# Patient Record
Sex: Female | Born: 1955 | Race: White | Hispanic: No | Marital: Married | Smoking: Former smoker
Health system: Southern US, Community
[De-identification: ages and names within clinical notes are randomized; demographics above are authoritative.]

## PROBLEM LIST (undated history)

## (undated) DIAGNOSIS — C801 Malignant (primary) neoplasm, unspecified: Secondary | ICD-10-CM

## (undated) DIAGNOSIS — K219 Gastro-esophageal reflux disease without esophagitis: Secondary | ICD-10-CM

## (undated) DIAGNOSIS — Z8489 Family history of other specified conditions: Secondary | ICD-10-CM

## (undated) HISTORY — PX: BREAST LUMPECTOMY: SHX2

## (undated) HISTORY — PX: OTHER SURGICAL HISTORY: SHX169

## (undated) HISTORY — DX: Malignant (primary) neoplasm, unspecified: C80.1

## (undated) HISTORY — PX: GANGLION CYST EXCISION: SHX1691

## (undated) HISTORY — PX: CARPAL TUNNEL RELEASE: SHX101

## (undated) HISTORY — PX: CHOLECYSTECTOMY: SHX55

---

## 2015-02-19 DIAGNOSIS — R718 Other abnormality of red blood cells: Secondary | ICD-10-CM | POA: Insufficient documentation

## 2016-01-20 DIAGNOSIS — E66811 Obesity, class 1: Secondary | ICD-10-CM | POA: Insufficient documentation

## 2016-08-16 DIAGNOSIS — Z8719 Personal history of other diseases of the digestive system: Secondary | ICD-10-CM | POA: Insufficient documentation

## 2016-08-17 DIAGNOSIS — D849 Immunodeficiency, unspecified: Secondary | ICD-10-CM | POA: Insufficient documentation

## 2020-05-06 DIAGNOSIS — L409 Psoriasis, unspecified: Secondary | ICD-10-CM | POA: Insufficient documentation

## 2020-05-06 DIAGNOSIS — K219 Gastro-esophageal reflux disease without esophagitis: Secondary | ICD-10-CM | POA: Insufficient documentation

## 2020-05-07 ENCOUNTER — Other Ambulatory Visit: Payer: Self-pay | Admitting: Internal Medicine

## 2020-05-07 DIAGNOSIS — R7989 Other specified abnormal findings of blood chemistry: Secondary | ICD-10-CM

## 2020-05-07 DIAGNOSIS — R945 Abnormal results of liver function studies: Secondary | ICD-10-CM

## 2020-05-12 ENCOUNTER — Ambulatory Visit: Payer: BC Managed Care – PPO

## 2020-05-27 ENCOUNTER — Ambulatory Visit
Admission: RE | Admit: 2020-05-27 | Discharge: 2020-05-27 | Disposition: A | Discharge status: Home / Self Care | Payer: BC Managed Care – PPO | Source: Ambulatory Visit | Attending: Internal Medicine | Admitting: Internal Medicine

## 2020-05-27 ENCOUNTER — Other Ambulatory Visit: Payer: Self-pay

## 2020-05-27 DIAGNOSIS — R7989 Other specified abnormal findings of blood chemistry: Secondary | ICD-10-CM

## 2020-05-27 DIAGNOSIS — R945 Abnormal results of liver function studies: Secondary | ICD-10-CM | POA: Insufficient documentation

## 2020-06-22 ENCOUNTER — Other Ambulatory Visit: Payer: Self-pay | Admitting: Internal Medicine

## 2020-06-22 DIAGNOSIS — N6342 Unspecified lump in left breast, subareolar: Secondary | ICD-10-CM | POA: Diagnosis not present

## 2020-06-23 ENCOUNTER — Inpatient Hospital Stay
Admission: RE | Admit: 2020-06-23 | Discharge: 2020-06-23 | Disposition: A | Discharge status: Home / Self Care | Payer: Self-pay | Source: Ambulatory Visit | Attending: *Deleted | Admitting: *Deleted

## 2020-06-23 ENCOUNTER — Other Ambulatory Visit: Payer: Self-pay | Admitting: Internal Medicine

## 2020-06-23 ENCOUNTER — Other Ambulatory Visit: Payer: Self-pay | Admitting: *Deleted

## 2020-06-23 DIAGNOSIS — Z1231 Encounter for screening mammogram for malignant neoplasm of breast: Secondary | ICD-10-CM

## 2020-06-23 DIAGNOSIS — N6342 Unspecified lump in left breast, subareolar: Secondary | ICD-10-CM

## 2020-06-25 ENCOUNTER — Other Ambulatory Visit: Payer: Self-pay

## 2020-06-25 ENCOUNTER — Ambulatory Visit
Admission: RE | Admit: 2020-06-25 | Discharge: 2020-06-25 | Disposition: A | Discharge status: Home / Self Care | Payer: BC Managed Care – PPO | Source: Ambulatory Visit | Attending: Internal Medicine | Admitting: Internal Medicine

## 2020-06-25 DIAGNOSIS — N6342 Unspecified lump in left breast, subareolar: Secondary | ICD-10-CM

## 2020-06-25 DIAGNOSIS — N6321 Unspecified lump in the left breast, upper outer quadrant: Secondary | ICD-10-CM | POA: Diagnosis not present

## 2020-06-25 DIAGNOSIS — R922 Inconclusive mammogram: Secondary | ICD-10-CM | POA: Diagnosis not present

## 2020-06-25 DIAGNOSIS — N6322 Unspecified lump in the left breast, upper inner quadrant: Secondary | ICD-10-CM | POA: Diagnosis not present

## 2020-06-30 ENCOUNTER — Other Ambulatory Visit: Payer: Self-pay | Admitting: Internal Medicine

## 2020-06-30 DIAGNOSIS — Z86010 Personal history of colonic polyps: Secondary | ICD-10-CM | POA: Diagnosis not present

## 2020-06-30 DIAGNOSIS — R7989 Other specified abnormal findings of blood chemistry: Secondary | ICD-10-CM | POA: Diagnosis not present

## 2020-06-30 DIAGNOSIS — R928 Other abnormal and inconclusive findings on diagnostic imaging of breast: Secondary | ICD-10-CM

## 2020-06-30 DIAGNOSIS — R112 Nausea with vomiting, unspecified: Secondary | ICD-10-CM | POA: Diagnosis not present

## 2020-06-30 DIAGNOSIS — N6320 Unspecified lump in the left breast, unspecified quadrant: Secondary | ICD-10-CM

## 2020-06-30 DIAGNOSIS — R1013 Epigastric pain: Secondary | ICD-10-CM | POA: Diagnosis not present

## 2020-07-05 ENCOUNTER — Ambulatory Visit
Admission: RE | Admit: 2020-07-05 | Discharge: 2020-07-05 | Disposition: A | Discharge status: Home / Self Care | Payer: BC Managed Care – PPO | Source: Ambulatory Visit | Attending: Internal Medicine | Admitting: Internal Medicine

## 2020-07-05 ENCOUNTER — Other Ambulatory Visit: Payer: Self-pay | Admitting: Internal Medicine

## 2020-07-05 ENCOUNTER — Other Ambulatory Visit: Payer: Self-pay

## 2020-07-05 DIAGNOSIS — R599 Enlarged lymph nodes, unspecified: Secondary | ICD-10-CM | POA: Insufficient documentation

## 2020-07-05 DIAGNOSIS — N6321 Unspecified lump in the left breast, upper outer quadrant: Secondary | ICD-10-CM | POA: Diagnosis not present

## 2020-07-05 DIAGNOSIS — N6342 Unspecified lump in left breast, subareolar: Secondary | ICD-10-CM | POA: Diagnosis not present

## 2020-07-05 DIAGNOSIS — N6320 Unspecified lump in the left breast, unspecified quadrant: Secondary | ICD-10-CM | POA: Diagnosis not present

## 2020-07-05 DIAGNOSIS — R928 Other abnormal and inconclusive findings on diagnostic imaging of breast: Secondary | ICD-10-CM | POA: Insufficient documentation

## 2020-07-05 DIAGNOSIS — R590 Localized enlarged lymph nodes: Secondary | ICD-10-CM | POA: Diagnosis not present

## 2020-07-05 DIAGNOSIS — C50812 Malignant neoplasm of overlapping sites of left female breast: Secondary | ICD-10-CM | POA: Diagnosis not present

## 2020-07-05 DIAGNOSIS — I898 Other specified noninfective disorders of lymphatic vessels and lymph nodes: Secondary | ICD-10-CM | POA: Diagnosis not present

## 2020-07-05 HISTORY — PX: BREAST BIOPSY: SHX20

## 2020-07-06 ENCOUNTER — Encounter: Payer: Self-pay | Admitting: *Deleted

## 2020-07-06 ENCOUNTER — Other Ambulatory Visit: Payer: Self-pay | Admitting: *Deleted

## 2020-07-06 DIAGNOSIS — C50912 Malignant neoplasm of unspecified site of left female breast: Secondary | ICD-10-CM

## 2020-07-06 NOTE — Progress Notes (Signed)
Received message from Electa Sniff, RN at [facility] that patient had been informed of her new diagnosis of invasive left breast cancer. Called patient to establish navigation services.  Patient had already been referred to Dr. Peyton Najjar for 07/27/20.  I have moved her appointment to 07/08/20 @ 1:30 and scheduled a medical oncology consult for 07/12/20 @ 1:30 with Dr. Janese Banks.  Will educational material at her consult.  She was encouraged to call with any questions or needs.

## 2020-07-08 DIAGNOSIS — C50412 Malignant neoplasm of upper-outer quadrant of left female breast: Secondary | ICD-10-CM | POA: Diagnosis not present

## 2020-07-12 ENCOUNTER — Inpatient Hospital Stay: Payer: BC Managed Care – PPO | Attending: Oncology | Admitting: Oncology

## 2020-07-12 ENCOUNTER — Inpatient Hospital Stay: Payer: BC Managed Care – PPO

## 2020-07-12 ENCOUNTER — Encounter: Payer: Self-pay | Admitting: *Deleted

## 2020-07-12 ENCOUNTER — Encounter: Payer: Self-pay | Admitting: Oncology

## 2020-07-12 ENCOUNTER — Other Ambulatory Visit: Payer: Self-pay | Admitting: *Deleted

## 2020-07-12 VITALS — Temp 97.4°F | Resp 20 | Ht 62.0 in | Wt 187.7 lb

## 2020-07-12 DIAGNOSIS — Z9225 Personal history of immunosupression therapy: Secondary | ICD-10-CM | POA: Diagnosis not present

## 2020-07-12 DIAGNOSIS — Z298 Encounter for other specified prophylactic measures: Secondary | ICD-10-CM | POA: Diagnosis not present

## 2020-07-12 DIAGNOSIS — Z170 Estrogen receptor positive status [ER+]: Secondary | ICD-10-CM

## 2020-07-12 DIAGNOSIS — C50912 Malignant neoplasm of unspecified site of left female breast: Secondary | ICD-10-CM

## 2020-07-12 DIAGNOSIS — M654 Radial styloid tenosynovitis [de Quervain]: Secondary | ICD-10-CM | POA: Insufficient documentation

## 2020-07-12 DIAGNOSIS — Z5112 Encounter for antineoplastic immunotherapy: Secondary | ICD-10-CM | POA: Diagnosis not present

## 2020-07-12 DIAGNOSIS — Z79899 Other long term (current) drug therapy: Secondary | ICD-10-CM | POA: Diagnosis not present

## 2020-07-12 DIAGNOSIS — Z5111 Encounter for antineoplastic chemotherapy: Secondary | ICD-10-CM | POA: Diagnosis not present

## 2020-07-12 DIAGNOSIS — C50412 Malignant neoplasm of upper-outer quadrant of left female breast: Secondary | ICD-10-CM

## 2020-07-12 DIAGNOSIS — Z87891 Personal history of nicotine dependence: Secondary | ICD-10-CM

## 2020-07-12 DIAGNOSIS — Z7189 Other specified counseling: Secondary | ICD-10-CM

## 2020-07-12 DIAGNOSIS — Z5189 Encounter for other specified aftercare: Secondary | ICD-10-CM | POA: Diagnosis not present

## 2020-07-12 DIAGNOSIS — M25539 Pain in unspecified wrist: Secondary | ICD-10-CM | POA: Insufficient documentation

## 2020-07-12 LAB — SURGICAL PATHOLOGY

## 2020-07-12 MED ORDER — LORAZEPAM 0.5 MG PO TABS: 0.5000 mg | ORAL_TABLET | Freq: Four times a day (QID) | ORAL | 0 refills | Status: DC | PRN

## 2020-07-12 MED ORDER — ONDANSETRON HCL 8 MG PO TABS: 8.0000 mg | ORAL_TABLET | Freq: Two times a day (BID) | ORAL | 1 refills | Status: DC | PRN

## 2020-07-12 MED ORDER — DEXAMETHASONE 4 MG PO TABS: 8.0000 mg | ORAL_TABLET | Freq: Two times a day (BID) | ORAL | 1 refills | Status: DC

## 2020-07-12 MED ORDER — PROCHLORPERAZINE MALEATE 10 MG PO TABS: 10.0000 mg | ORAL_TABLET | Freq: Four times a day (QID) | ORAL | 1 refills | Status: DC | PRN

## 2020-07-12 MED ORDER — LIDOCAINE-PRILOCAINE 2.5-2.5 % EX CREA: TOPICAL_CREAM | CUTANEOUS | 3 refills | Status: DC

## 2020-07-12 NOTE — Progress Notes (Signed)
Patient here today for initial evaluation regarding breast cancer.  

## 2020-07-12 NOTE — Progress Notes (Signed)
START ON PATHWAY REGIMEN - Breast     A cycle is every 21 days:     Pertuzumab      Pertuzumab      Trastuzumab-xxxx      Trastuzumab-xxxx      Carboplatin      Docetaxel   **Always confirm dose/schedule in your pharmacy ordering system**  Patient Characteristics: Preoperative or Nonsurgical Candidate (Clinical Staging), Neoadjuvant Therapy followed by Surgery, Invasive Disease, Chemotherapy, HER2 Positive, ER Positive Therapeutic Status: Preoperative or Nonsurgical Candidate (Clinical Staging) AJCC M Category: cM0 AJCC Grade: G3 Breast Surgical Plan: Neoadjuvant Therapy followed by Surgery ER Status: Positive (+) AJCC 8 Stage Grouping: IIA HER2 Status: Positive (+) AJCC T Category: cT2 AJCC N Category: cN0 PR Status: Negative (-) Intent of Therapy: Curative Intent, Discussed with Patient 

## 2020-07-12 NOTE — Progress Notes (Signed)
Met patient and her husband today during her initial medical oncology consult with Dr. Janese Banks.  Dr. Janese Banks discussed neoadjuvant chemotherapy with Taxotere/Carbo/Herceptin/Perjeta.  Patient given breast cancer educational literature, "My Breast Cancer Treatment Handbook" by Josephine Igo, RN at her appointment with Dr. Peyton Najjar.  Encouraged patient to call with any questions or needs.  She is going to [place] this weekend and to start treatment on 07/23/20.

## 2020-07-12 NOTE — Progress Notes (Unsigned)
Mri breas

## 2020-07-12 NOTE — Progress Notes (Addendum)
Hematology/Oncology Consult note [facility] Telephone:([phone] [phone]  Patient Care Team: Kirk Ruths, MD as PCP - General (Internal Medicine) Rico Junker, RN as Oncology Nurse Navigator   Name of the patient: Wendy Caldwell  [id number]  05/26/1955    Reason for referral-new diagnosis of left breast cancer clinical prognostic stage II acT2 N0 M0 ER weakly positive, PR negative HER2 positive   Referring physician-Dr. Ouida Sills  Date of visit: 07/12/20   History of presenting illness- Patient is a 65 year old female with a past medical history significant for psoriasis for which she is on Humira.  She self palpated a left breast mass which prompted a bilateral diagnostic mammogram on 06/25/2020.  That showed an irregular hypoechoic mass 2.5 x 1.7 x 1.8 cm at 1 o'clock position 1 cm from the nipple.  3 cm from the nipple were benign cysts.  1 mildly enlarged left axillary lymph node with cortical thickening 4 mm.  Both the breast mass and the lymph node were biopsied.  Left breast biopsy was consistent with invasive mammary carcinoma grade 3 ER weakly +1 to 10%, PR negative and HER2 positive by IHC.  Patient is here to discuss further management.  She has already seen Dr. Peyton Najjar and options of both lumpectomy and mastectomy have been discussed with her.  Patient does not know her family history as she was adopted.  She married 2 months ago.  Menarche at the age of 34.  She is to adult daughters.  First child in her 39s.  She was previously using birth control.  Last menstrual period in her mid 64s.  No use of hormonal birth placement therapy.  She has had a breast biopsy years ago for right breast cysts.  ECOG PS- 0  Pain scale- 0   Review of systems- Review of Systems  Constitutional: Negative for chills, fever, malaise/fatigue and weight loss.  HENT: Negative for congestion, ear discharge and nosebleeds.   Eyes: Negative for  blurred vision.  Respiratory: Negative for cough, hemoptysis, sputum production, shortness of breath and wheezing.   Cardiovascular: Negative for chest pain, palpitations, orthopnea and claudication.  Gastrointestinal: Negative for abdominal pain, blood in stool, constipation, diarrhea, heartburn, melena, nausea and vomiting.  Genitourinary: Negative for dysuria, flank pain, frequency, hematuria and urgency.  Musculoskeletal: Negative for back pain, joint pain and myalgias.  Skin: Negative for rash.  Neurological: Negative for dizziness, tingling, focal weakness, seizures, weakness and headaches.  Endo/Heme/Allergies: Does not bruise/bleed easily.  Psychiatric/Behavioral: Negative for depression and suicidal ideas. The patient does not have insomnia.     No Known Allergies  Patient Active Problem List   Diagnosis Date Noted  . Wrist joint pain 07/12/2020  . Radial styloid tenosynovitis 07/12/2020  . Goals of care, counseling/discussion 07/12/2020  . Malignant neoplasm of upper-outer quadrant of left breast in female, estrogen receptor positive ([place]) 07/12/2020  . GERD (gastroesophageal reflux disease) 05/06/2020  . Psoriasis 05/06/2020  . Immunosuppression ([place]) 08/17/2016  . History of esophageal stricture 08/16/2016  . Severe obesity (BMI 35.0-39.9) with comorbidity ([place]) 01/20/2016     History reviewed. No pertinent past medical history.   Past Surgical History:  Procedure Laterality Date  . BREAST BIOPSY Left 07/05/2020   [place] Bx, Q clip, path pending  . BREAST BIOPSY Left 07/05/2020   [place] Bx, Vision, path pending   . CHOLECYSTECTOMY    . right ovary removal      Social History   Socioeconomic History  . Marital  status: Married    Spouse name: Not on file  . Number of children: Not on file  . Years of education: Not on file  . Highest education level: Not on file  Occupational History  . Not on file  Tobacco Use  . Smoking status: Former Smoker    Start date:  2012  . Smokeless tobacco: Not on file  Substance and Sexual Activity  . Alcohol use: Yes    Comment: occassional   . Drug use: Not on file  . Sexual activity: Not on file  Other Topics Concern  . Not on file  Social History Narrative  . Not on file   Social Determinants of Health   Financial Resource Strain: Not on file  Food Insecurity: Not on file  Transportation Needs: Not on file  Physical Activity: Not on file  Stress: Not on file  Social Connections: Not on file  Intimate Partner Violence: Not on file     Family History  Problem Relation Age of Onset  . Breast cancer Neg Hx      Current Outpatient Medications:  .  Adalimumab (HUMIRA) 40 MG/0.8ML PSKT, Humira 40 mg/0.8 mL subcutaneous syringe kit   40 mg every 2 weeks by sub-q route., Disp: , Rfl:  .  Ascorbic Acid (VITAMIN C) 1000 MG tablet, Take 1,000 mg by mouth daily., Disp: , Rfl:  .  loratadine (CLARITIN) 10 MG tablet, Take 10 mg by mouth daily., Disp: , Rfl:  .  Multiple Vitamins-Minerals (MULTIVITAMIN WITH MINERALS) tablet, Take 1 tablet by mouth daily., Disp: , Rfl:  .  omeprazole (PRILOSEC) 40 MG capsule, Take 40 mg by mouth daily., Disp: , Rfl:    Physical exam:  Vitals:   07/12/20 1353  Resp: 20  Temp: (!) 97.4 F (36.3 C)  TempSrc: Tympanic  Weight: 187 lb 11.2 oz (85.1 kg)   Physical Exam Constitutional:      General: She is not in acute distress. Cardiovascular:     Rate and Rhythm: Normal rate.     Heart sounds: Normal heart sounds.  Pulmonary:     Effort: Pulmonary effort is normal.  Abdominal:     General: Bowel sounds are normal.     Palpations: Abdomen is soft.  Skin:    General: Skin is warm and dry.  Neurological:     Mental Status: She is alert and oriented to person, place, and time.   Breast exam: Performed in sitting and laying down position.  Palpable left breast mass 1 cm from the nipple about 2 cm in size.  No palpable left axillary adenopathy.  Palpable left breast cyst  3 cm from the nipple.Left nipple retraction noted    No flowsheet data found. No flowsheet data found.  No images are attached to the encounter.  US BREAST LTD UNI LEFT INC AXILLA  Result Date: 06/25/2020 CLINICAL DATA:  Here for evaluation of a palpable area felt by the patient in the left breast. EXAM: DIGITAL DIAGNOSTIC BILATERAL MAMMOGRAM WITH TOMOSYNTHESIS AND CAD; ULTRASOUND LEFT BREAST LIMITED TECHNIQUE: Bilateral digital diagnostic mammography and breast tomosynthesis was performed. The images were evaluated with computer-aided detection.; Targeted ultrasound examination of the left breast was performed COMPARISON:  Previous exam(s). ACR Breast Density Category c: The breast tissue is heterogeneously dense, which may obscure small masses. FINDINGS: Additional views including spot compression demonstrate an irregular 2.3 cm mass with associated microcalcifications in the upper outer retroareolar left breast at anterior depth. This underlies a palpable skin marker.  The mass extends to the base of the nipple in there is associated nipple retraction. Three adjacent 4 mm masses are seen in the upper inner left breast at anterior depth. No suspicious mass, microcalcification, or other finding is identified in the right breast. Targeted left breast ultrasound was performed. At 1 o'clock 1 cm from the nipple an irregular hypoechoic mass measures 2.5 x 1.7 x 1.8 cm. This corresponds to the palpable area felt by the patient and the mass seen on the mammogram. This mass extends to the base of the nipple and there is nipple retraction on physical exam. At 10:30 o'clock 3 cm from the nipple three nearby benign cysts measure 3-4 mm in diameter and correspond to the 3 masses seen on the mammogram. One mildly enlarged left axillary lymph node demonstrates cortical thickening measuring 4 mm. IMPRESSION: 1. Irregular mass in the left breast and a mildly enlarged left axillary lymph node are suspicious for  malignancy and metastatic disease. 2.  No evidence of malignancy in the right breast. RECOMMENDATION: Recommend ultrasound-guided biopsy of the mass in the left breast and ultrasound-guided biopsy of the mildly enlarged lymph node in the left axilla. I have discussed the findings and recommendations with the patient. If applicable, a reminder letter will be sent to the patient regarding the next appointment. BI-RADS CATEGORY  4: Suspicious. Electronically Signed   By: Zerita Boers M.D.   On: 06/25/2020 11:54   MM DIAG BREAST TOMO BILATERAL  Result Date: 06/25/2020 CLINICAL DATA:  Here for evaluation of a palpable area felt by the patient in the left breast. EXAM: DIGITAL DIAGNOSTIC BILATERAL MAMMOGRAM WITH TOMOSYNTHESIS AND CAD; ULTRASOUND LEFT BREAST LIMITED TECHNIQUE: Bilateral digital diagnostic mammography and breast tomosynthesis was performed. The images were evaluated with computer-aided detection.; Targeted ultrasound examination of the left breast was performed COMPARISON:  Previous exam(s). ACR Breast Density Category c: The breast tissue is heterogeneously dense, which may obscure small masses. FINDINGS: Additional views including spot compression demonstrate an irregular 2.3 cm mass with associated microcalcifications in the upper outer retroareolar left breast at anterior depth. This underlies a palpable skin marker. The mass extends to the base of the nipple in there is associated nipple retraction. Three adjacent 4 mm masses are seen in the upper inner left breast at anterior depth. No suspicious mass, microcalcification, or other finding is identified in the right breast. Targeted left breast ultrasound was performed. At 1 o'clock 1 cm from the nipple an irregular hypoechoic mass measures 2.5 x 1.7 x 1.8 cm. This corresponds to the palpable area felt by the patient and the mass seen on the mammogram. This mass extends to the base of the nipple and there is nipple retraction on physical exam. At  10:30 o'clock 3 cm from the nipple three nearby benign cysts measure 3-4 mm in diameter and correspond to the 3 masses seen on the mammogram. One mildly enlarged left axillary lymph node demonstrates cortical thickening measuring 4 mm. IMPRESSION: 1. Irregular mass in the left breast and a mildly enlarged left axillary lymph node are suspicious for malignancy and metastatic disease. 2.  No evidence of malignancy in the right breast. RECOMMENDATION: Recommend ultrasound-guided biopsy of the mass in the left breast and ultrasound-guided biopsy of the mildly enlarged lymph node in the left axilla. I have discussed the findings and recommendations with the patient. If applicable, a reminder letter will be sent to the patient regarding the next appointment. BI-RADS CATEGORY  4: Suspicious. Electronically Signed   By:  Zerita Boers M.D.   On: 06/25/2020 11:54   MM CLIP PLACEMENT LEFT  Result Date: 07/05/2020 CLINICAL DATA:  Evaluate post biopsy marker clip placement following ultrasound-guided core needle biopsy of a retroareolar left breast mass and a left axillary lymph node. EXAM: DIAGNOSTIC LEFT MAMMOGRAM POST ULTRASOUND BIOPSY COMPARISON:  Previous exam(s). FINDINGS: Mammographic images were obtained following ultrasound guided biopsy of a retroareolar left breast mass and a left axillary lymph node. The Q shaped biopsy clip lies within the retroareolar mass. The vision clip, placed within the left axillary lymph node, cannot be visualized mammographically. IMPRESSION: Appropriate positioning of the type shaped biopsy marking clip at the site of biopsy in the Q retroareolar left breast. Final Assessment: Post Procedure Mammograms for Marker Placement Electronically Signed   By: Lajean Manes M.D.   On: 07/05/2020 09:06   [place] LT BREAST BX W LOC DEV 1ST LESION IMG BX SPEC US GUIDE  Addendum Date: 07/09/2020   ADDENDUM REPORT: 07/09/2020 12:48 ADDENDUM: PATHOLOGY revealed: Site A. LEFT BREAST; ULTRASOUND-GUIDED  BIOPSY: - INVASIVE MAMMARY CARCINOMA, NO SPECIAL TYPE. Size of invasive carcinoma: 13 mm in this sample. Grade 3. Ductal carcinoma in situ: Not identified. Lymphovascular invasion: Not identified. Comment: The definitive grade will be assigned on the excisional specimen. Pathology results are CONCORDANT with imaging findings, per Dr. Lajean Manes. PATHOLOGY revealed: Site B. LYMPH NODE, LEFT AXILLARY; ULTRASOUND-GUIDED BIOPSY: - CORES OF BENIGN LYMPH NODE, NEGATIVE FOR MALIGNANCY IN THIS SAMPLE. Pathology results are CONCORDANT with imaging findings, per Dr. Lajean Manes. Pathology results and recommendations below were discussed with patient by telephone on 07/06/2020. Patient reported biopsy site within normal limits with slight tenderness at the site. Post biopsy care instructions were reviewed, questions were answered and my direct phone number was provided to patient. Patient was instructed to call [facility] if any concerns or questions arise related to the biopsy. Recommend surgical consultation for Site A only (LEFT breast mass). Notified Al Pimple RN and Tanya Nones RN at [facility] on 07/06/2020, that patient requests her provider (Dr. Frazier Richards) make her surgical consultation. Pathology results reported by Electa Sniff RN on 07/09/2020. Electronically Signed   By: Lajean Manes M.D.   On: 07/09/2020 12:48   Result Date: 07/09/2020 CLINICAL DATA:  Patient presents for ultrasound-guided core needle biopsy of a retroareolar left breast mass and a single borderline abnormal left axillary lymph node. EXAM: ULTRASOUND GUIDED LEFT BREAST CORE NEEDLE BIOPSY ULTRASOUND GUIDED LEFT AXILLARY LYMPH NODE CORE NEEDLE BIOPSY COMPARISON:  Previous exam(s). PROCEDURE: I met with the patient and we discussed the procedure of ultrasound-guided biopsy, including benefits and alternatives. We discussed the high likelihood of a successful procedure. We discussed the risks of the  procedure, including infection, bleeding, tissue injury, clip migration, and inadequate sampling. Informed written consent was given. The usual time-out protocol was performed immediately prior to the procedure. Biopsy #1: 2.5 cm mass at 1 o'clock, 1 cm from the nipple. Lesion quadrant: Upper outer quadrant Using sterile technique and 1% Lidocaine as local anesthetic, under direct ultrasound visualization, a 12 gauge spring-loaded device was used to perform biopsy of retroareolar left breast mass using a medial approach. At the conclusion of the procedure Q shaped tissue marker clip was deployed into the biopsy cavity. Biopsy #2: Left axillary lymph node. Lesion location: Left axilla Using sterile technique and 1% Lidocaine as local anesthetic, under direct ultrasound visualization, a 14 gauge spring-loaded device was used to perform biopsy of the lymph  node with the 4 mm mildly thickened cortex using an inferior approach. At the conclusion of the procedure vision tissue marker clip was deployed into the biopsy cavity. Follow up 2 view mammogram was performed and dictated separately. IMPRESSION: Ultrasound guided biopsy of a retroareolar left breast mass and a single left axillary lymph node. No apparent complications. Electronically Signed: By: Lajean Manes M.D. On: 07/05/2020 09:11   [place] LT BREAST BX W LOC DEV EA ADD LESION IMG BX SPEC US GUIDE  Addendum Date: 07/09/2020   ADDENDUM REPORT: 07/09/2020 12:48 ADDENDUM: PATHOLOGY revealed: Site A. LEFT BREAST; ULTRASOUND-GUIDED BIOPSY: - INVASIVE MAMMARY CARCINOMA, NO SPECIAL TYPE. Size of invasive carcinoma: 13 mm in this sample. Grade 3. Ductal carcinoma in situ: Not identified. Lymphovascular invasion: Not identified. Comment: The definitive grade will be assigned on the excisional specimen. Pathology results are CONCORDANT with imaging findings, per Dr. Lajean Manes. PATHOLOGY revealed: Site B. LYMPH NODE, LEFT AXILLARY; ULTRASOUND-GUIDED BIOPSY: - CORES OF  BENIGN LYMPH NODE, NEGATIVE FOR MALIGNANCY IN THIS SAMPLE. Pathology results are CONCORDANT with imaging findings, per Dr. Lajean Manes. Pathology results and recommendations below were discussed with patient by telephone on 07/06/2020. Patient reported biopsy site within normal limits with slight tenderness at the site. Post biopsy care instructions were reviewed, questions were answered and my direct phone number was provided to patient. Patient was instructed to call [facility] if any concerns or questions arise related to the biopsy. Recommend surgical consultation for Site A only (LEFT breast mass). Notified Al Pimple RN and Tanya Nones RN at [facility] on 07/06/2020, that patient requests her provider (Dr. Frazier Richards) make her surgical consultation. Pathology results reported by Electa Sniff RN on 07/09/2020. Electronically Signed   By: Lajean Manes M.D.   On: 07/09/2020 12:48   Result Date: 07/09/2020 CLINICAL DATA:  Patient presents for ultrasound-guided core needle biopsy of a retroareolar left breast mass and a single borderline abnormal left axillary lymph node. EXAM: ULTRASOUND GUIDED LEFT BREAST CORE NEEDLE BIOPSY ULTRASOUND GUIDED LEFT AXILLARY LYMPH NODE CORE NEEDLE BIOPSY COMPARISON:  Previous exam(s). PROCEDURE: I met with the patient and we discussed the procedure of ultrasound-guided biopsy, including benefits and alternatives. We discussed the high likelihood of a successful procedure. We discussed the risks of the procedure, including infection, bleeding, tissue injury, clip migration, and inadequate sampling. Informed written consent was given. The usual time-out protocol was performed immediately prior to the procedure. Biopsy #1: 2.5 cm mass at 1 o'clock, 1 cm from the nipple. Lesion quadrant: Upper outer quadrant Using sterile technique and 1% Lidocaine as local anesthetic, under direct ultrasound visualization, a 12 gauge spring-loaded device was  used to perform biopsy of retroareolar left breast mass using a medial approach. At the conclusion of the procedure Q shaped tissue marker clip was deployed into the biopsy cavity. Biopsy #2: Left axillary lymph node. Lesion location: Left axilla Using sterile technique and 1% Lidocaine as local anesthetic, under direct ultrasound visualization, a 14 gauge spring-loaded device was used to perform biopsy of the lymph node with the 4 mm mildly thickened cortex using an inferior approach. At the conclusion of the procedure vision tissue marker clip was deployed into the biopsy cavity. Follow up 2 view mammogram was performed and dictated separately. IMPRESSION: Ultrasound guided biopsy of a retroareolar left breast mass and a single left axillary lymph node. No apparent complications. Electronically Signed: By: Lajean Manes M.D. On: 07/05/2020 09:11   MM Outside Films Mammo  Result Date: 06/29/2020 This examination belongs to an outside facility and is stored here for comparison purposes only.  Contact the originating outside institution for any associated report or interpretation.  MM Outside Films Mammo  Result Date: 06/29/2020 This examination belongs to an outside facility and is stored here for comparison purposes only.  Contact the originating outside institution for any associated report or interpretation.  MM Outside Films Mammo  Result Date: 06/29/2020 This examination belongs to an outside facility and is stored here for comparison purposes only.  Contact the originating outside institution for any associated report or interpretation.   Assessment and plan- Patient is a 65 y.o. female with newly diagnosed clinical prognostic stage IIa invasive mammary carcinoma of the left breast cT2 N0 M0 ER weakly +1 to 10%, PR negative and HER2 positive here to discuss further management  Discussed the results of the biopsy as well as mammogram and ultrasound with the patient in detail which showed a 2.5  cm left breast mass that was ER weakly positive, PR negative and HER2 positive.  There was 1 concerning left axillary lymph node which was biopsied proven negative for malignancy.  At this time I would like to get an MRI bilateral breasts with and without contrast to a certain the extent of the disease.  Given that her tumor is more than 2 cm and behaving more like ER/PR negative given the low ER positivity and HER2 positive-I would recommend neoadjuvant chemotherapy for her.  I recommend 6 cycles of neoadjuvant Taxotere along with carboplatin Herceptin and Perjeta given IV every 3 weeks for 6 cycles.  Discussed risks and benefits of chemotherapy including all but not limited to nausea, vomiting, low blood counts, risk of infections and hospitalizations.  Risk of hair loss, peripheral neuropathy and infusion reaction associated with carboplatin and docetaxel.  Discussed risks and benefits of Herceptin and Perjeta including all but not limited to diarrhea, skin rash and possible risk of cardiotoxicity.  Discussed the need to get a baseline echocardiogram prior to starting treatment.  Treatment will be given with a curative intent.  Patient understands and agrees to proceed as planned.  We will plan to get port placed by Dr. Peyton Najjar, chemo teach as well as echocardiogram and tentatively plan to start chemotherapy in 2 weeks.  Discussed that the goal of neoadjuvant chemotherapy would be to achieve a pathological complete response and if we do that she will continue Herceptin and Perjeta every 3 weeks after surgery for 1 year.  If we do not achieve a pathological complete response consideration will be given to switch her to Kadcyla every 3 weeks for a year.  Patient would also benefit from adjuvant radiation therapy upon completion of neoadjuvant chemotherapy and definitive surgery.  Given that her tumor is weakly ER positive there would be a role for hormone therapy down the line which I will discuss with  her in greater detail upon completion of radiation treatment.  I also asked the patient to speak to her rheumatologist/dermatologist about stopping Humira during chemotherapy for her psoriasis  Patient and her husband had multiple insightful questions and they were all answered to their satisfaction  Cancer Staging Malignant neoplasm of upper-outer quadrant of left breast in female, estrogen receptor positive ([place]) Staging form: Breast, AJCC 8th Edition - Clinical stage from 07/12/2020: Stage IIA (cT2, cN0, cM0, G3, ER+, PR-, HER2+) - Signed by Sindy Guadeloupe, MD on 07/12/2020 Stage prefix: Initial diagnosis Histologic grading system: 3 grade system  Thank you for this kind referral and the opportunity to participate in the care of this patient   Visit Diagnosis 1. Malignant neoplasm of upper-outer quadrant of left breast in female, estrogen receptor positive ([place])   2. High risk medication use   3. Goals of care, counseling/discussion     Dr. Randa Evens, MD, MPH [facility] [phone] 07/12/2020 4:30 PM

## 2020-07-13 ENCOUNTER — Encounter: Payer: Self-pay | Admitting: *Deleted

## 2020-07-13 ENCOUNTER — Encounter: Payer: Self-pay | Admitting: Licensed Clinical Social Worker

## 2020-07-13 NOTE — Progress Notes (Signed)
Patient wanted clarification of order for Decadron pre-chemo.  Reviewed ordered.  She questioned no p.o. Decadron the day of chemo.  Informed patient it usually given IV the day of chemo.  Encouraged her to confirm with Rosa at chemo class, who will have access to her treatment plan.  She is agreeable.

## 2020-07-14 NOTE — Patient Instructions (Signed)
Trastuzumab injection for infusion What is this medicine? TRASTUZUMAB (tras TOO zoo mab) is a monoclonal antibody. It is used to treat breast cancer and stomach cancer. This medicine may be used for other purposes; ask your health care provider or pharmacist if you have questions. COMMON BRAND NAME(S): Herceptin, Herzuma, KANJINTI, Ogivri, Ontruzant, Trazimera What should I tell my health care provider before I take this medicine? They need to know if you have any of these conditions:  heart disease  heart failure  lung or breathing disease, like asthma  an unusual or allergic reaction to trastuzumab, benzyl alcohol, or other medications, foods, dyes, or preservatives  pregnant or trying to get pregnant  breast-feeding How should I use this medicine? This drug is given as an infusion into a vein. It is administered in a hospital or clinic by a specially trained health care professional. Talk to your pediatrician regarding the use of this medicine in children. This medicine is not approved for use in children. Overdosage: If you think you have taken too much of this medicine contact a poison control center or emergency room at once. NOTE: This medicine is only for you. Do not share this medicine with others. What if I miss a dose? It is important not to miss a dose. Call your doctor or health care professional if you are unable to keep an appointment. What may interact with this medicine? This medicine may interact with the following medications:  certain types of chemotherapy, such as daunorubicin, doxorubicin, epirubicin, and idarubicin This list may not describe all possible interactions. Give your health care provider a list of all the medicines, herbs, non-prescription drugs, or dietary supplements you use. Also tell them if you smoke, drink alcohol, or use illegal drugs. Some items may interact with your medicine. What should I watch for while using this medicine? Visit your  doctor for checks on your progress. Report any side effects. Continue your course of treatment even though you feel ill unless your doctor tells you to stop. Call your doctor or health care professional for advice if you get a fever, chills or sore throat, or other symptoms of a cold or flu. Do not treat yourself. Try to avoid being around people who are sick. You may experience fever, chills and shaking during your first infusion. These effects are usually mild and can be treated with other medicines. Report any side effects during the infusion to your health care professional. Fever and chills usually do not happen with later infusions. Do not become pregnant while taking this medicine or for 7 months after stopping it. Women should inform their doctor if they wish to become pregnant or think they might be pregnant. Women of child-bearing potential will need to have a negative pregnancy test before starting this medicine. There is a potential for serious side effects to an unborn child. Talk to your health care professional or pharmacist for more information. Do not breast-feed an infant while taking this medicine or for 7 months after stopping it. Women must use effective birth control with this medicine. What side effects may I notice from receiving this medicine? Side effects that you should report to your doctor or health care professional as soon as possible:  allergic reactions like skin rash, itching or hives, swelling of the face, lips, or tongue  chest pain or palpitations  cough  dizziness  feeling faint or lightheaded, falls  fever  general ill feeling or flu-like symptoms  signs of worsening heart failure like   breathing problems; swelling in your legs and feet  unusually weak or tired Side effects that usually do not require medical attention (report to your doctor or health care professional if they continue or are bothersome):  bone pain  changes in  taste  diarrhea  joint pain  nausea/vomiting  weight loss This list may not describe all possible side effects. Call your doctor for medical advice about side effects. You may report side effects to FDA at 1-800-FDA-1088. Where should I keep my medicine? This drug is given in a hospital or clinic and will not be stored at home. NOTE: This sheet is a summary. It may not cover all possible information. If you have questions about this medicine, talk to your doctor, pharmacist, or health care provider.  2021 Elsevier/Gold Standard (2016-02-22 14:37:52) Pertuzumab injection What is this medicine? PERTUZUMAB (per TOOZ ue mab) is a monoclonal antibody. It is used to treat breast cancer. This medicine may be used for other purposes; ask your health care provider or pharmacist if you have questions. COMMON BRAND NAME(S): PERJETA What should I tell my health care provider before I take this medicine? They need to know if you have any of these conditions:  heart disease  heart failure  high blood pressure  history of irregular heart beat  recent or ongoing radiation therapy  an unusual or allergic reaction to pertuzumab, other medicines, foods, dyes, or preservatives  pregnant or trying to get pregnant  breast-feeding How should I use this medicine? This medicine is for infusion into a vein. It is given by a health care professional in a hospital or clinic setting. Talk to your pediatrician regarding the use of this medicine in children. Special care may be needed. Overdosage: If you think you have taken too much of this medicine contact a poison control center or emergency room at once. NOTE: This medicine is only for you. Do not share this medicine with others. What if I miss a dose? It is important not to miss your dose. Call your doctor or health care professional if you are unable to keep an appointment. What may interact with this medicine? Interactions are not  expected. Give your health care provider a list of all the medicines, herbs, non-prescription drugs, or dietary supplements you use. Also tell them if you smoke, drink alcohol, or use illegal drugs. Some items may interact with your medicine. This list may not describe all possible interactions. Give your health care provider a list of all the medicines, herbs, non-prescription drugs, or dietary supplements you use. Also tell them if you smoke, drink alcohol, or use illegal drugs. Some items may interact with your medicine. What should I watch for while using this medicine? Your condition will be monitored carefully while you are receiving this medicine. Report any side effects. Continue your course of treatment even though you feel ill unless your doctor tells you to stop. Do not become pregnant while taking this medicine or for 7 months after stopping it. Women should inform their doctor if they wish to become pregnant or think they might be pregnant. Women of child-bearing potential will need to have a negative pregnancy test before starting this medicine. There is a potential for serious side effects to an unborn child. Talk to your health care professional or pharmacist for more information. Do not breast-feed an infant while taking this medicine or for 7 months after stopping it. Women must use effective birth control with this medicine. Call your doctor or health  health care professional for advice if you get a fever, chills or sore throat, or other symptoms of a cold or flu. Do not treat yourself. Try to avoid being around people who are sick. You may experience fever, chills, and headache during the infusion. Report any side effects during the infusion to your health care professional. What side effects may I notice from receiving this medicine? Side effects that you should report to your doctor or health care professional as soon as possible:  breathing problems  chest pain or  palpitations  dizziness  feeling faint or lightheaded  fever or chills  skin rash, itching or hives  sore throat  swelling of the face, lips, or tongue  swelling of the legs or ankles  unusually weak or tired Side effects that usually do not require medical attention (report to your doctor or health care professional if they continue or are bothersome):  diarrhea  hair loss  nausea, vomiting  tiredness This list may not describe all possible side effects. Call your doctor for medical advice about side effects. You may report side effects to FDA at 1-800-FDA-1088. Where should I keep my medicine? This drug is given in a hospital or clinic and will not be stored at home. NOTE: This sheet is a summary. It may not cover all possible information. If you have questions about this medicine, talk to your doctor, pharmacist, or health care provider.  2021 Elsevier/Gold Standard (2015-04-01 12:08:50)  Docetaxel injection What is this medicine? DOCETAXEL (doe se TAX el) is a chemotherapy drug. It targets fast dividing cells, like cancer cells, and causes these cells to die. This medicine is used to treat many types of cancers like breast cancer, certain stomach cancers, head and neck cancer, lung cancer, and prostate cancer. This medicine may be used for other purposes; ask your health care provider or pharmacist if you have questions. COMMON BRAND NAME(S): Docefrez, Taxotere What should I tell my health care provider before I take this medicine? They need to know if you have any of these conditions:  infection (especially a virus infection such as chickenpox, cold sores, or herpes)  liver disease  low blood counts, like low white cell, platelet, or red cell counts  an unusual or allergic reaction to docetaxel, polysorbate 80, other chemotherapy agents, other medicines, foods, dyes, or preservatives  pregnant or trying to get pregnant  breast-feeding How should I use this  medicine? This drug is given as an infusion into a vein. It is administered in a hospital or clinic by a specially trained health care professional. Talk to your pediatrician regarding the use of this medicine in children. Special care may be needed. Overdosage: If you think you have taken too much of this medicine contact a poison control center or emergency room at once. NOTE: This medicine is only for you. Do not share this medicine with others. What if I miss a dose? It is important not to miss your dose. Call your doctor or health care professional if you are unable to keep an appointment. What may interact with this medicine? Do not take this medicine with any of the following medications:  live virus vaccines This medicine may also interact with the following medications:  aprepitant  certain antibiotics like erythromycin or clarithromycin  certain antivirals for HIV or hepatitis  certain medicines for fungal infections like fluconazole, itraconazole, ketoconazole, posaconazole, or voriconazole  cimetidine  ciprofloxacin  conivaptan  cyclosporine  dronedarone  fluvoxamine  grapefruit juice  imatinib    verapamil This list may not describe all possible interactions. Give your health care provider a list of all the medicines, herbs, non-prescription drugs, or dietary supplements you use. Also tell them if you smoke, drink alcohol, or use illegal drugs. Some items may interact with your medicine. What should I watch for while using this medicine? Your condition will be monitored carefully while you are receiving this medicine. You will need important blood work done while you are taking this medicine. Call your doctor or health care professional for advice if you get a fever, chills or sore throat, or other symptoms of a cold or flu. Do not treat yourself. This drug decreases your body's ability to fight infections. Try to avoid being around people who are sick. Some  products may contain alcohol. Ask your health care professional if this medicine contains alcohol. Be sure to tell all health care professionals you are taking this medicine. Certain medicines, like metronidazole and disulfiram, can cause an unpleasant reaction when taken with alcohol. The reaction includes flushing, headache, nausea, vomiting, sweating, and increased thirst. The reaction can last from 30 minutes to several hours. You may get drowsy or dizzy. Do not drive, use machinery, or do anything that needs mental alertness until you know how this medicine affects you. Do not stand or sit up quickly, especially if you are an older patient. This reduces the risk of dizzy or fainting spells. Alcohol may interfere with the effect of this medicine. Talk to your health care professional about your risk of cancer. You may be more at risk for certain types of cancer if you take this medicine. Do not become pregnant while taking this medicine or for 6 months after stopping it. Women should inform their doctor if they wish to become pregnant or think they might be pregnant. There is a potential for serious side effects to an unborn child. Talk to your health care professional or pharmacist for more information. Do not breast-feed an infant while taking this medicine or for 1 week after stopping it. Males who get this medicine must use a condom during sex with females who can get pregnant. If you get a woman pregnant, the baby could have birth defects. The baby could die before they are born. You will need to continue wearing a condom for 3 months after stopping the medicine. Tell your health care provider right away if your partner becomes pregnant while you are taking this medicine. This may interfere with the ability to father a child. You should talk to your doctor or health care professional if you are concerned about your fertility. What side effects may I notice from receiving this medicine? Side effects  that you should report to your doctor or health care professional as soon as possible:  allergic reactions like skin rash, itching or hives, swelling of the face, lips, or tongue  blurred vision  breathing problems  changes in vision  low blood counts - This drug may decrease the number of white blood cells, red blood cells and platelets. You may be at increased risk for infections and bleeding.  nausea and vomiting  pain, redness or irritation at site where injected  pain, tingling, numbness in the hands or feet  redness, blistering, peeling, or loosening of the skin, including inside the mouth  signs of decreased platelets or bleeding - bruising, pinpoint red spots on the skin, black, tarry stools, nosebleeds  signs of decreased red blood cells - unusually weak or tired, fainting spells, lightheadedness    signs of infection - fever or chills, cough, sore throat, pain or difficulty passing urine  swelling of the ankle, feet, hands Side effects that usually do not require medical attention (report to your doctor or health care professional if they continue or are bothersome):  constipation  diarrhea  fingernail or toenail changes  hair loss  loss of appetite  mouth sores  muscle pain This list may not describe all possible side effects. Call your doctor for medical advice about side effects. You may report side effects to FDA at 1-800-FDA-1088. Where should I keep my medicine? This drug is given in a hospital or clinic and will not be stored at home. NOTE: This sheet is a summary. It may not cover all possible information. If you have questions about this medicine, talk to your doctor, pharmacist, or health care provider.  2021 Elsevier/Gold Standard (2019-01-27 19:50:31)  Carboplatin injection What is this medicine? CARBOPLATIN (KAR boe pla tin) is a chemotherapy drug. It targets fast dividing cells, like cancer cells, and causes these cells to die. This medicine is  used to treat ovarian cancer and many other cancers. This medicine may be used for other purposes; ask your health care provider or pharmacist if you have questions. COMMON BRAND NAME(S): Paraplatin What should I tell my health care provider before I take this medicine? They need to know if you have any of these conditions:  blood disorders  hearing problems  kidney disease  recent or ongoing radiation therapy  an unusual or allergic reaction to carboplatin, cisplatin, other chemotherapy, other medicines, foods, dyes, or preservatives  pregnant or trying to get pregnant  breast-feeding How should I use this medicine? This drug is usually given as an infusion into a vein. It is administered in a hospital or clinic by a specially trained health care professional. Talk to your pediatrician regarding the use of this medicine in children. Special care may be needed. Overdosage: If you think you have taken too much of this medicine contact a poison control center or emergency room at once. NOTE: This medicine is only for you. Do not share this medicine with others. What if I miss a dose? It is important not to miss a dose. Call your doctor or health care professional if you are unable to keep an appointment. What may interact with this medicine?  medicines for seizures  medicines to increase blood counts like filgrastim, pegfilgrastim, sargramostim  some antibiotics like amikacin, gentamicin, neomycin, streptomycin, tobramycin  vaccines Talk to your doctor or health care professional before taking any of these medicines:  acetaminophen  aspirin  ibuprofen  ketoprofen  naproxen This list may not describe all possible interactions. Give your health care provider a list of all the medicines, herbs, non-prescription drugs, or dietary supplements you use. Also tell them if you smoke, drink alcohol, or use illegal drugs. Some items may interact with your medicine. What should I  watch for while using this medicine? Your condition will be monitored carefully while you are receiving this medicine. You will need important blood work done while you are taking this medicine. This drug may make you feel generally unwell. This is not uncommon, as chemotherapy can affect healthy cells as well as cancer cells. Report any side effects. Continue your course of treatment even though you feel ill unless your doctor tells you to stop. In some cases, you may be given additional medicines to help with side effects. Follow all directions for their use. Call your doctor or   health care professional for advice if you get a fever, chills or sore throat, or other symptoms of a cold or flu. Do not treat yourself. This drug decreases your body's ability to fight infections. Try to avoid being around people who are sick. This medicine may increase your risk to bruise or bleed. Call your doctor or health care professional if you notice any unusual bleeding. Be careful brushing and flossing your teeth or using a toothpick because you may get an infection or bleed more easily. If you have any dental work done, tell your dentist you are receiving this medicine. Avoid taking products that contain aspirin, acetaminophen, ibuprofen, naproxen, or ketoprofen unless instructed by your doctor. These medicines may hide a fever. Do not become pregnant while taking this medicine. Women should inform their doctor if they wish to become pregnant or think they might be pregnant. There is a potential for serious side effects to an unborn child. Talk to your health care professional or pharmacist for more information. Do not breast-feed an infant while taking this medicine. What side effects may I notice from receiving this medicine? Side effects that you should report to your doctor or health care professional as soon as possible:  allergic reactions like skin rash, itching or hives, swelling of the face, lips, or  tongue  signs of infection - fever or chills, cough, sore throat, pain or difficulty passing urine  signs of decreased platelets or bleeding - bruising, pinpoint red spots on the skin, black, tarry stools, nosebleeds  signs of decreased red blood cells - unusually weak or tired, fainting spells, lightheadedness  breathing problems  changes in hearing  changes in vision  chest pain  high blood pressure  low blood counts - This drug may decrease the number of white blood cells, red blood cells and platelets. You may be at increased risk for infections and bleeding.  nausea and vomiting  pain, swelling, redness or irritation at the injection site  pain, tingling, numbness in the hands or feet  problems with balance, talking, walking  trouble passing urine or change in the amount of urine Side effects that usually do not require medical attention (report to your doctor or health care professional if they continue or are bothersome):  hair loss  loss of appetite  metallic taste in the mouth or changes in taste This list may not describe all possible side effects. Call your doctor for medical advice about side effects. You may report side effects to FDA at 1-800-FDA-1088. Where should I keep my medicine? This drug is given in a hospital or clinic and will not be stored at home. NOTE: This sheet is a summary. It may not cover all possible information. If you have questions about this medicine, talk to your doctor, pharmacist, or health care provider.  2021 Elsevier/Gold Standard (2007-06-04 14:38:05)  

## 2020-07-15 ENCOUNTER — Inpatient Hospital Stay: Payer: BC Managed Care – PPO

## 2020-07-15 ENCOUNTER — Other Ambulatory Visit: Payer: Self-pay

## 2020-07-15 ENCOUNTER — Inpatient Hospital Stay (HOSPITAL_BASED_OUTPATIENT_CLINIC_OR_DEPARTMENT_OTHER): Payer: BC Managed Care – PPO | Admitting: Licensed Clinical Social Worker

## 2020-07-15 ENCOUNTER — Encounter: Payer: Self-pay | Admitting: Licensed Clinical Social Worker

## 2020-07-15 ENCOUNTER — Ambulatory Visit: Payer: Self-pay | Admitting: General Surgery

## 2020-07-15 DIAGNOSIS — C50412 Malignant neoplasm of upper-outer quadrant of left female breast: Secondary | ICD-10-CM

## 2020-07-15 DIAGNOSIS — Z170 Estrogen receptor positive status [ER+]: Secondary | ICD-10-CM

## 2020-07-15 NOTE — Progress Notes (Signed)
REFERRING PROVIDER: Sindy Guadeloupe, MD [place],  [place] [address]  PRIMARY PROVIDER:  Kirk Ruths, MD  PRIMARY REASON FOR VISIT:  1. Malignant neoplasm of upper-outer quadrant of left breast in female, estrogen receptor positive ([place])      HISTORY OF PRESENT ILLNESS:   [name], a 65 y.o. female, was seen for a [facility] cancer genetics consultation at the request of Dr. Janese Banks due to a personal history of breast cancer.  [name] presents to clinic today to discuss the possibility of a hereditary predisposition to cancer, genetic testing, and to further clarify her future cancer risks, as well as potential cancer risks for family members.   In 2022, at the age of 43, [name] was diagnosed with invasive mammary carcinoma of the left breast, ER+, PR-, Her2+. The treatment plan includes neoadjuvant chemotherapy, surgery and adjuvant radiation.    CANCER HISTORY:  Oncology History  Malignant neoplasm of upper-outer quadrant of left breast in female, estrogen receptor positive ([place])  07/12/2020 Initial Diagnosis   Malignant neoplasm of upper-outer quadrant of left breast in female, estrogen receptor positive ([place])   07/12/2020 Cancer Staging   Staging form: Breast, AJCC 8th Edition - Clinical stage from 07/12/2020: Stage IIA (cT2, cN0, cM0, G3, ER+, PR-, HER2+) - Signed by Sindy Guadeloupe, MD on 07/12/2020 Stage prefix: Initial diagnosis Histologic grading system: 3 grade system   07/13/2020 -  Chemotherapy    Patient is on Treatment Plan: BREAST  DOCETAXEL + CARBOPLATIN + TRASTUZUMAB + PERTUZUMAB  (TCHP) Q21D          RISK FACTORS:  Menarche was at age 40.  First live birth at age 58s.  Ovaries intact: has 1.  Hysterectomy: no.  Menopausal status: postmenopausal.  HRT use: 0 years. Colonoscopy: yes; normal. Mammogram within the last year: yes. Number of breast biopsies: 2.     Past Surgical History:  Procedure Laterality Date  . BREAST BIOPSY Left  07/05/2020   [place] Bx, Q clip, path pending  . BREAST BIOPSY Left 07/05/2020   [place] Bx, Vision, path pending   . CHOLECYSTECTOMY    . right ovary removal      Social History   Socioeconomic History  . Marital status: Married    Spouse name: Not on file  . Number of children: Not on file  . Years of education: Not on file  . Highest education level: Not on file  Occupational History  . Not on file  Tobacco Use  . Smoking status: Former Smoker    Start date: 2012  . Smokeless tobacco: Never Used  Substance and Sexual Activity  . Alcohol use: Yes    Comment: occassional   . Drug use: Not on file  . Sexual activity: Not on file  Other Topics Concern  . Not on file  Social History Narrative  . Not on file   Social Determinants of Health   Financial Resource Strain: Not on file  Food Insecurity: Not on file  Transportation Needs: Not on file  Physical Activity: Not on file  Stress: Not on file  Social Connections: Not on file     FAMILY HISTORY:  We obtained a detailed, 4-generation family history.  Significant diagnoses are listed below: Family History  Adopted: Yes  Problem Relation Age of Onset  . Breast cancer Neg Hx    Wendy Caldwell was adopted and has no information about her biological family. She has 1 daughter, 59, and 1  daughter, 47 but she does not have contact with this daughter.   Wendy Caldwell is unaware of previous family history of genetic testing for hereditary cancer risks. Patient's maternal ancestors are of is of unknown descent, and paternal ancestors are of unknown descent. There is no reported Ashkenazi Jewish ancestry. There is no known consanguinity.  GENETIC COUNSELING ASSESSMENT: Wendy Caldwell is a 65 y.o. female with a personal history of breast cancer which is somewhat suggestive of a hereditary cancer syndrome and predisposition to cancer. We, therefore, discussed and recommended the following at today's visit.   DISCUSSION: We discussed that  approximately 5-10% of breast cancer is hereditary  Most cases of hereditary breast cancer are associated with BRCA1/BRCA2 genes, although there are other genes associated with hereditary breast cancer as well as other cancers .  We discussed that testing is beneficial for several reasons including surgical decision-making for breast cancer, knowing about other cancer risks, identifying potential screening and risk-reduction options that may be appropriate, and to understand if other family members could be at risk for cancer and allow them to undergo genetic testing.   We reviewed the characteristics, features and inheritance patterns of hereditary cancer syndromes. We also discussed genetic testing, including the appropriate family members to test, the process of testing, insurance coverage and turn-around-time for results. We discussed the implications of a negative, positive and/or variant of uncertain significant result. We recommended [name] pursue genetic testing for the [facility] CancerNext-Expanded+RNA gene panel.   The CancerNext-Expanded + RNAinsight gene panel offered by [employer] and includes sequencing and rearrangement analysis for the following 77 genes: IP, ALK, APC*, ATM*, AXIN2, BAP1, BARD1, BLM, BMPR1A, BRCA1*, BRCA2*, BRIP1*, CDC73, CDH1*,CDK4, CDKN1B, CDKN2A, CHEK2*, CTNNA1, DICER1, FANCC, FH, FLCN, GALNT12, KIF1B, LZTR1, MAX, MEN1, MET, MLH1*, MSH2*, MSH3, MSH6*, MUTYH*, NBN, NF1*, NF2, NTHL1, PALB2*, PHOX2B, PMS2*, POT1, PRKAR1A, PTCH1, PTEN*, RAD51C*, RAD51D*,RB1, RECQL, RET, SDHA, SDHAF2, SDHB, SDHC, SDHD, SMAD4, SMARCA4, SMARCB1, SMARCE1, STK11, SUFU, TMEM127, TP53*,TSC1, TSC2, VHL and XRCC2 (sequencing and deletion/duplication); EGFR, EGLN1, HOXB13, KIT, MITF, PDGFRA, POLD1 and POLE (sequencing only); EPCAM and GREM1 (deletion/duplication only).  Based on Wendy Caldwell personal history of cancer, she does not quite meet NCCN medical criteria for genetic testing but does meet ASBrS  guidelines. She may still have an out of pocket cost. We discussed that if her out of pocket cost for testing is over $100, the laboratory will call and confirm whether she wants to proceed with testing.  If the out of pocket cost of testing is less than $100 she will be billed by the genetic testing laboratory.   PLAN: After considering the risks, benefits, and limitations, Ms. Amend provided informed consent to pursue genetic testing and the blood sample was sent to [facility] for analysis of the CancerNext-Expanded+RNA panel. Results should be available within approximately 2-3 weeks' time, at which point they will be disclosed by telephone to Ms. Benning, as will any additional recommendations warranted by these results. Ms. Gallicchio will receive a summary of her genetic counseling visit and a copy of her results once available. This information will also be available in Epic.   Ms. Ney questions were answered to her satisfaction today. Our contact information was provided should additional questions or concerns arise. Thank you for the referral and allowing [place] to share in the care of your patient.   Faith Rogue, MS, [facility] Genetic Counselor [email or web address] Phone: [phone]  The patient was seen for a total of 20 minutes in face-to-face genetic counseling.  Dr. Grayland Ormond was available for discussion regarding this case.   _______________________________________________________________________ For Office Staff:  Number of people involved in session: 1 Was an Intern/ student involved with case: no

## 2020-07-16 ENCOUNTER — Other Ambulatory Visit: Payer: Self-pay | Admitting: *Deleted

## 2020-07-16 DIAGNOSIS — Z170 Estrogen receptor positive status [ER+]: Secondary | ICD-10-CM

## 2020-07-16 DIAGNOSIS — C50412 Malignant neoplasm of upper-outer quadrant of left female breast: Secondary | ICD-10-CM

## 2020-07-20 ENCOUNTER — Encounter
Admission: RE | Admit: 2020-07-20 | Discharge: 2020-07-20 | Disposition: A | Discharge status: Home / Self Care | Payer: BC Managed Care – PPO | Source: Ambulatory Visit | Attending: Surgery | Admitting: Surgery

## 2020-07-20 ENCOUNTER — Ambulatory Visit
Admission: RE | Admit: 2020-07-20 | Discharge: 2020-07-20 | Disposition: A | Discharge status: Home / Self Care | Payer: BC Managed Care – PPO | Source: Ambulatory Visit | Attending: Oncology | Admitting: Oncology

## 2020-07-20 ENCOUNTER — Other Ambulatory Visit: Payer: Self-pay

## 2020-07-20 DIAGNOSIS — C50412 Malignant neoplasm of upper-outer quadrant of left female breast: Secondary | ICD-10-CM | POA: Diagnosis not present

## 2020-07-20 DIAGNOSIS — Z01818 Encounter for other preprocedural examination: Secondary | ICD-10-CM | POA: Diagnosis not present

## 2020-07-20 DIAGNOSIS — Z79899 Other long term (current) drug therapy: Secondary | ICD-10-CM

## 2020-07-20 DIAGNOSIS — Z170 Estrogen receptor positive status [ER+]: Secondary | ICD-10-CM | POA: Diagnosis not present

## 2020-07-20 DIAGNOSIS — Z0189 Encounter for other specified special examinations: Secondary | ICD-10-CM | POA: Diagnosis not present

## 2020-07-20 HISTORY — DX: Family history of other specified conditions: Z84.89

## 2020-07-20 HISTORY — DX: Gastro-esophageal reflux disease without esophagitis: K21.9

## 2020-07-20 LAB — ECHOCARDIOGRAM COMPLETE
AR max vel: 1.72 cm2
AV Area VTI: 1.93 cm2
AV Area mean vel: 1.71 cm2
AV Mean grad: 4 mmHg
AV Peak grad: 8.4 mmHg
Ao pk vel: 1.45 m/s
Area-P 1/2: 3.54 cm2
Height: 63 in
MV VTI: 2.64 cm2
S' Lateral: 2.3 cm
Weight: 2896 oz

## 2020-07-20 MED ORDER — CEFAZOLIN SODIUM-DEXTROSE 2-4 GM/100ML-% IV SOLN
2.0000 g | INTRAVENOUS | Status: AC
  Administered 2020-07-21: 2 g via INTRAVENOUS

## 2020-07-20 MED ORDER — ORAL CARE MOUTH RINSE: 15.0000 mL | Freq: Once | OROMUCOSAL | Status: AC

## 2020-07-20 MED ORDER — CHLORHEXIDINE GLUCONATE 0.12 % MT SOLN: 15.0000 mL | Freq: Once | OROMUCOSAL | Status: AC

## 2020-07-20 MED ORDER — LACTATED RINGERS IV SOLN: INTRAVENOUS | Status: DC

## 2020-07-20 NOTE — Progress Notes (Signed)
*  PRELIMINARY RESULTS* Echocardiogram 2D Echocardiogram has been performed.  [place] 07/20/2020, 10:47 AM

## 2020-07-20 NOTE — Patient Instructions (Signed)
Your procedure is scheduled on: 07/21/20 Report to [facility]. To find out your arrival time please call [phone] between 1PM - 3PM on 07/20/20.  Remember: Instructions that are not followed completely may result in serious medical risk, up to and including death, or upon the discretion of your surgeon and anesthesiologist your surgery may need to be rescheduled.     _X__ 1. Do not eat food or liquids after midnight the night before your procedure.                 No gum chewing or hard candies.   __X__2.  On the morning of surgery brush your teeth with toothpaste and water, you                 may rinse your mouth with mouthwash if you wish.  Do not swallow any              toothpaste of mouthwash.     _X__ 3.  No Alcohol for 24 hours before or after surgery.   _X__ 4.  Do Not Smoke or use e-cigarettes For 24 Hours Prior to Your Surgery.                 Do not use any chewable tobacco products for at least 6 hours prior to                 surgery.  ____  5.  Bring all medications with you on the day of surgery if instructed.   __X__  6.  Notify your doctor if there is any change in your medical condition      (cold, fever, infections).     Do not wear jewelry, make-up, hairpins, clips or nail polish. Do not wear lotions, powders, or perfumes or deodorant  Do not shave 48 hours prior to surgery. Men may shave face and neck. Do not bring valuables to the hospital.    [facility] is not responsible for any belongings or valuables.  Contacts, dentures/partials or body piercings may not be worn into surgery. Bring a case for your contacts, glasses or hearing aids, a denture cup will be supplied. Leave your suitcase in the car. After surgery it may be brought to your room. For patients admitted to the hospital, discharge time is determined by your treatment team.   Patients discharged the day of surgery will not be allowed to  drive home.   Please read over the following fact sheets that you were given:   MRSA Information, chg soap  __X__ Take these medicines the morning of surgery with A SIP OF WATER:    1. omeprazole (PRILOSEC) 40 MG capsule  2. loratadine (CLARITIN) 10 MG tablet  3.   4.  5.  6.  ____ Fleet Enema (as directed)   __X__ Use CHG Soap/SAGE wipes as directed  ____ Use inhalers on the day of surgery  ____ Stop metformin/Janumet/Farxiga 2 days prior to surgery    ____ Take 1/2 of usual insulin dose the night before surgery. No insulin the morning          of surgery.   ____ Stop Blood Thinners Coumadin/Plavix/Xarelto/Pleta/Pradaxa/Eliquis/Effient/Aspirin  on   Or contact your Surgeon, Cardiologist or Medical Doctor regarding  ability to stop your blood thinners  __X__ Stop Anti-inflammatories 7 days before surgery such as Advil, Ibuprofen, Motrin,  BC or Goodies Powder, Naprosyn, Naproxen, Aleve, Aspirin  __X__ Stop all herbal supplements, fish oil or vitamin E until after surgery.    ____ Bring C-Pap to the hospital.

## 2020-07-21 ENCOUNTER — Ambulatory Visit
Admission: RE | Admit: 2020-07-21 | Discharge: 2020-07-21 | Disposition: A | Discharge status: Home / Self Care | Payer: BC Managed Care – PPO | Attending: General Surgery | Admitting: General Surgery

## 2020-07-21 ENCOUNTER — Ambulatory Visit: Payer: BC Managed Care – PPO | Admitting: Anesthesiology

## 2020-07-21 ENCOUNTER — Ambulatory Visit: Payer: BC Managed Care – PPO

## 2020-07-21 ENCOUNTER — Encounter
Admission: RE | Disposition: A | Discharge status: Home / Self Care | Payer: Self-pay | Source: Home / Self Care | Attending: General Surgery

## 2020-07-21 ENCOUNTER — Encounter: Payer: Self-pay | Admitting: General Surgery

## 2020-07-21 ENCOUNTER — Other Ambulatory Visit: Payer: Self-pay

## 2020-07-21 DIAGNOSIS — Z79899 Other long term (current) drug therapy: Secondary | ICD-10-CM | POA: Insufficient documentation

## 2020-07-21 DIAGNOSIS — Z9049 Acquired absence of other specified parts of digestive tract: Secondary | ICD-10-CM | POA: Diagnosis not present

## 2020-07-21 DIAGNOSIS — Z171 Estrogen receptor negative status [ER-]: Secondary | ICD-10-CM | POA: Diagnosis not present

## 2020-07-21 DIAGNOSIS — Z452 Encounter for adjustment and management of vascular access device: Secondary | ICD-10-CM | POA: Diagnosis not present

## 2020-07-21 DIAGNOSIS — Z95828 Presence of other vascular implants and grafts: Secondary | ICD-10-CM

## 2020-07-21 DIAGNOSIS — J40 Bronchitis, not specified as acute or chronic: Secondary | ICD-10-CM | POA: Diagnosis not present

## 2020-07-21 DIAGNOSIS — C50412 Malignant neoplasm of upper-outer quadrant of left female breast: Secondary | ICD-10-CM | POA: Insufficient documentation

## 2020-07-21 DIAGNOSIS — R918 Other nonspecific abnormal finding of lung field: Secondary | ICD-10-CM | POA: Diagnosis not present

## 2020-07-21 DIAGNOSIS — Z87891 Personal history of nicotine dependence: Secondary | ICD-10-CM | POA: Diagnosis not present

## 2020-07-21 HISTORY — PX: PORTACATH PLACEMENT: SHX2246

## 2020-07-21 SURGERY — INSERTION, TUNNELED CENTRAL VENOUS DEVICE, WITH PORT
Anesthesia: General | Site: Chest | Laterality: Right

## 2020-07-21 MED ORDER — ONDANSETRON HCL 4 MG/2ML IJ SOLN: 4.0000 mg | Freq: Once | INTRAMUSCULAR | Status: DC | PRN

## 2020-07-21 MED ORDER — FENTANYL CITRATE (PF) 100 MCG/2ML IJ SOLN: 25.0000 ug | INTRAMUSCULAR | Status: DC | PRN

## 2020-07-21 MED ORDER — HEPARIN SODIUM (PORCINE) 5000 UNIT/ML IJ SOLN
INTRAMUSCULAR | Status: AC
  Filled 2020-07-21: qty 1

## 2020-07-21 MED ORDER — LIDOCAINE HCL (CARDIAC) PF 100 MG/5ML IV SOSY
PREFILLED_SYRINGE | INTRAVENOUS | Status: DC | PRN
  Administered 2020-07-21: 30 mg via INTRAVENOUS

## 2020-07-21 MED ORDER — HYDROCODONE-ACETAMINOPHEN 5-325 MG PO TABS: 1.0000 | ORAL_TABLET | ORAL | 0 refills | Status: AC | PRN

## 2020-07-21 MED ORDER — BUPIVACAINE-EPINEPHRINE (PF) 0.25% -1:200000 IJ SOLN
INTRAMUSCULAR | Status: DC | PRN
  Administered 2020-07-21: 10 mL

## 2020-07-21 MED ORDER — FENTANYL CITRATE (PF) 100 MCG/2ML IJ SOLN
INTRAMUSCULAR | Status: DC | PRN
  Administered 2020-07-21 (×4): 25 ug via INTRAVENOUS

## 2020-07-21 MED ORDER — MIDAZOLAM HCL 2 MG/2ML IJ SOLN
INTRAMUSCULAR | Status: DC | PRN
  Administered 2020-07-21: 2 mg via INTRAVENOUS

## 2020-07-21 MED ORDER — PROPOFOL 10 MG/ML IV BOLUS
INTRAVENOUS | Status: DC | PRN
  Administered 2020-07-21: 20 mg via INTRAVENOUS
  Administered 2020-07-21: 30 mg via INTRAVENOUS

## 2020-07-21 MED ORDER — CHLORHEXIDINE GLUCONATE 0.12 % MT SOLN
OROMUCOSAL | Status: AC
  Administered 2020-07-21: 15 mL via OROMUCOSAL
  Filled 2020-07-21: qty 15

## 2020-07-21 MED ORDER — PROPOFOL 1000 MG/100ML IV EMUL
INTRAVENOUS | Status: AC
  Filled 2020-07-21: qty 100

## 2020-07-21 MED ORDER — SODIUM CHLORIDE (PF) 0.9 % IJ SOLN
INTRAMUSCULAR | Status: AC
  Filled 2020-07-21: qty 50

## 2020-07-21 MED ORDER — PROPOFOL 500 MG/50ML IV EMUL
INTRAVENOUS | Status: DC | PRN
  Administered 2020-07-21: 100 ug/kg/min via INTRAVENOUS

## 2020-07-21 MED ORDER — SODIUM CHLORIDE 0.9 % IV SOLN
INTRAVENOUS | Status: DC | PRN
  Administered 2020-07-21: 10 mL via INTRAMUSCULAR

## 2020-07-21 MED ORDER — CEFAZOLIN SODIUM-DEXTROSE 2-4 GM/100ML-% IV SOLN
INTRAVENOUS | Status: AC
  Filled 2020-07-21: qty 100

## 2020-07-21 MED ORDER — BUPIVACAINE-EPINEPHRINE (PF) 0.25% -1:200000 IJ SOLN
INTRAMUSCULAR | Status: AC
  Filled 2020-07-21: qty 30

## 2020-07-21 MED ORDER — FENTANYL CITRATE (PF) 100 MCG/2ML IJ SOLN
INTRAMUSCULAR | Status: AC
  Filled 2020-07-21: qty 2

## 2020-07-21 MED ORDER — MIDAZOLAM HCL 2 MG/2ML IJ SOLN
INTRAMUSCULAR | Status: AC
  Filled 2020-07-21: qty 2

## 2020-07-21 SURGICAL SUPPLY — 36 items
ADH SKN CLS APL DERMABOND .7 (GAUZE/BANDAGES/DRESSINGS) ×1
APL PRP STRL LF DISP 70% ISPRP (MISCELLANEOUS) ×1
BAG DECANTER FOR FLEXI CONT (MISCELLANEOUS) ×2 IMPLANT
BLADE SURG 11 STRL SS (BLADE) ×2 IMPLANT
BLADE SURG SZ11 CARB STEEL (BLADE) ×2 IMPLANT
BOOT SUTURE AID YELLOW STND (SUTURE) ×2 IMPLANT
CANISTER SUCT 1200ML W/VALVE (MISCELLANEOUS) IMPLANT
CHLORAPREP W/TINT 26 (MISCELLANEOUS) ×2 IMPLANT
COVER LIGHT HANDLE STERIS (MISCELLANEOUS) ×4 IMPLANT
COVER WAND RF STERILE (DRAPES) ×2 IMPLANT
DERMABOND ADVANCED (GAUZE/BANDAGES/DRESSINGS) ×1
DERMABOND ADVANCED .7 DNX12 (GAUZE/BANDAGES/DRESSINGS) ×1 IMPLANT
DRAPE C-ARM XRAY 36X54 (DRAPES) ×2 IMPLANT
ELECT REM PT RETURN 9FT ADLT (ELECTROSURGICAL) ×2
ELECTRODE REM PT RTRN 9FT ADLT (ELECTROSURGICAL) ×1 IMPLANT
GLOVE SURG ENC MOIS LTX SZ6.5 (GLOVE) ×2 IMPLANT
GLOVE SURG UNDER POLY LF SZ6.5 (GLOVE) ×2 IMPLANT
GOWN STRL REUS W/ TWL LRG LVL3 (GOWN DISPOSABLE) ×3 IMPLANT
GOWN STRL REUS W/TWL LRG LVL3 (GOWN DISPOSABLE) ×6
IV NS 500ML (IV SOLUTION) ×2
IV NS 500ML BAXH (IV SOLUTION) ×1 IMPLANT
KIT PORT POWER 8FR ISP CVUE (Port) ×2 IMPLANT
KIT TURNOVER KIT A (KITS) ×2 IMPLANT
LABEL OR SOLS (LABEL) ×2 IMPLANT
MANIFOLD NEPTUNE II (INSTRUMENTS) ×2 IMPLANT
NEEDLE FILTER BLUNT 18X 1/2SAF (NEEDLE) ×1
NEEDLE FILTER BLUNT 18X1 1/2 (NEEDLE) ×1 IMPLANT
PACK PORT-A-CATH (MISCELLANEOUS) ×2 IMPLANT
SUT MNCRL AB 4-0 PS2 18 (SUTURE) ×2 IMPLANT
SUT PROLENE 2 0 FS (SUTURE) ×2 IMPLANT
SUT VIC AB 2-0 SH 27 (SUTURE) ×2
SUT VIC AB 2-0 SH 27XBRD (SUTURE) ×1 IMPLANT
SUT VIC AB 3-0 SH 27 (SUTURE) ×2
SUT VIC AB 3-0 SH 27X BRD (SUTURE) ×1 IMPLANT
SYR 10ML LL (SYRINGE) ×2 IMPLANT
SYR 3ML LL SCALE MARK (SYRINGE) ×2 IMPLANT

## 2020-07-21 NOTE — Anesthesia Preprocedure Evaluation (Signed)
Anesthesia Evaluation   Patient awake    Reviewed: Allergy & Precautions, NPO status , Patient's Chart, lab work & pertinent test results  Airway Mallampati: III  TM Distance: <3 FB     Dental  (+) Caps, Teeth Intact   Pulmonary former smoker,    Pulmonary exam normal        Cardiovascular negative cardio ROS Normal cardiovascular exam     Neuro/Psych negative neurological ROS  negative psych ROS   GI/Hepatic Neg liver ROS, GERD  Medicated and Controlled,  Endo/Other  negative endocrine ROS  Renal/GU negative Renal ROS  negative genitourinary   Musculoskeletal negative musculoskeletal ROS (+)   Abdominal Normal abdominal exam  (+)   Peds negative pediatric ROS (+)  Hematology negative hematology ROS (+)   Anesthesia Other Findings Past Medical History: No date: Family history of adverse reaction to anesthesia     Comment:  adopted No date: GERD (gastroesophageal reflux disease)  Reproductive/Obstetrics                             Anesthesia Physical Anesthesia Plan  ASA: II  Anesthesia Plan: General   Post-op Pain Management:    Induction: Intravenous  PONV Risk Score and Plan: Propofol infusion and TIVA  Airway Management Planned: Nasal Cannula  Additional Equipment:   Intra-op Plan:   Post-operative Plan:   Informed Consent: I have reviewed the patients History and Physical, chart, labs and discussed the procedure including the risks, benefits and alternatives for the proposed anesthesia with the patient or authorized representative who has indicated his/her understanding and acceptance.     Dental advisory given  Plan Discussed with: CRNA and Surgeon  Anesthesia Plan Comments:         Anesthesia Quick Evaluation

## 2020-07-21 NOTE — Discharge Instructions (Signed)
AMBULATORY SURGERY  DISCHARGE INSTRUCTIONS   1) The drugs that you were given will stay in your system until tomorrow so for the next 24 hours you should not:  A) Drive an automobile B) Make any legal decisions C) Drink any alcoholic beverage   2) You may resume regular meals tomorrow.  Today it is better to start with liquids and gradually work up to solid foods.  You may eat anything you prefer, but it is better to start with liquids, then soup and crackers, and gradually work up to solid foods.   3) Please notify your doctor immediately if you have any unusual bleeding, trouble breathing, redness and pain at the surgery site, drainage, fever, or pain not relieved by medication.    4) Additional Instructions:  AMBULATORY SURGERY  DISCHARGE INSTRUCTIONS   5) The drugs that you were given will stay in your system until tomorrow so for the next 24 hours you should not:  D) Drive an automobile E) Make any legal decisions F) Drink any alcoholic beverage   6) You may resume regular meals tomorrow.  Today it is better to start with liquids and gradually work up to solid foods.  You may eat anything you prefer, but it is better to start with liquids, then soup and crackers, and gradually work up to solid foods.   7) Please notify your doctor immediately if you have any unusual bleeding, trouble breathing, redness and pain at the surgery site, drainage, fever, or pain not relieved by medication.    8) Additional Instructions:        Please contact your physician with any problems or Same Day Surgery at [phone], Monday through Friday 6 am to 4 pm, or [place] at [facility] number at [phone].Please contact your physician with any problems or Same Day Surgery at [phone], Monday through Friday 6 am to 4 pm, or [place] at [facility] number at [phone]. Diet: Resume home heart healthy regular diet.   Activity: Increase activity as tolerated.  Light activity and walking are encouraged. Do not drive or drink alcohol if taking narcotic pain medications.  Wound care: May shower with soapy water and pat dry (do not rub incisions), but no baths or submerging incision underwater until follow-up. (no swimming)   Medications: Resume all home medications. For mild to moderate pain: acetaminophen (Tylenol) or ibuprofen (if no kidney disease). Combining Tylenol with alcohol can substantially increase your risk of causing liver disease. Narcotic pain medications, if prescribed, can be used for severe pain, though may cause nausea, constipation, and drowsiness. Do not combine Tylenol and Norco within a 6 hour period as Norco contains Tylenol. If you do not need the narcotic pain medication, you do not need to fill the prescription.  Call office [phone]) at any time if any questions, worsening pain, fevers/chills, bleeding, drainage from incision site, or other concerns.

## 2020-07-21 NOTE — OR PostOp (Incomplete)
PACU TO INPATIENT HANDOFF REPORT  Name/Age/Gender Wendy Caldwell 65 y.o. female  Code Status   Home/SNF/Other {Discharge Destination:18313::"Home"}  Chief Complaint Malignant neoplasm of upper-outer quadrant of left female breast, unspecified estrogen receptor status CMS HCC C50.412  Level of Care/Admitting Diagnosis ED Disposition    None      Medical History Past Medical History:  Diagnosis Date  . Family history of adverse reaction to anesthesia    adopted  . GERD (gastroesophageal reflux disease)     Allergies No Known Allergies  IV Location/Drains/Wounds Patient Lines/Drains/Airways Status    Active Line/Drains/Airways    Name Placement date Placement time Site Days   Implanted Port 07/21/20 Right Chest 07/21/20  1701  Chest  less than 1   Peripheral IV 07/21/20 Distal;Left;Posterior Forearm 07/21/20  1435  Forearm  less than 1   Incision (Closed) 07/21/20 Chest Right 07/21/20  1716  - less than 1          Labs/Imaging No results found for this or any previous visit (from the past 82 hour(s)). DG C-Arm 1-60 Min-No Report  Result Date: 07/21/2020 Fluoroscopy was utilized by the requesting physician.  No radiographic interpretation.   ECHOCARDIOGRAM COMPLETE  Result Date: 07/20/2020    ECHOCARDIOGRAM REPORT   Patient Name:   Wendy Caldwell [facility] Date of Exam: 07/20/2020 Medical Rec #:  [id number]          Height:       63.0 in Accession #:    [id number]         Weight:       181.0 lb Date of Birth:  1955-04-03         BSA:          1.853 m Patient Age:    89 years           BP:           145/70 mmHg Patient Gender: F                  HR:           82 bpm. Exam Location:  [facility] Procedure: 2D Echo, Color Doppler, Cardiac Doppler and Strain Analysis Indications:     Z09 Chemo  History:         Patient has no prior history of Echocardiogram examinations.                  Malignant neoplasm of upper-outer quadrant of left breast in                  female, estrogen  receptor positive.  Sonographer:     Charmayne Sheer RDCS (AE) Referring Phys:  [id number] Weston Anna RAO Diagnosing Phys: Kate Sable MD  Sonographer Comments: Suboptimal parasternal window. Global longitudinal strain was attempted. IMPRESSIONS  1. Left ventricular ejection fraction, by estimation, is 60 to 65%. The left ventricle has normal function. The left ventricle has no regional wall motion abnormalities. Left ventricular diastolic parameters were normal. The average left ventricular global longitudinal strain is -25.3 %. The global longitudinal strain is normal.  2. Right ventricular systolic function is normal. The right ventricular size is normal.  3. The mitral valve is normal in structure. Mild mitral valve regurgitation.  4. The aortic valve was not well visualized. Aortic valve regurgitation is not visualized.  5. The inferior vena cava is normal in size with greater than 50% respiratory variability, suggesting right atrial pressure of 3 mmHg. FINDINGS  Left Ventricle: Left ventricular ejection fraction, by estimation, is 60 to 65%. The left ventricle has normal function. The left ventricle has no regional wall motion abnormalities. The average left ventricular global longitudinal strain is -25.3 %. The global longitudinal strain is normal. The left ventricular internal cavity size was normal in size. There is no left ventricular hypertrophy. Left ventricular diastolic parameters were normal. Right Ventricle: The right ventricular size is normal. No increase in right ventricular wall thickness. Right ventricular systolic function is normal. Left Atrium: Left atrial size was normal in size. Right Atrium: Right atrial size was normal in size. Pericardium: There is no evidence of pericardial effusion. Mitral Valve: The mitral valve is normal in structure. Mild mitral valve regurgitation. MV peak gradient, 2.9 mmHg. The mean mitral valve gradient is 1.0 mmHg. Tricuspid Valve: The tricuspid valve is normal in  structure. Tricuspid valve regurgitation is not demonstrated. Aortic Valve: The aortic valve was not well visualized. Aortic valve regurgitation is not visualized. Aortic valve mean gradient measures 4.0 mmHg. Aortic valve peak gradient measures 8.4 mmHg. Aortic valve area, by VTI measures 1.93 cm. Pulmonic Valve: The pulmonic valve was not well visualized. Pulmonic valve regurgitation is not visualized. Aorta: The aortic root is normal in size and structure. Venous: The inferior vena cava is normal in size with greater than 50% respiratory variability, suggesting right atrial pressure of 3 mmHg. IAS/Shunts: No atrial level shunt detected by color flow Doppler.  LEFT VENTRICLE PLAX 2D LVIDd:         3.30 cm  Diastology LVIDs:         2.30 cm  LV e' medial:    8.38 cm/s LV PW:         0.90 cm  LV E/e' medial:  9.2 LV IVS:        0.80 cm  LV e' lateral:   12.20 cm/s LVOT diam:     1.70 cm  LV E/e' lateral: 6.3 LV SV:         55 LV SV Index:   30       2D Longitudinal Strain LVOT Area:     2.27 cm 2D Strain GLS Avg:     -25.3 %  RIGHT VENTRICLE RV Basal diam:  2.30 cm TAPSE (M-mode): 3.2 cm LEFT ATRIUM           Index       RIGHT ATRIUM           Index LA diam:      3.20 cm 1.73 cm/m  RA Area:     11.20 cm LA Vol (A2C): 38.5 ml 20.77 ml/m RA Volume:   23.20 ml  12.52 ml/m LA Vol (A4C): 20.8 ml 11.22 ml/m  AORTIC VALVE                   PULMONIC VALVE AV Area (Vmax):    1.72 cm    PV Vmax:       1.09 m/s AV Area (Vmean):   1.71 cm    PV Vmean:      76.900 cm/s AV Area (VTI):     1.93 cm    PV VTI:        0.233 m AV Vmax:           145.00 cm/s PV Peak grad:  4.8 mmHg AV Vmean:          97.800 cm/s PV Mean grad:  3.0 mmHg AV VTI:  0.286 m AV Peak Grad:      8.4 mmHg AV Mean Grad:      4.0 mmHg LVOT Vmax:         110.00 cm/s LVOT Vmean:        73.500 cm/s LVOT VTI:          0.243 m LVOT/AV VTI ratio: 0.85  AORTA Ao Root diam: 2.50 cm MITRAL VALVE MV Area (PHT): 3.54 cm    SHUNTS MV Area VTI:   2.64 cm     Systemic VTI:  0.24 m MV Peak grad:  2.9 mmHg    Systemic Diam: 1.70 cm MV Mean grad:  1.0 mmHg MV Vmax:       0.85 m/s MV Vmean:      54.1 cm/s MV Decel Time: 214 msec MV E velocity: 77.10 cm/s MV A velocity: 63.40 cm/s MV E/A ratio:  1.22 Kate Sable MD Electronically signed by Kate Sable MD Signature Date/Time: 07/20/2020/2:11:37 PM    Final     Pending Labs   Vitals/Pain Today's Vitals   07/21/20 1417 07/21/20 1725 07/21/20 1730 07/21/20 1745  BP: 125/87 117/69 116/71 122/79  Pulse: 89 85 81 80  Resp: 18 15 13 20   Temp: 98.5 F (36.9 C) 98.2 F (36.8 C)  98.3 F (36.8 C)  TempSrc: Temporal     SpO2: 100% 95% 95% 95%  Weight: 81.6 kg     Height: 5\' 3"  (1.6 m)     PainSc: 0-No pain 0-No pain 0-No pain 0-No pain    Isolation Precautions @ISOLATION @  Administered Medications Periop Administered Meds from 07/21/2020 1358 to 07/21/2020 1750      Date/Time Order Dose Route Action Action by Comments    07/21/2020 1643 bupivacaine-epinephrine (MARCAINE W/ EPI) 0.25% -1:200000 injection 10 mL Infiltration Given Herbert Pun, MD     07/21/2020 1626 ceFAZolin (ANCEF) 2-4 GM/100ML-% IVPB    Override pull for Anesthesia Allean Found, CRNA Filed by anesthesia medication administration from clinical order [id number]    07/21/2020 1620 ceFAZolin (ANCEF) IVPB 2g/100 mL premix 2 g Intravenous Given Allean Found, CRNA     07/21/2020 1439 chlorhexidine (PERIDEX) 0.12 % solution 15 mL 15 mL Mouth/Throat Given Annitta Needs, RN     07/21/2020 1708 fentaNYL (SUBLIMAZE) injection 25 mcg Intravenous Given Fletcher-Harrison, Nira Conn, CRNA     07/21/2020 1645 fentaNYL (SUBLIMAZE) injection 25 mcg Intravenous Given Allean Found, CRNA     07/21/2020 1640 fentaNYL (SUBLIMAZE) injection 25 mcg Intravenous Given Allean Found, CRNA     07/21/2020 1628 fentaNYL (SUBLIMAZE) injection 25 mcg Intravenous Given Allean Found, CRNA     07/21/2020 1714 heparin in normal saline 50  ml injection (flush) 10 mL Injection Given Herbert Pun, MD     07/21/2020 1716 lactated ringers infusion 0  Intravenous Stopped Fletcher-Harrison, Nira Conn, CRNA     07/21/2020 1615 lactated ringers infusion   Intravenous Restarted Allean Found, CRNA     07/21/2020 1614 lactated ringers infusion   Intravenous Zoila Shutter, CRNA Switch to gravity    07/21/2020 1439 lactated ringers infusion   Intravenous New Bag/Given Annitta Needs, [place]     07/21/2020 1621 lidocaine (cardiac) 100 mg/82mL (XYLOCAINE) injection 2% 30 mg Intravenous Given Allean Found, CRNA     07/21/2020 1439 MEDLINE mouth rinse   Mouth Rinse See Alternative Annitta Needs, RN     07/21/2020 1619 midazolam (VERSED) injection 2 mg Intravenous Given Allean Found, CRNA     07/21/2020 1649 propofol (DIPRIVAN) 10  mg/mL bolus/IV push 20 mg Intravenous Given Allean Found, CRNA     07/21/2020 1644 propofol (DIPRIVAN) 10 mg/mL bolus/IV push 30 mg Intravenous Given Allean Found, CRNA     07/21/2020 1719 propofol (DIPRIVAN) 500 MG/50ML infusion 0 mcg/kg/min Intravenous Stopped Fletcher-Harrison, Tawana, CRNA     07/21/2020 1708 propofol (DIPRIVAN) 500 MG/50ML infusion 100 mcg/kg/min Intravenous Rate/Dose Change Fletcher-Harrison, Tawana, CRNA     07/21/2020 1654 propofol (DIPRIVAN) 500 MG/50ML infusion 150 mcg/kg/min Intravenous Rate/Dose Change Fletcher-Harrison, Tawana, CRNA     07/21/2020 1634 propofol (DIPRIVAN) 500 MG/50ML infusion 100 mcg/kg/min Intravenous Rate/Dose Change Allean Found, CRNA     07/21/2020 1626 propofol (DIPRIVAN) 500 MG/50ML infusion 85 mcg/kg/min Intravenous Rate/Dose Change Allean Found, CRNA     07/21/2020 1624 propofol (DIPRIVAN) 500 MG/50ML infusion 100 mcg/kg/min Intravenous New Bag/Given Allean Found, CRNA       Mobility {Mobility:20148}

## 2020-07-21 NOTE — H&P (Signed)
PATIENT PROFILE: Wendy Caldwell is a 65 y.o. female who presents to the OR for insertion of port a cath  PCP: Ouida Sills Vallarie Mare, MD  HISTORY OF PRESENT ILLNESS: [name] reports she felt a palpable area under her left nipple. She went to see her primary care. Mammogram and ultrasound, diagnostic was ordered. This showed a 2.3 cm mass at 1:00 under the left nipple areolar complex. This led to core needle biopsy. Core needle biopsy showed invasive mammary carcinoma. A suspicious lymph node was also biopsied but was negative for malignancy. Patient denies any breast pain. There is no pain radiation. There is no alleviating or aggravating factors.  Family history of breast cancer: Unknown Family history of other cancers: Unknown Menarche: 68 to 65 years old Menopause: Around 65 years old Number of pregnancies: 2 History of Radiation to the chest: None Previous breast biopsy: Yes benign cyst of the right breast.  PROBLEM LIST: Problem List Date Reviewed: 05/06/2020  Noted  GERD (gastroesophageal reflux disease) 05/06/2020  Psoriasis 05/06/2020  Healthcare maintenance 05/06/2020  Overview  Declines mmg, pap, colon referal 2-22      GENERAL REVIEW OF SYSTEMS:   General ROS: negative for - chills, fatigue, fever, weight gain or weight loss Allergy and Immunology ROS: negative for - hives  Hematological and Lymphatic ROS: negative for - bleeding problems or bruising, negative for palpable nodes Endocrine ROS: negative for - heat or cold intolerance, hair changes Respiratory ROS: negative for - cough, shortness of breath or wheezing Cardiovascular ROS: no chest pain or palpitations GI ROS: negative for nausea, vomiting, abdominal pain, diarrhea, constipation Musculoskeletal ROS: negative for - joint swelling or muscle pain Neurological ROS: negative for - confusion, syncope Dermatological ROS: negative for pruritus and rash Psychiatric: negative for anxiety, depression,  difficulty sleeping and memory loss  MEDICATIONS: Current Outpatient Medications  Medication Sig Dispense Refill  . CLARITIN-D 24 HOUR 10-240 mg ER tablet Take 1 tablet by mouth once daily as needed  . cyanocobalamin (VITAMIN B12) 1000 MCG tablet Take 1,000 mcg by mouth once daily  . HUMIRA,CF, PEN 40 mg/0.4 mL pen injector kit Inject 40 mg subcutaneously every 14 (fourteen) days  . MULTIVITAMIN ORAL Take 1 tablet by mouth once daily  . omeprazole (PRILOSEC) 40 MG DR capsule Take 1 capsule by mouth once daily  . sodium, potassium, and magnesium (SUPREP) oral solution Take 1 Bottle by mouth as directed One kit contains 2 bottles. Take both bottles at the times instructed by your provider. 354 mL 0  . vit C/E/Zn/coppr/lutein/zeaxan (PRESERVISION AREDS-2 ORAL) Take 1 capsule by mouth once daily  . ondansetron (ZOFRAN-ODT) 4 MG disintegrating tablet Take 1 tablet (4 mg total) by mouth every 8 (eight) hours as needed for Nausea or Vomiting (Patient not taking: Reported on 07/08/2020) 15 tablet 1   No current facility-administered medications for this visit.   ALLERGIES: Patient has no known allergies.  PAST MEDICAL HISTORY: Past Medical History:  Diagnosis Date  . GERD (gastroesophageal reflux disease)   PAST SURGICAL HISTORY: Past Surgical History:  Procedure Laterality Date  . CHOLECYSTECTOMY    FAMILY HISTORY: Family History  Adopted: Yes    SOCIAL HISTORY: Social History   Socioeconomic History  . Marital status: Married  Tobacco Use  . Smoking status: Former Smoker  Quit date: 2012  Years since quitting: 10.3  . Smokeless tobacco: Never Used  Substance and Sexual Activity  . Alcohol use: Yes  Alcohol/week: 2.0 standard drinks  Types: 1 Glasses of  wine, 1 Shots of liquor per week  . Drug use: Never   PHYSICAL EXAM: Vitals:  07/08/20 1345  BP: (!) 145/70  Pulse: 92   Body mass index is 33.11 kg/m. Weight: 82.1 kg (181 lb)   GENERAL: Alert, active, oriented  x3  HEENT: Pupils equal reactive to light. Extraocular movements are intact. Sclera clear. Palpebral conjunctiva normal red color.Pharynx clear.  NECK: Supple with no palpable mass and no adenopathy.  LUNGS: Sound clear with no rales rhonchi or wheezes.  HEART: Regular rhythm S1 and S2 without murmur.  BREAST: right breast normal without mass, skin or nipple changes or axillary nodes. Left breast with a palpable mass under the nipple areolar complex with retraction.  ABDOMEN: Soft and depressible, nontender with no palpable mass, no hepatomegaly.  EXTREMITIES: Well-developed well-nourished symmetrical with no dependent edema.  NEUROLOGICAL: Awake alert oriented, facial expression symmetrical, moving all extremities.  REVIEW OF DATA: I have reviewed the following data today: Initial consult on 06/30/2020  Component Date Value  . Protein, Total 06/30/2020 7.4  . Albumin 06/30/2020 4.2  . Bilirubin, Total 06/30/2020 0.7  . Bilirubin, Conjugated 06/30/2020 0.16  . Alk Phos (alkaline Phosp* 06/30/2020 93  . AST 06/30/2020 28  . ALT 06/30/2020 37  Ancillary Orders on 05/07/2020  Component Date Value  . Ferritin 05/07/2020 112  . Hepatitis B Surface Anti* 05/07/2020 Negative  . Hep B Surface Ab, Qual -* 05/07/2020 Non Reactive  . Hep B Core Total Ab - [place]* 05/07/2020 Negative  . HCV Ab - [facility] 05/07/2020 <0.1  . Interpretation: - [facility] 05/07/2020 Comment  Initial consult on 05/06/2020  Component Date Value  . Glucose 05/06/2020 87  . Sodium 05/06/2020 138  . Potassium 05/06/2020 4.5  . Chloride 05/06/2020 104  . Carbon Dioxide (CO2) 05/06/2020 30.4  . Urea Nitrogen (BUN) 05/06/2020 18  . Creatinine 05/06/2020 0.7  . Glomerular Filtration Ra* 05/06/2020 85  . Calcium 05/06/2020 9.7  . AST 05/06/2020 31  . ALT 05/06/2020 98 (!)  . Alk Phos (alkaline Phosp* 05/06/2020 156 (!)  . Albumin 05/06/2020 4.1  . Bilirubin, Total 05/06/2020 1.4 (!)  . Protein, Total 05/06/2020  7.2  . A/G Ratio 05/06/2020 1.3  . Lipase 05/06/2020 43  . WBC (White Blood Cell Co* 05/06/2020 8.5  . RBC (Red Blood Cell Coun* 05/06/2020 6.13 (!)  . Hemoglobin 05/06/2020 12.4  . Hematocrit 05/06/2020 39.8  . MCV (Mean Corpuscular Vo* 05/06/2020 64.9 (!)  . MCH (Mean Corpuscular He* 05/06/2020 20.2 (!)  . MCHC (Mean Corpuscular H* 05/06/2020 31.2 (!)  . Platelet Count 05/06/2020 404  . RDW-CV (Red Cell Distrib* 05/06/2020 15.2 (!)  . MPV (Mean Platelet Volum* 05/06/2020 9.5  . Neutrophils 05/06/2020 4.12  . Lymphocytes 05/06/2020 3.02  . Monocytes 05/06/2020 0.93  . Eosinophils 05/06/2020 0.30  . Basophils 05/06/2020 0.07  . Neutrophil % 05/06/2020 48.8  . Lymphocyte % 05/06/2020 35.7  . Monocyte % 05/06/2020 11.0  . Eosinophil % 05/06/2020 3.5  . Basophil% 05/06/2020 0.8  . Immature Granulocyte % 05/06/2020 0.2  . Immature Granulocyte Cou* 05/06/2020 0.02  . Hemoglobin A1C 05/06/2020 5.4  . Average Blood Glucose (C* 05/06/2020 108  . Cholesterol, Total 05/06/2020 190  . Triglyceride 05/06/2020 97  . HDL (High Density Lipopr* 05/06/2020 49.2  . LDL Calculated 05/06/2020 121  . VLDL Cholesterol 05/06/2020 19  . Cholesterol/HDL Ratio 05/06/2020 3.9  . H Pylori IgG - [facility] 05/06/2020 0.22    ASSESSMENT: [name] is a 65  y.o. female presenting for consultation for left breast cancer.   Patient was oriented again about the pathology results.   HER2 came back positive. Due to the size of the mass and being ER negative HER2 positive it was decided to proceed with neoadjuvant chemotherapy. Patient assisted to chemo class. Today she has no question about the procedure. Patient oriented about risks of surgery that includes: bleeding, infection, pneumothorax, pain, arteriovenous fistula, DVT, among others.   Malignant neoplasm of upper-outer quadrant of left female breast, unspecified estrogen receptor status (CMS-HCC) [C50.412]  PLAN: 1. Insertion of Port a Cath   Herbert Pun, MD

## 2020-07-21 NOTE — Transfer of Care (Signed)
Immediate Anesthesia Transfer of Care Note  Patient: Wendy Caldwell  Procedure(s) Performed: INSERTION PORT-A-CATH (Right Chest)  Patient Location: PACU  Anesthesia Type:General  Level of Consciousness: awake, alert , oriented and patient cooperative  Airway & Oxygen Therapy: Patient Spontanous Breathing  Post-op Assessment: Report given to RN and Post -op Vital signs reviewed and stable  Post vital signs: Reviewed and stable  Last Vitals:  Vitals Value Taken Time  BP 117/69 07/21/20 1725  Temp    Pulse 79 07/21/20 1729  Resp 14 07/21/20 1729  SpO2 97 % 07/21/20 1729  Vitals shown include unvalidated device data.  Last Pain:  Vitals:   07/21/20 1417  TempSrc: Temporal  PainSc: 0-No pain         Complications: No complications documented.

## 2020-07-21 NOTE — Op Note (Signed)
SURGICAL PROCEDURE REPORT  DATE OF PROCEDURE: 07/21/2020   SURGEON: Dr. Windell Moment   ANESTHESIA: Local with light IV sedation   PRE-OPERATIVE DIAGNOSIS: Advanced breast cancer requiring durable central venous access for chemotherapy   POST-OPERATIVE DIAGNOSIS: Advanced breast cancer requiring durable central venous access for chemotherapy   PROCEDURE(S): 1.) Percutaneous access of Right internal jugular vein under ultrasound guidance 2.) Insertion of tunneled Right internal jugular central venous catheter with subcutaneous port  INTRAOPERATIVE FINDINGS: Patent easily compressible right internal jugular vein with appropriate respiratory variations and well-secured tunneled central venous catheter with subcutaneous port at completion of the procedure  ESTIMATED BLOOD LOSS: Minimal   SPECIMENS: None   IMPLANTS: 45F tunneled Bard PowerPort central venous catheter with subcutaneous port  DRAINS: None   COMPLICATIONS: None apparent   CONDITION AT COMPLETION: Hemodynamically stable, awake   DISPOSITION: PACU   INDICATION(S) FOR PROCEDURE:  Patient is a 65 y.o. female who presented with advanced breast cancer requiring durable central venous access for chemotherapy. All risks, benefits, and alternatives to above elective procedures were discussed with the patient, who elected to proceed, and informed consent was accordingly obtained at that time.  DETAILS OF PROCEDURE:  Patient was brought to the operative suite and appropriately identified. In Trendelenburg position, Right IJ venous access site was prepped and draped in the usual sterile fashion, and following a brief timeout, percutaneous Right IJ venous access was obtained under ultrasound guidance using Seldinger technique, by which local anesthetic was injected over the Right IJ vein, and access needle was inserted under direct ultrasound visualization into the Right IJ vein, through which soft guidewire was advanced, over which  access needle was withdrawn. Guidewire was secured, attention was directed to injection of local anesthetic along the planned tunnel site, 2-3 cm transverse Right chest incision was made and confirmed to accommodate the subcutaneous port, and flushed catheter was tunneled retrograde from the port site over the Right chest to the Right IJ access site with the attached port well-secured to the catheter and within the subcutaneous pocket. Insertion sheath was advanced over the guidewire, which was withdrawn along with the insertion sheath dilator. The catheter was introduced through the sheath and left on the [place] junction under fluoro guidance and catheter cut to desire lenght. Catheter connected to port and fixed to the pocket on two side to avoid twisting. Port was confirmed to withdraw blood and flush easily, after which concentrated heparin was instilled into the port and catheter. Dermis at the subcutaneous pocket was re-approximated using buried interrupted 3-0 Vicryl suture, and 4-0 Monocryl suture was used to re-approximate skin at the insertion and subcutaneous port sites in running subcuticular fashion for the subcutaneous port and buried interrupted fashion for the insertion site. Skin was cleaned, dried, and sterile skin glue was applied. Patient was then safely transferred to PACU for a chest x-ray. Ultrasound images are available on paper chart and Fluoroscopy guidance images are available in Epic.

## 2020-07-22 ENCOUNTER — Encounter: Payer: Self-pay | Admitting: General Surgery

## 2020-07-22 ENCOUNTER — Other Ambulatory Visit: Payer: Self-pay | Admitting: Oncology

## 2020-07-23 ENCOUNTER — Encounter: Payer: Self-pay | Admitting: Oncology

## 2020-07-23 ENCOUNTER — Inpatient Hospital Stay: Payer: BC Managed Care – PPO

## 2020-07-23 ENCOUNTER — Inpatient Hospital Stay (HOSPITAL_BASED_OUTPATIENT_CLINIC_OR_DEPARTMENT_OTHER): Payer: BC Managed Care – PPO | Admitting: Oncology

## 2020-07-23 VITALS — BP 115/48 | HR 78 | Temp 98.0°F | Resp 18

## 2020-07-23 VITALS — BP 139/68 | HR 92 | Temp 97.1°F | Wt 183.9 lb

## 2020-07-23 DIAGNOSIS — Z5112 Encounter for antineoplastic immunotherapy: Secondary | ICD-10-CM

## 2020-07-23 DIAGNOSIS — Z5111 Encounter for antineoplastic chemotherapy: Secondary | ICD-10-CM | POA: Diagnosis not present

## 2020-07-23 DIAGNOSIS — C50412 Malignant neoplasm of upper-outer quadrant of left female breast: Secondary | ICD-10-CM

## 2020-07-23 DIAGNOSIS — Z9225 Personal history of immunosupression therapy: Secondary | ICD-10-CM | POA: Diagnosis not present

## 2020-07-23 DIAGNOSIS — Z170 Estrogen receptor positive status [ER+]: Secondary | ICD-10-CM | POA: Diagnosis not present

## 2020-07-23 DIAGNOSIS — Z298 Encounter for other specified prophylactic measures: Secondary | ICD-10-CM | POA: Diagnosis not present

## 2020-07-23 DIAGNOSIS — Z5189 Encounter for other specified aftercare: Secondary | ICD-10-CM | POA: Diagnosis not present

## 2020-07-23 DIAGNOSIS — Z87891 Personal history of nicotine dependence: Secondary | ICD-10-CM | POA: Diagnosis not present

## 2020-07-23 LAB — CBC WITH DIFFERENTIAL/PLATELET
Abs Immature Granulocytes: 0.06 10*3/uL (ref 0.00–0.07)
Basophils Absolute: 0 10*3/uL (ref 0.0–0.1)
Basophils Relative: 0 %
Eosinophils Absolute: 0 10*3/uL (ref 0.0–0.5)
Eosinophils Relative: 0 %
HCT: 38.4 % (ref 36.0–46.0)
Hemoglobin: 12.3 g/dL (ref 12.0–15.0)
Immature Granulocytes: 0 %
Lymphocytes Relative: 19 %
Lymphs Abs: 2.8 10*3/uL (ref 0.7–4.0)
MCH: 20.3 pg — ABNORMAL LOW (ref 26.0–34.0)
MCHC: 32 g/dL (ref 30.0–36.0)
MCV: 63.5 fL — ABNORMAL LOW (ref 80.0–100.0)
Monocytes Absolute: 1.2 10*3/uL — ABNORMAL HIGH (ref 0.1–1.0)
Monocytes Relative: 8 %
Neutro Abs: 11 10*3/uL — ABNORMAL HIGH (ref 1.7–7.7)
Neutrophils Relative %: 73 %
Platelets: 343 10*3/uL (ref 150–400)
RBC: 6.05 MIL/uL — ABNORMAL HIGH (ref 3.87–5.11)
RDW: 16.7 % — ABNORMAL HIGH (ref 11.5–15.5)
WBC: 15.1 10*3/uL — ABNORMAL HIGH (ref 4.0–10.5)
nRBC: 0.1 % (ref 0.0–0.2)

## 2020-07-23 LAB — HEPATITIS B CORE ANTIBODY, TOTAL: Hep B Core Total Ab: NONREACTIVE

## 2020-07-23 LAB — COMPREHENSIVE METABOLIC PANEL
ALT: 25 U/L (ref 0–44)
AST: 30 U/L (ref 15–41)
Albumin: 4.1 g/dL (ref 3.5–5.0)
Alkaline Phosphatase: 96 U/L (ref 38–126)
Anion gap: 11 (ref 5–15)
BUN: 16 mg/dL (ref 8–23)
CO2: 23 mmol/L (ref 22–32)
Calcium: 9.6 mg/dL (ref 8.9–10.3)
Chloride: 101 mmol/L (ref 98–111)
Creatinine, Ser: 0.96 mg/dL (ref 0.44–1.00)
GFR, Estimated: >60 mL/min (ref >60)
Glucose, Bld: 124 mg/dL — ABNORMAL HIGH (ref 70–99)
Potassium: 3.5 mmol/L (ref 3.5–5.1)
Sodium: 135 mmol/L (ref 135–145)
Total Bilirubin: 0.7 mg/dL (ref 0.3–1.2)
Total Protein: 7.7 g/dL (ref 6.5–8.1)

## 2020-07-23 LAB — HEPATITIS B SURFACE ANTIGEN: Hepatitis B Surface Ag: NONREACTIVE

## 2020-07-23 MED ORDER — SODIUM CHLORIDE 0.9 % IV SOLN
10.0000 mg | Freq: Once | INTRAVENOUS | Status: AC
  Administered 2020-07-23: 10 mg via INTRAVENOUS
  Filled 2020-07-23: qty 10

## 2020-07-23 MED ORDER — PEGFILGRASTIM 6 MG/0.6ML ~~LOC~~ PSKT
6.0000 mg | PREFILLED_SYRINGE | Freq: Once | SUBCUTANEOUS | Status: AC
  Administered 2020-07-23: 6 mg via SUBCUTANEOUS
  Filled 2020-07-23: qty 0.6

## 2020-07-23 MED ORDER — TRASTUZUMAB-DKST CHEMO 150 MG IV SOLR
8.0000 mg/kg | Freq: Once | INTRAVENOUS | Status: AC
  Administered 2020-07-23: 672 mg via INTRAVENOUS
  Filled 2020-07-23: qty 20

## 2020-07-23 MED ORDER — SODIUM CHLORIDE 0.9 % IV SOLN
150.0000 mg | Freq: Once | INTRAVENOUS | Status: AC
  Administered 2020-07-23: 150 mg via INTRAVENOUS
  Filled 2020-07-23: qty 150

## 2020-07-23 MED ORDER — ACETAMINOPHEN 325 MG PO TABS
650.0000 mg | ORAL_TABLET | Freq: Once | ORAL | Status: AC
  Administered 2020-07-23: 650 mg via ORAL
  Filled 2020-07-23: qty 2

## 2020-07-23 MED ORDER — HEPARIN SOD (PORK) LOCK FLUSH 100 UNIT/ML IV SOLN
500.0000 [IU] | Freq: Once | INTRAVENOUS | Status: AC | PRN
  Administered 2020-07-23: 500 [IU]
  Filled 2020-07-23: qty 5

## 2020-07-23 MED ORDER — HEPARIN SOD (PORK) LOCK FLUSH 100 UNIT/ML IV SOLN
INTRAVENOUS | Status: AC
  Filled 2020-07-23: qty 5

## 2020-07-23 MED ORDER — SODIUM CHLORIDE 0.9 % IV SOLN
840.0000 mg | Freq: Once | INTRAVENOUS | Status: AC
  Administered 2020-07-23: 840 mg via INTRAVENOUS
  Filled 2020-07-23: qty 28

## 2020-07-23 MED ORDER — SODIUM CHLORIDE 0.9 % IV SOLN
Freq: Once | INTRAVENOUS | Status: AC
  Filled 2020-07-23: qty 250

## 2020-07-23 MED ORDER — DIPHENHYDRAMINE HCL 25 MG PO CAPS
50.0000 mg | ORAL_CAPSULE | Freq: Once | ORAL | Status: AC
  Administered 2020-07-23: 50 mg via ORAL
  Filled 2020-07-23: qty 2

## 2020-07-23 MED ORDER — SODIUM CHLORIDE 0.9 % IV SOLN
75.0000 mg/m2 | Freq: Once | INTRAVENOUS | Status: AC
  Administered 2020-07-23: 140 mg via INTRAVENOUS
  Filled 2020-07-23: qty 14

## 2020-07-23 MED ORDER — SODIUM CHLORIDE 0.9 % IV SOLN
531.5000 mg | Freq: Once | INTRAVENOUS | Status: AC
  Administered 2020-07-23: 530 mg via INTRAVENOUS
  Filled 2020-07-23: qty 53

## 2020-07-23 MED ORDER — PALONOSETRON HCL INJECTION 0.25 MG/5ML
0.2500 mg | Freq: Once | INTRAVENOUS | Status: AC
  Administered 2020-07-23: 0.25 mg via INTRAVENOUS
  Filled 2020-07-23: qty 5

## 2020-07-23 NOTE — Progress Notes (Signed)
Hematology/Oncology Consult note [facility]  Telephone:([phone] [phone]  Patient Care Team: Kirk Ruths, MD as PCP - General (Internal Medicine) Rico Junker, RN as Oncology Nurse Navigator   Name of the patient: Wendy Caldwell  [id number]  03-Jun-1955   Date of visit: 07/23/20  Diagnosis- left breast cancer clinical prognostic stage IIA cT2 N0 M0 ER weakly positive, PR negative HER2 positive  Chief complaint/ Reason for visit-on treatment assessment prior to cycle 1 of neoadjuvant TCHP chemotherapy  Heme/Onc history: Patient is a 65 year old female with a past medical history significant for psoriasis for which she is on Humira.  She self palpated a left breast mass which prompted a bilateral diagnostic mammogram on 06/25/2020.  That showed an irregular hypoechoic mass 2.5 x 1.7 x 1.8 cm at 1 o'clock position 1 cm from the nipple.  3 cm from the nipple were benign cysts.  1 mildly enlarged left axillary lymph node with cortical thickening 4 mm.  Both the breast mass and the lymph node were biopsied.  Left breast biopsy was consistent with invasive mammary carcinoma grade 3 ER weakly +1 to 10%, PR negative and HER2 positive by IHC.   Patient does not know her family history as she was adopted.  She married 2 months ago.  Menarche at the age of 65.  She is to adult daughters.  First child in her 34s.  She was previously using birth control.  Last menstrual period in her mid 70s.  No use of hormonal birth placement therapy.  She has had a breast biopsy years ago for right breast cysts.  Plan is to proceed with 6 cycles of neoadjuvant TCHP chemotherapy followed by surgery   Interval history-patient is doing well and denies any specific complaints at this time.  She did get her first dose of Humira 2 days ago.  ECOG PS- 1 Pain scale- 0  Review of systems- Review of Systems  Constitutional: Negative for chills, fever, malaise/fatigue  and weight loss.  HENT: Negative for congestion, ear discharge and nosebleeds.   Eyes: Negative for blurred vision.  Respiratory: Negative for cough, hemoptysis, sputum production, shortness of breath and wheezing.   Cardiovascular: Negative for chest pain, palpitations, orthopnea and claudication.  Gastrointestinal: Negative for abdominal pain, blood in stool, constipation, diarrhea, heartburn, melena, nausea and vomiting.  Genitourinary: Negative for dysuria, flank pain, frequency, hematuria and urgency.  Musculoskeletal: Negative for back pain, joint pain and myalgias.  Skin: Negative for rash.  Neurological: Negative for dizziness, tingling, focal weakness, seizures, weakness and headaches.  Endo/Heme/Allergies: Does not bruise/bleed easily.  Psychiatric/Behavioral: Negative for depression and suicidal ideas. The patient does not have insomnia.        No Known Allergies   Past Medical History:  Diagnosis Date  . Family history of adverse reaction to anesthesia    adopted  . GERD (gastroesophageal reflux disease)      Past Surgical History:  Procedure Laterality Date  . BREAST BIOPSY Left 07/05/2020   [place] Bx, Q clip, path pending  . BREAST BIOPSY Left 07/05/2020   [place] Bx, Vision, path pending   . CARPAL TUNNEL RELEASE Bilateral   . CHOLECYSTECTOMY    . GANGLION CYST EXCISION     wrist  . PORTACATH PLACEMENT Right 07/21/2020   Procedure: INSERTION PORT-A-CATH;  Surgeon: Herbert Pun, MD;  Location: [facility] ORS;  Service: General;  Laterality: Right;  . right ovary removal      Social History  Socioeconomic History  . Marital status: Married    Spouse name: Not on file  . Number of children: Not on file  . Years of education: Not on file  . Highest education level: Not on file  Occupational History  . Not on file  Tobacco Use  . Smoking status: Former Smoker    Start date: 2012  . Smokeless tobacco: Never Used  Vaping Use  . Vaping Use: Never used   Substance and Sexual Activity  . Alcohol use: Yes    Comment: occassional   . Drug use: Never  . Sexual activity: Not on file  Other Topics Concern  . Not on file  Social History Narrative  . Not on file   Social Determinants of Health   Financial Resource Strain: Not on file  Food Insecurity: Not on file  Transportation Needs: Not on file  Physical Activity: Not on file  Stress: Not on file  Social Connections: Not on file  Intimate Partner Violence: Not on file    Family History  Adopted: Yes  Problem Relation Age of Onset  . Breast cancer Neg Hx      Current Outpatient Medications:  .  Adalimumab (HUMIRA) 40 MG/0.8ML PSKT, Inject 40 mg into the skin every 14 (fourteen) days., Disp: , Rfl:  .  dexamethasone (DECADRON) 4 MG tablet, Take 2 tablets (8 mg total) by mouth 2 (two) times daily. Start the day before Taxotere. Then take daily x 3 days after chemotherapy., Disp: 30 tablet, Rfl: 1 .  HYDROcodone-acetaminophen (NORCO) 5-325 MG tablet, Take 1 tablet by mouth every 4 (four) hours as needed for up to 3 days for moderate pain., Disp: 16 tablet, Rfl: 0 .  lidocaine-prilocaine (EMLA) cream, Apply to affected area once (Patient taking differently: Apply 1 application topically daily as needed (prior to port access).), Disp: 30 g, Rfl: 3 .  loratadine (CLARITIN) 10 MG tablet, Take 10 mg by mouth daily., Disp: , Rfl:  .  LORazepam (ATIVAN) 0.5 MG tablet, Take 1 tablet (0.5 mg total) by mouth every 6 (six) hours as needed (Nausea or vomiting)., Disp: 30 tablet, Rfl: 0 .  omeprazole (PRILOSEC) 40 MG capsule, Take 40 mg by mouth daily., Disp: , Rfl:  .  ondansetron (ZOFRAN) 8 MG tablet, Take 1 tablet (8 mg total) by mouth 2 (two) times daily as needed (Nausea or vomiting). Start on the third day after chemotherapy., Disp: 30 tablet, Rfl: 1 .  prochlorperazine (COMPAZINE) 10 MG tablet, Take 1 tablet (10 mg total) by mouth every 6 (six) hours as needed (Nausea or vomiting)., Disp: 30  tablet, Rfl: 1 .  vitamin B-12 (CYANOCOBALAMIN) 1000 MCG tablet, Take 1,000 mcg by mouth daily., Disp: , Rfl:  .  vitamin C (ASCORBIC ACID) 500 MG tablet, Take 500 mg by mouth daily., Disp: , Rfl:   Physical exam:  Vitals:   07/23/20 0836  BP: 139/68  Pulse: 92  Temp: (!) 97.1 F (36.2 C)  TempSrc: Tympanic  SpO2: 100%  Weight: 183 lb 14.4 oz (83.4 kg)   Physical Exam Cardiovascular:     Rate and Rhythm: Normal rate and regular rhythm.     Heart sounds: Normal heart sounds.     Comments: Right chest wall port in place Pulmonary:     Effort: Pulmonary effort is normal.     Breath sounds: Normal breath sounds.  Abdominal:     General: Bowel sounds are normal.     Palpations: Abdomen is soft.  Musculoskeletal:  Cervical back: Normal range of motion.  Skin:    General: Skin is warm and dry.  Neurological:     Mental Status: She is alert and oriented to person, place, and time.      CMP Latest Ref Rng & Units 07/23/2020  Glucose 70 - 99 mg/dL 124(H)  BUN 8 - 23 mg/dL 16  Creatinine 0.44 - 1.00 mg/dL 0.96  Sodium 135 - 145 mmol/L 135  Potassium 3.5 - 5.1 mmol/L 3.5  Chloride 98 - 111 mmol/L 101  CO2 22 - 32 mmol/L 23  Calcium 8.9 - 10.3 mg/dL 9.6  Total Protein 6.5 - 8.1 g/dL 7.7  Total Bilirubin 0.3 - 1.2 mg/dL 0.7  Alkaline Phos 38 - 126 U/L 96  AST 15 - 41 U/L 30  ALT 0 - 44 U/L 25   CBC Latest Ref Rng & Units 07/23/2020  WBC 4.0 - 10.5 K/uL 15.1(H)  Hemoglobin 12.0 - 15.0 g/dL 12.3  Hematocrit 36.0 - 46.0 % 38.4  Platelets 150 - 400 K/uL 343    No images are attached to the encounter.  DG Chest Port 1 View  Result Date: 07/21/2020 CLINICAL DATA:  Port-A-Cath insertion EXAM: PORTABLE CHEST 1 VIEW COMPARISON:  None. FINDINGS: Right-sided central venous port tip over the SVC origin. No right pneumothorax. No consolidation or effusion. Coarse interstitial opacity which could be secondary to bronchitic changes. Normal heart size. IMPRESSION: 1. Right-sided  central venous port tip over the SVC origin. No pneumothorax 2. Bronchitic changes Electronically Signed   By: Donavan Foil M.D.   On: 07/21/2020 17:55   DG C-Arm 1-60 Min-No Report  Result Date: 07/21/2020 Fluoroscopy was utilized by the requesting physician.  No radiographic interpretation.   US BREAST LTD UNI LEFT INC AXILLA  Result Date: 06/25/2020 CLINICAL DATA:  Here for evaluation of a palpable area felt by the patient in the left breast. EXAM: DIGITAL DIAGNOSTIC BILATERAL MAMMOGRAM WITH TOMOSYNTHESIS AND CAD; ULTRASOUND LEFT BREAST LIMITED TECHNIQUE: Bilateral digital diagnostic mammography and breast tomosynthesis was performed. The images were evaluated with computer-aided detection.; Targeted ultrasound examination of the left breast was performed COMPARISON:  Previous exam(s). ACR Breast Density Category c: The breast tissue is heterogeneously dense, which may obscure small masses. FINDINGS: Additional views including spot compression demonstrate an irregular 2.3 cm mass with associated microcalcifications in the upper outer retroareolar left breast at anterior depth. This underlies a palpable skin marker. The mass extends to the base of the nipple in there is associated nipple retraction. Three adjacent 4 mm masses are seen in the upper inner left breast at anterior depth. No suspicious mass, microcalcification, or other finding is identified in the right breast. Targeted left breast ultrasound was performed. At 1 o'clock 1 cm from the nipple an irregular hypoechoic mass measures 2.5 x 1.7 x 1.8 cm. This corresponds to the palpable area felt by the patient and the mass seen on the mammogram. This mass extends to the base of the nipple and there is nipple retraction on physical exam. At 10:30 o'clock 3 cm from the nipple three nearby benign cysts measure 3-4 mm in diameter and correspond to the 3 masses seen on the mammogram. One mildly enlarged left axillary lymph node demonstrates cortical  thickening measuring 4 mm. IMPRESSION: 1. Irregular mass in the left breast and a mildly enlarged left axillary lymph node are suspicious for malignancy and metastatic disease. 2.  No evidence of malignancy in the right breast. RECOMMENDATION: Recommend ultrasound-guided biopsy of the mass in  the left breast and ultrasound-guided biopsy of the mildly enlarged lymph node in the left axilla. I have discussed the findings and recommendations with the patient. If applicable, a reminder letter will be sent to the patient regarding the next appointment. BI-RADS CATEGORY  4: Suspicious. Electronically Signed   By: Zerita Boers M.D.   On: 06/25/2020 11:54   MM DIAG BREAST TOMO BILATERAL  Result Date: 06/25/2020 CLINICAL DATA:  Here for evaluation of a palpable area felt by the patient in the left breast. EXAM: DIGITAL DIAGNOSTIC BILATERAL MAMMOGRAM WITH TOMOSYNTHESIS AND CAD; ULTRASOUND LEFT BREAST LIMITED TECHNIQUE: Bilateral digital diagnostic mammography and breast tomosynthesis was performed. The images were evaluated with computer-aided detection.; Targeted ultrasound examination of the left breast was performed COMPARISON:  Previous exam(s). ACR Breast Density Category c: The breast tissue is heterogeneously dense, which may obscure small masses. FINDINGS: Additional views including spot compression demonstrate an irregular 2.3 cm mass with associated microcalcifications in the upper outer retroareolar left breast at anterior depth. This underlies a palpable skin marker. The mass extends to the base of the nipple in there is associated nipple retraction. Three adjacent 4 mm masses are seen in the upper inner left breast at anterior depth. No suspicious mass, microcalcification, or other finding is identified in the right breast. Targeted left breast ultrasound was performed. At 1 o'clock 1 cm from the nipple an irregular hypoechoic mass measures 2.5 x 1.7 x 1.8 cm. This corresponds to the palpable area felt by  the patient and the mass seen on the mammogram. This mass extends to the base of the nipple and there is nipple retraction on physical exam. At 10:30 o'clock 3 cm from the nipple three nearby benign cysts measure 3-4 mm in diameter and correspond to the 3 masses seen on the mammogram. One mildly enlarged left axillary lymph node demonstrates cortical thickening measuring 4 mm. IMPRESSION: 1. Irregular mass in the left breast and a mildly enlarged left axillary lymph node are suspicious for malignancy and metastatic disease. 2.  No evidence of malignancy in the right breast. RECOMMENDATION: Recommend ultrasound-guided biopsy of the mass in the left breast and ultrasound-guided biopsy of the mildly enlarged lymph node in the left axilla. I have discussed the findings and recommendations with the patient. If applicable, a reminder letter will be sent to the patient regarding the next appointment. BI-RADS CATEGORY  4: Suspicious. Electronically Signed   By: Zerita Boers M.D.   On: 06/25/2020 11:54   ECHOCARDIOGRAM COMPLETE  Result Date: 07/20/2020    ECHOCARDIOGRAM REPORT   Patient Name:   CHEVON FOMBY [facility] Date of Exam: 07/20/2020 Medical Rec #:  [id number]          Height:       63.0 in Accession #:    [id number]         Weight:       181.0 lb Date of Birth:  06/29/1955         BSA:          1.853 m Patient Age:    15 years           BP:           145/70 mmHg Patient Gender: F                  HR:           82 bpm. Exam Location:  [facility] Procedure: 2D Echo, Color Doppler, Cardiac Doppler and Strain Analysis Indications:  Z09 Chemo  History:         Patient has no prior history of Echocardiogram examinations.                  Malignant neoplasm of upper-outer quadrant of left breast in                  female, estrogen receptor positive.  Sonographer:     Charmayne Sheer RDCS (AE) Referring Phys:  [id number] Weston Anna Lott Seelbach Diagnosing Phys: Kate Sable MD  Sonographer Comments: Suboptimal parasternal window. Global  longitudinal strain was attempted. IMPRESSIONS  1. Left ventricular ejection fraction, by estimation, is 60 to 65%. The left ventricle has normal function. The left ventricle has no regional wall motion abnormalities. Left ventricular diastolic parameters were normal. The average left ventricular global longitudinal strain is -25.3 %. The global longitudinal strain is normal.  2. Right ventricular systolic function is normal. The right ventricular size is normal.  3. The mitral valve is normal in structure. Mild mitral valve regurgitation.  4. The aortic valve was not well visualized. Aortic valve regurgitation is not visualized.  5. The inferior vena cava is normal in size with greater than 50% respiratory variability, suggesting right atrial pressure of 3 mmHg. FINDINGS  Left Ventricle: Left ventricular ejection fraction, by estimation, is 60 to 65%. The left ventricle has normal function. The left ventricle has no regional wall motion abnormalities. The average left ventricular global longitudinal strain is -25.3 %. The global longitudinal strain is normal. The left ventricular internal cavity size was normal in size. There is no left ventricular hypertrophy. Left ventricular diastolic parameters were normal. Right Ventricle: The right ventricular size is normal. No increase in right ventricular wall thickness. Right ventricular systolic function is normal. Left Atrium: Left atrial size was normal in size. Right Atrium: Right atrial size was normal in size. Pericardium: There is no evidence of pericardial effusion. Mitral Valve: The mitral valve is normal in structure. Mild mitral valve regurgitation. MV peak gradient, 2.9 mmHg. The mean mitral valve gradient is 1.0 mmHg. Tricuspid Valve: The tricuspid valve is normal in structure. Tricuspid valve regurgitation is not demonstrated. Aortic Valve: The aortic valve was not well visualized. Aortic valve regurgitation is not visualized. Aortic valve mean gradient  measures 4.0 mmHg. Aortic valve peak gradient measures 8.4 mmHg. Aortic valve area, by VTI measures 1.93 cm. Pulmonic Valve: The pulmonic valve was not well visualized. Pulmonic valve regurgitation is not visualized. Aorta: The aortic root is normal in size and structure. Venous: The inferior vena cava is normal in size with greater than 50% respiratory variability, suggesting right atrial pressure of 3 mmHg. IAS/Shunts: No atrial level shunt detected by color flow Doppler.  LEFT VENTRICLE PLAX 2D LVIDd:         3.30 cm  Diastology LVIDs:         2.30 cm  LV e' medial:    8.38 cm/s LV PW:         0.90 cm  LV E/e' medial:  9.2 LV IVS:        0.80 cm  LV e' lateral:   12.20 cm/s LVOT diam:     1.70 cm  LV E/e' lateral: 6.3 LV SV:         55 LV SV Index:   30       2D Longitudinal Strain LVOT Area:     2.27 cm 2D Strain GLS Avg:     -25.3 %  RIGHT  VENTRICLE RV Basal diam:  2.30 cm TAPSE (M-mode): 3.2 cm LEFT ATRIUM           Index       RIGHT ATRIUM           Index LA diam:      3.20 cm 1.73 cm/m  RA Area:     11.20 cm LA Vol (A2C): 38.5 ml 20.77 ml/m RA Volume:   23.20 ml  12.52 ml/m LA Vol (A4C): 20.8 ml 11.22 ml/m  AORTIC VALVE                   PULMONIC VALVE AV Area (Vmax):    1.72 cm    PV Vmax:       1.09 m/s AV Area (Vmean):   1.71 cm    PV Vmean:      76.900 cm/s AV Area (VTI):     1.93 cm    PV VTI:        0.233 m AV Vmax:           145.00 cm/s PV Peak grad:  4.8 mmHg AV Vmean:          97.800 cm/s PV Mean grad:  3.0 mmHg AV VTI:            0.286 m AV Peak Grad:      8.4 mmHg AV Mean Grad:      4.0 mmHg LVOT Vmax:         110.00 cm/s LVOT Vmean:        73.500 cm/s LVOT VTI:          0.243 m LVOT/AV VTI ratio: 0.85  AORTA Ao Root diam: 2.50 cm MITRAL VALVE MV Area (PHT): 3.54 cm    SHUNTS MV Area VTI:   2.64 cm    Systemic VTI:  0.24 m MV Peak grad:  2.9 mmHg    Systemic Diam: 1.70 cm MV Mean grad:  1.0 mmHg MV Vmax:       0.85 m/s MV Vmean:      54.1 cm/s MV Decel Time: 214 msec MV E velocity:  77.10 cm/s MV A velocity: 63.40 cm/s MV E/A ratio:  1.22 Kate Sable MD Electronically signed by Kate Sable MD Signature Date/Time: 07/20/2020/2:11:37 PM    Final    MM CLIP PLACEMENT LEFT  Result Date: 07/05/2020 CLINICAL DATA:  Evaluate post biopsy marker clip placement following ultrasound-guided core needle biopsy of a retroareolar left breast mass and a left axillary lymph node. EXAM: DIAGNOSTIC LEFT MAMMOGRAM POST ULTRASOUND BIOPSY COMPARISON:  Previous exam(s). FINDINGS: Mammographic images were obtained following ultrasound guided biopsy of a retroareolar left breast mass and a left axillary lymph node. The Q shaped biopsy clip lies within the retroareolar mass. The vision clip, placed within the left axillary lymph node, cannot be visualized mammographically. IMPRESSION: Appropriate positioning of the type shaped biopsy marking clip at the site of biopsy in the Q retroareolar left breast. Final Assessment: Post Procedure Mammograms for Marker Placement Electronically Signed   By: Lajean Manes M.D.   On: 07/05/2020 09:06   [place] LT BREAST BX W LOC DEV 1ST LESION IMG BX SPEC US GUIDE  Addendum Date: 07/09/2020   ADDENDUM REPORT: 07/09/2020 12:48 ADDENDUM: PATHOLOGY revealed: Site A. LEFT BREAST; ULTRASOUND-GUIDED BIOPSY: - INVASIVE MAMMARY CARCINOMA, NO SPECIAL TYPE. Size of invasive carcinoma: 13 mm in this sample. Grade 3. Ductal carcinoma in situ: Not identified. Lymphovascular invasion: Not identified. Comment: The definitive grade will be  assigned on the excisional specimen. Pathology results are CONCORDANT with imaging findings, per Dr. Lajean Manes. PATHOLOGY revealed: Site B. LYMPH NODE, LEFT AXILLARY; ULTRASOUND-GUIDED BIOPSY: - CORES OF BENIGN LYMPH NODE, NEGATIVE FOR MALIGNANCY IN THIS SAMPLE. Pathology results are CONCORDANT with imaging findings, per Dr. Lajean Manes. Pathology results and recommendations below were discussed with patient by telephone on 07/06/2020. Patient  reported biopsy site within normal limits with slight tenderness at the site. Post biopsy care instructions were reviewed, questions were answered and my direct phone number was provided to patient. Patient was instructed to call [facility] if any concerns or questions arise related to the biopsy. Recommend surgical consultation for Site A only (LEFT breast mass). Notified Al Pimple RN and Tanya Nones RN at [facility] on 07/06/2020, that patient requests her provider (Dr. Frazier Richards) make her surgical consultation. Pathology results reported by Electa Sniff RN on 07/09/2020. Electronically Signed   By: Lajean Manes M.D.   On: 07/09/2020 12:48   Result Date: 07/09/2020 CLINICAL DATA:  Patient presents for ultrasound-guided core needle biopsy of a retroareolar left breast mass and a single borderline abnormal left axillary lymph node. EXAM: ULTRASOUND GUIDED LEFT BREAST CORE NEEDLE BIOPSY ULTRASOUND GUIDED LEFT AXILLARY LYMPH NODE CORE NEEDLE BIOPSY COMPARISON:  Previous exam(s). PROCEDURE: I met with the patient and we discussed the procedure of ultrasound-guided biopsy, including benefits and alternatives. We discussed the high likelihood of a successful procedure. We discussed the risks of the procedure, including infection, bleeding, tissue injury, clip migration, and inadequate sampling. Informed written consent was given. The usual time-out protocol was performed immediately prior to the procedure. Biopsy #1: 2.5 cm mass at 1 o'clock, 1 cm from the nipple. Lesion quadrant: Upper outer quadrant Using sterile technique and 1% Lidocaine as local anesthetic, under direct ultrasound visualization, a 12 gauge spring-loaded device was used to perform biopsy of retroareolar left breast mass using a medial approach. At the conclusion of the procedure Q shaped tissue marker clip was deployed into the biopsy cavity. Biopsy #2: Left axillary lymph node. Lesion location: Left  axilla Using sterile technique and 1% Lidocaine as local anesthetic, under direct ultrasound visualization, a 14 gauge spring-loaded device was used to perform biopsy of the lymph node with the 4 mm mildly thickened cortex using an inferior approach. At the conclusion of the procedure vision tissue marker clip was deployed into the biopsy cavity. Follow up 2 view mammogram was performed and dictated separately. IMPRESSION: Ultrasound guided biopsy of a retroareolar left breast mass and a single left axillary lymph node. No apparent complications. Electronically Signed: By: Lajean Manes M.D. On: 07/05/2020 09:11   [place] LT BREAST BX W LOC DEV EA ADD LESION IMG BX SPEC US GUIDE  Addendum Date: 07/09/2020   ADDENDUM REPORT: 07/09/2020 12:48 ADDENDUM: PATHOLOGY revealed: Site A. LEFT BREAST; ULTRASOUND-GUIDED BIOPSY: - INVASIVE MAMMARY CARCINOMA, NO SPECIAL TYPE. Size of invasive carcinoma: 13 mm in this sample. Grade 3. Ductal carcinoma in situ: Not identified. Lymphovascular invasion: Not identified. Comment: The definitive grade will be assigned on the excisional specimen. Pathology results are CONCORDANT with imaging findings, per Dr. Lajean Manes. PATHOLOGY revealed: Site B. LYMPH NODE, LEFT AXILLARY; ULTRASOUND-GUIDED BIOPSY: - CORES OF BENIGN LYMPH NODE, NEGATIVE FOR MALIGNANCY IN THIS SAMPLE. Pathology results are CONCORDANT with imaging findings, per Dr. Lajean Manes. Pathology results and recommendations below were discussed with patient by telephone on 07/06/2020. Patient reported biopsy site within normal limits with slight tenderness at the site.  Post biopsy care instructions were reviewed, questions were answered and my direct phone number was provided to patient. Patient was instructed to call [facility] if any concerns or questions arise related to the biopsy. Recommend surgical consultation for Site A only (LEFT breast mass). Notified Al Pimple RN and Tanya Nones RN at [facility] on 07/06/2020, that patient requests her provider (Dr. Frazier Richards) make her surgical consultation. Pathology results reported by Electa Sniff RN on 07/09/2020. Electronically Signed   By: Lajean Manes M.D.   On: 07/09/2020 12:48   Result Date: 07/09/2020 CLINICAL DATA:  Patient presents for ultrasound-guided core needle biopsy of a retroareolar left breast mass and a single borderline abnormal left axillary lymph node. EXAM: ULTRASOUND GUIDED LEFT BREAST CORE NEEDLE BIOPSY ULTRASOUND GUIDED LEFT AXILLARY LYMPH NODE CORE NEEDLE BIOPSY COMPARISON:  Previous exam(s). PROCEDURE: I met with the patient and we discussed the procedure of ultrasound-guided biopsy, including benefits and alternatives. We discussed the high likelihood of a successful procedure. We discussed the risks of the procedure, including infection, bleeding, tissue injury, clip migration, and inadequate sampling. Informed written consent was given. The usual time-out protocol was performed immediately prior to the procedure. Biopsy #1: 2.5 cm mass at 1 o'clock, 1 cm from the nipple. Lesion quadrant: Upper outer quadrant Using sterile technique and 1% Lidocaine as local anesthetic, under direct ultrasound visualization, a 12 gauge spring-loaded device was used to perform biopsy of retroareolar left breast mass using a medial approach. At the conclusion of the procedure Q shaped tissue marker clip was deployed into the biopsy cavity. Biopsy #2: Left axillary lymph node. Lesion location: Left axilla Using sterile technique and 1% Lidocaine as local anesthetic, under direct ultrasound visualization, a 14 gauge spring-loaded device was used to perform biopsy of the lymph node with the 4 mm mildly thickened cortex using an inferior approach. At the conclusion of the procedure vision tissue marker clip was deployed into the biopsy cavity. Follow up 2 view mammogram was performed and dictated separately. IMPRESSION: Ultrasound  guided biopsy of a retroareolar left breast mass and a single left axillary lymph node. No apparent complications. Electronically Signed: By: Lajean Manes M.D. On: 07/05/2020 09:11   [facility] Mammo  Result Date: 06/29/2020 This examination belongs to an outside facility and is stored here for comparison purposes only.  Contact the originating outside institution for any associated report or interpretation.  MM [facility] Mammo  Result Date: 06/29/2020 This examination belongs to an outside facility and is stored here for comparison purposes only.  Contact the originating outside institution for any associated report or interpretation.  MM [facility] Mammo  Result Date: 06/29/2020 This examination belongs to an outside facility and is stored here for comparison purposes only.  Contact the originating outside institution for any associated report or interpretation.    Assessment and plan- Patient is a 65 y.o. 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  She is here for on treatment assessment prior to cycle 1 of neoadjuvant TCHP chemotherapy  Counts okay to proceed with cycle 1 of neoadjuvant TCHP chemotherapy today with on pro Neulasta support.  Her white cell count is mildly elevated at 15.  No signs and symptoms of infection.  She will still proceed with on pro Neulasta today.  Patient did get her Humira dose 2 days ago when she is  due for her next dose in 2 weeks.  I have again strongly recommended that she should speak to her dermatologist about holding off on getting Humira during treatments.  Patient has baseline immunocompromise state due to being on Humira and she is also starting myelosuppressive chemotherapy today.  She is vaccinated against COVID and has received 1 booster so far.  She would be eligible for evushield.  I have briefly discussed this with her and she  is agreeable to proceeding with it and we will plan to arrange it in the next 1 to 2 weeks  Patient knows to take as needed Tylenol and Claritin for Neulasta associated bone pain  Baseline echocardiogram was normal.  I will see her back in 10 days with labs for possible fluids and she will see covering MD/NP in 3 weeks for cycle 2 of neoadjuvant TCHP chemotherapy.  Her MRI bilateral breast was just approved yesterday and we will proceed with scheduling it at this time   Visit Diagnosis 1. Malignant neoplasm of upper-outer quadrant of left breast in female, estrogen receptor positive ([place])   2. Encounter for antineoplastic chemotherapy   3. Encounter for monoclonal antibody treatment for malignancy      Dr. Randa Evens, MD, MPH [facility] [phone] 07/23/2020 9:34 AM

## 2020-07-23 NOTE — Patient Instructions (Signed)
[facility]  Discharge Instructions: Thank you for choosing [facility] to provide your oncology and hematology care.  If you have a lab appointment with the [facility], please go directly to the [facility] and check in at the registration area.  Wear comfortable clothing and clothing appropriate for easy access to any Portacath or PICC line.   We strive to give you quality time with your provider. You may need to reschedule your appointment if you arrive late (15 or more minutes).  Arriving late affects you and other patients whose appointments are after yours.  Also, if you miss three or more appointments without notifying the office, you may be dismissed from the clinic at the provider's discretion.      For prescription refill requests, have your pharmacy contact our office and allow 72 hours for refills to be completed.    Today you received the following chemotherapy and/or immunotherapy agents Trastuzumab, Perjeta, Taxotere, Carboplatin.       To help prevent nausea and vomiting after your treatment, we encourage you to take your nausea medication as directed.  BELOW ARE SYMPTOMS THAT SHOULD BE REPORTED IMMEDIATELY: . *FEVER GREATER THAN 100.4 F (38 C) OR HIGHER . *CHILLS OR SWEATING . *NAUSEA AND VOMITING THAT IS NOT CONTROLLED WITH YOUR NAUSEA MEDICATION . *UNUSUAL SHORTNESS OF BREATH . *UNUSUAL BRUISING OR BLEEDING . *URINARY PROBLEMS (pain or burning when urinating, or frequent urination) . *BOWEL PROBLEMS (unusual diarrhea, constipation, pain near the anus) . TENDERNESS IN MOUTH AND THROAT WITH OR WITHOUT PRESENCE OF ULCERS (sore throat, sores in mouth, or a toothache) . UNUSUAL RASH, SWELLING OR PAIN  . UNUSUAL VAGINAL DISCHARGE OR ITCHING   Items with * indicate a potential emergency and should be followed up as soon as possible or go to the Emergency Department if any problems should occur.  Please show the  CHEMOTHERAPY ALERT CARD or IMMUNOTHERAPY ALERT CARD at check-in to the Emergency Department and triage nurse.  Should you have questions after your visit or need to cancel or reschedule your appointment, please contact [facility]  [phone] and follow the prompts.  Office hours are 8:00 a.m. to 4:30 p.m. Monday - Friday. Please note that voicemails left after 4:00 p.m. may not be returned until the following business day.  We are closed weekends and major holidays. You have access to a nurse at all times for urgent questions. Please call the main number to the clinic [phone] and follow the prompts.  For any non-urgent questions, you may also contact your provider using MyChart. We now offer e-Visits for anyone 70 and older to request care online for non-urgent symptoms. For details visit [email or web address].   Also download the MyChart app! Go to the app store, search "MyChart", open the app, select [facility], and log in with your MyChart username and password.  Due to Covid, a mask is required upon entering the hospital/clinic. If you do not have a mask, one will be given to you upon arrival. For doctor visits, patients may have 1 support person aged 7 or older with them. For treatment visits, patients cannot have anyone with them due to current Covid guidelines and our immunocompromised population.    Palonosetron Injection What is this medicine? PALONOSETRON (pal oh NOE se tron) is used to prevent nausea and vomiting caused by chemotherapy. It also helps prevent delayed nausea and vomiting that may occur a few days after your treatment. This  medicine may be used for other purposes; ask your health care provider or pharmacist if you have questions. COMMON BRAND NAME(S): Aloxi What should I tell my health care provider before I take this medicine? They need to know if you have any of these conditions:  an unusual or allergic reaction to  palonosetron, dolasetron, granisetron, ondansetron, other medicines, foods, dyes, or preservatives  pregnant or trying to get pregnant  breast-feeding How should I use this medicine? This medicine is for infusion into a vein. It is given by a health care professional in a hospital or clinic setting. Talk to your pediatrician regarding the use of this medicine in children. While this drug may be prescribed for children as young as 1 month for selected conditions, precautions do apply. Overdosage: If you think you have taken too much of this medicine contact a poison control center or emergency room at once. NOTE: This medicine is only for you. Do not share this medicine with others. What if I miss a dose? This does not apply. What may interact with this medicine?  certain medicines for depression, anxiety, or psychotic disturbances  fentanyl  linezolid  MAOIs like Carbex, Eldepryl, Marplan, Nardil, and Parnate  methylene blue (injected into a vein)  tramadol This list may not describe all possible interactions. Give your health care provider a list of all the medicines, herbs, non-prescription drugs, or dietary supplements you use. Also tell them if you smoke, drink alcohol, or use illegal drugs. Some items may interact with your medicine. What should I watch for while using this medicine? Your condition will be monitored carefully while you are receiving this medicine. What side effects may I notice from receiving this medicine? Side effects that you should report to your doctor or health care professional as soon as possible:  allergic reactions like skin rash, itching or hives, swelling of the face, lips, or tongue  breathing problems  confusion  dizziness  fast, irregular heartbeat  fever and chills  loss of balance or coordination  seizures  sweating  swelling of the hands and feet  tremors  unusually weak or tired Side effects that usually do not require  medical attention (report to your doctor or health care professional if they continue or are bothersome):  constipation or diarrhea  headache This list may not describe all possible side effects. Call your doctor for medical advice about side effects. You may report side effects to FDA at 1-800-FDA-1088. Where should I keep my medicine? This drug is given in a hospital or clinic and will not be stored at home. NOTE: This sheet is a summary. It may not cover all possible information. If you have questions about this medicine, talk to your doctor, pharmacist, or health care provider.  2021 Elsevier/Gold Standard (2013-01-03 10:38:36)   Fosaprepitant injection What is this medicine? FOSAPREPITANT (fos ap RE pi tant) is used together with other medicines to prevent nausea and vomiting caused by cancer treatment (chemotherapy). This medicine may be used for other purposes; ask your health care provider or pharmacist if you have questions. COMMON BRAND NAME(S): Emend What should I tell my health care provider before I take this medicine? They need to know if you have any of these conditions:  liver disease  an unusual or allergic reaction to fosaprepitant, aprepitant, medicines, foods, dyes, or preservatives  pregnant or trying to get pregnant  breast-feeding How should I use this medicine? This medicine is for injection into a vein. It is given by a  health care professional in a hospital or clinic setting. Talk to your pediatrician regarding the use of this medicine in children. While this drug may be prescribed for children as young as 6 months for selected conditions, precautions do apply. Overdosage: If you think you have taken too much of this medicine contact a poison control center or emergency room at once. NOTE: This medicine is only for you. Do not share this medicine with others. What if I miss a dose? This does not apply. What may interact with this medicine? Do not take  this medicine with any of these medicines:  cisapride  flibanserin  lomitapide  pimozide This medicine may also interact with the following medications:  diltiazem  female hormones, like estrogens or progestins and birth control pills  medicines for fungal infections like ketoconazole and itraconazole  medicines for HIV  medicines for seizures or to control epilepsy like carbamazepine or phenytoin  medicines used for sleep or anxiety disorders like alprazolam, diazepam, or midazolam  nefazodone  paroxetine  ranolazine  rifampin  some chemotherapy medications like etoposide, ifosfamide, vinblastine, vincristine  some antibiotics like clarithromycin, erythromycin, troleandomycin  steroid medicines like dexamethasone or methylprednisolone  tolbutamide  warfarin This list may not describe all possible interactions. Give your health care provider a list of all the medicines, herbs, non-prescription drugs, or dietary supplements you use. Also tell them if you smoke, drink alcohol, or use illegal drugs. Some items may interact with your medicine. What should I watch for while using this medicine? Do not take this medicine if you already have nausea and vomiting. Ask your health care provider what to do if you already have nausea. Birth control pills and other methods of hormonal contraception (for example, IUD or patch) may not work properly while you are taking this medicine. Use an extra method of birth control during treatment and for 1 month after your last dose of fosaprepitant. This medicine should not be used continuously for a long time. Visit your doctor or health care professional for regular check-ups. This medicine may change your liver function blood test results. What side effects may I notice from receiving this medicine? Side effects that you should report to your doctor or health care professional as soon as possible:  allergic reactions like skin rash,  itching or hives, swelling of the face, lips, or tongue  breathing problems  changes in heart rhythm  high or low blood pressure  pain, redness, or irritation at site where injected  rectal bleeding  serious dizziness or disorientation, confusion  sharp or severe stomach pain  sharp pain in your leg Side effects that usually do not require medical attention (report to your doctor or health care professional if they continue or are bothersome):  constipation or diarrhea  hair loss  headache  hiccups  loss of appetite  nausea  upset stomach  tiredness This list may not describe all possible side effects. Call your doctor for medical advice about side effects. You may report side effects to FDA at 1-800-FDA-1088. Where should I keep my medicine? This drug is given in a hospital or clinic and will not be stored at home. NOTE: This sheet is a summary. It may not cover all possible information. If you have questions about this medicine, talk to your doctor, pharmacist, or health care provider.  2021 Elsevier/Gold Standard (2016-06-15 12:55:48)   Trastuzumab injection for infusion What is this medicine? TRASTUZUMAB (tras TOO zoo mab) is a monoclonal antibody. It is used to  treat breast cancer and stomach cancer. This medicine may be used for other purposes; ask your health care provider or pharmacist if you have questions. COMMON BRAND NAME(S): Herceptin, Galvin Proffer, Trazimera What should I tell my health care provider before I take this medicine? They need to know if you have any of these conditions:  heart disease  heart failure  lung or breathing disease, like asthma  an unusual or allergic reaction to trastuzumab, benzyl alcohol, or other medications, foods, dyes, or preservatives  pregnant or trying to get pregnant  breast-feeding How should I use this medicine? This drug is given as an infusion into a vein. It is administered in a  hospital or clinic by a specially trained health care professional. Talk to your pediatrician regarding the use of this medicine in children. This medicine is not approved for use in children. Overdosage: If you think you have taken too much of this medicine contact a poison control center or emergency room at once. NOTE: This medicine is only for you. Do not share this medicine with others. What if I miss a dose? It is important not to miss a dose. Call your doctor or health care professional if you are unable to keep an appointment. What may interact with this medicine? This medicine may interact with the following medications:  certain types of chemotherapy, such as daunorubicin, doxorubicin, epirubicin, and idarubicin This list may not describe all possible interactions. Give your health care provider a list of all the medicines, herbs, non-prescription drugs, or dietary supplements you use. Also tell them if you smoke, drink alcohol, or use illegal drugs. Some items may interact with your medicine. What should I watch for while using this medicine? Visit your doctor for checks on your progress. Report any side effects. Continue your course of treatment even though you feel ill unless your doctor tells you to stop. Call your doctor or health care professional for advice if you get a fever, chills or sore throat, or other symptoms of a cold or flu. Do not treat yourself. Try to avoid being around people who are sick. You may experience fever, chills and shaking during your first infusion. These effects are usually mild and can be treated with other medicines. Report any side effects during the infusion to your health care professional. Fever and chills usually do not happen with later infusions. Do not become pregnant while taking this medicine or for 7 months after stopping it. Women should inform their doctor if they wish to become pregnant or think they might be pregnant. Women of child-bearing  potential will need to have a negative pregnancy test before starting this medicine. There is a potential for serious side effects to an unborn child. Talk to your health care professional or pharmacist for more information. Do not breast-feed an infant while taking this medicine or for 7 months after stopping it. Women must use effective birth control with this medicine. What side effects may I notice from receiving this medicine? Side effects that you should report to your doctor or health care professional as soon as possible:  allergic reactions like skin rash, itching or hives, swelling of the face, lips, or tongue  chest pain or palpitations  cough  dizziness  feeling faint or lightheaded, falls  fever  general ill feeling or flu-like symptoms  signs of worsening heart failure like breathing problems; swelling in your legs and feet  unusually weak or tired Side effects that usually do not require  medical attention (report to your doctor or health care professional if they continue or are bothersome):  bone pain  changes in taste  diarrhea  joint pain  nausea/vomiting  weight loss This list may not describe all possible side effects. Call your doctor for medical advice about side effects. You may report side effects to FDA at 1-800-FDA-1088. Where should I keep my medicine? This drug is given in a hospital or clinic and will not be stored at home. NOTE: This sheet is a summary. It may not cover all possible information. If you have questions about this medicine, talk to your doctor, pharmacist, or health care provider.  2021 Elsevier/Gold Standard (2016-02-22 14:37:52)  Pertuzumab injection What is this medicine? PERTUZUMAB (per TOOZ ue mab) is a monoclonal antibody. It is used to treat breast cancer. This medicine may be used for other purposes; ask your health care provider or pharmacist if you have questions. COMMON BRAND NAME(S): PERJETA What should I tell my  health care provider before I take this medicine? They need to know if you have any of these conditions:  heart disease  heart failure  high blood pressure  history of irregular heart beat  recent or ongoing radiation therapy  an unusual or allergic reaction to pertuzumab, other medicines, foods, dyes, or preservatives  pregnant or trying to get pregnant  breast-feeding How should I use this medicine? This medicine is for infusion into a vein. It is given by a health care professional in a hospital or clinic setting. Talk to your pediatrician regarding the use of this medicine in children. Special care may be needed. Overdosage: If you think you have taken too much of this medicine contact a poison control center or emergency room at once. NOTE: This medicine is only for you. Do not share this medicine with others. What if I miss a dose? It is important not to miss your dose. Call your doctor or health care professional if you are unable to keep an appointment. What may interact with this medicine? Interactions are not expected. Give your health care provider a list of all the medicines, herbs, non-prescription drugs, or dietary supplements you use. Also tell them if you smoke, drink alcohol, or use illegal drugs. Some items may interact with your medicine. This list may not describe all possible interactions. Give your health care provider a list of all the medicines, herbs, non-prescription drugs, or dietary supplements you use. Also tell them if you smoke, drink alcohol, or use illegal drugs. Some items may interact with your medicine. What should I watch for while using this medicine? Your condition will be monitored carefully while you are receiving this medicine. Report any side effects. Continue your course of treatment even though you feel ill unless your doctor tells you to stop. Do not become pregnant while taking this medicine or for 7 months after stopping it. Women should  inform their doctor if they wish to become pregnant or think they might be pregnant. Women of child-bearing potential will need to have a negative pregnancy test before starting this medicine. There is a potential for serious side effects to an unborn child. Talk to your health care professional or pharmacist for more information. Do not breast-feed an infant while taking this medicine or for 7 months after stopping it. Women must use effective birth control with this medicine. Call your doctor or health care professional for advice if you get a fever, chills or sore throat, or other symptoms of a cold  or flu. Do not treat yourself. Try to avoid being around people who are sick. You may experience fever, chills, and headache during the infusion. Report any side effects during the infusion to your health care professional. What side effects may I notice from receiving this medicine? Side effects that you should report to your doctor or health care professional as soon as possible:  breathing problems  chest pain or palpitations  dizziness  feeling faint or lightheaded  fever or chills  skin rash, itching or hives  sore throat  swelling of the face, lips, or tongue  swelling of the legs or ankles  unusually weak or tired Side effects that usually do not require medical attention (report to your doctor or health care professional if they continue or are bothersome):  diarrhea  hair loss  nausea, vomiting  tiredness This list may not describe all possible side effects. Call your doctor for medical advice about side effects. You may report side effects to FDA at 1-800-FDA-1088. Where should I keep my medicine? This drug is given in a hospital or clinic and will not be stored at home. NOTE: This sheet is a summary. It may not cover all possible information. If you have questions about this medicine, talk to your doctor, pharmacist, or health care provider.  2021 Elsevier/Gold  Standard (2015-04-01 12:08:50)  Docetaxel injection What is this medicine? DOCETAXEL (doe se TAX el) is a chemotherapy drug. It targets fast dividing cells, like cancer cells, and causes these cells to die. This medicine is used to treat many types of cancers like breast cancer, certain stomach cancers, head and neck cancer, lung cancer, and prostate cancer. This medicine may be used for other purposes; ask your health care provider or pharmacist if you have questions. COMMON BRAND NAME(S): Docefrez, Taxotere What should I tell my health care provider before I take this medicine? They need to know if you have any of these conditions:  infection (especially a virus infection such as chickenpox, cold sores, or herpes)  liver disease  low blood counts, like low white cell, platelet, or red cell counts  an unusual or allergic reaction to docetaxel, polysorbate 80, other chemotherapy agents, other medicines, foods, dyes, or preservatives  pregnant or trying to get pregnant  breast-feeding How should I use this medicine? This drug is given as an infusion into a vein. It is administered in a hospital or clinic by a specially trained health care professional. Talk to your pediatrician regarding the use of this medicine in children. Special care may be needed. Overdosage: If you think you have taken too much of this medicine contact a poison control center or emergency room at once. NOTE: This medicine is only for you. Do not share this medicine with others. What if I miss a dose? It is important not to miss your dose. Call your doctor or health care professional if you are unable to keep an appointment. What may interact with this medicine? Do not take this medicine with any of the following medications:  live virus vaccines This medicine may also interact with the following medications:  aprepitant  certain antibiotics like erythromycin or clarithromycin  certain antivirals for HIV or  hepatitis  certain medicines for fungal infections like fluconazole, itraconazole, ketoconazole, posaconazole, or voriconazole  cimetidine  ciprofloxacin  conivaptan  cyclosporine  dronedarone  fluvoxamine  grapefruit juice  imatinib  verapamil This list may not describe all possible interactions. Give your health care provider a list of all the  medicines, herbs, non-prescription drugs, or dietary supplements you use. Also tell them if you smoke, drink alcohol, or use illegal drugs. Some items may interact with your medicine. What should I watch for while using this medicine? Your condition will be monitored carefully while you are receiving this medicine. You will need important blood work done while you are taking this medicine. Call your doctor or health care professional for advice if you get a fever, chills or sore throat, or other symptoms of a cold or flu. Do not treat yourself. This drug decreases your body's ability to fight infections. Try to avoid being around people who are sick. Some products may contain alcohol. Ask your health care professional if this medicine contains alcohol. Be sure to tell all health care professionals you are taking this medicine. Certain medicines, like metronidazole and disulfiram, can cause an unpleasant reaction when taken with alcohol. The reaction includes flushing, headache, nausea, vomiting, sweating, and increased thirst. The reaction can last from 30 minutes to several hours. You may get drowsy or dizzy. Do not drive, use machinery, or do anything that needs mental alertness until you know how this medicine affects you. Do not stand or sit up quickly, especially if you are an older patient. This reduces the risk of dizzy or fainting spells. Alcohol may interfere with the effect of this medicine. Talk to your health care professional about your risk of cancer. You may be more at risk for certain types of cancer if you take this medicine. Do  not become pregnant while taking this medicine or for 6 months after stopping it. Women should inform their doctor if they wish to become pregnant or think they might be pregnant. There is a potential for serious side effects to an unborn child. Talk to your health care professional or pharmacist for more information. Do not breast-feed an infant while taking this medicine or for 1 week after stopping it. Males who get this medicine must use a condom during sex with females who can get pregnant. If you get a woman pregnant, the baby could have birth defects. The baby could die before they are born. You will need to continue wearing a condom for 3 months after stopping the medicine. Tell your health care provider right away if your partner becomes pregnant while you are taking this medicine. This may interfere with the ability to father a child. You should talk to your doctor or health care professional if you are concerned about your fertility. What side effects may I notice from receiving this medicine? Side effects that you should report to your doctor or health care professional as soon as possible:  allergic reactions like skin rash, itching or hives, swelling of the face, lips, or tongue  blurred vision  breathing problems  changes in vision  low blood counts - This drug may decrease the number of white blood cells, red blood cells and platelets. You may be at increased risk for infections and bleeding.  nausea and vomiting  pain, redness or irritation at site where injected  pain, tingling, numbness in the hands or feet  redness, blistering, peeling, or loosening of the skin, including inside the mouth  signs of decreased platelets or bleeding - bruising, pinpoint red spots on the skin, black, tarry stools, nosebleeds  signs of decreased red blood cells - unusually weak or tired, fainting spells, lightheadedness  signs of infection - fever or chills, cough, sore throat, pain or  difficulty passing urine  swelling of  the ankle, feet, hands Side effects that usually do not require medical attention (report to your doctor or health care professional if they continue or are bothersome):  constipation  diarrhea  fingernail or toenail changes  hair loss  loss of appetite  mouth sores  muscle pain This list may not describe all possible side effects. Call your doctor for medical advice about side effects. You may report side effects to FDA at 1-800-FDA-1088. Where should I keep my medicine? This drug is given in a hospital or clinic and will not be stored at home. NOTE: This sheet is a summary. It may not cover all possible information. If you have questions about this medicine, talk to your doctor, pharmacist, or health care provider.  2021 Elsevier/Gold Standard (2019-01-27 19:50:31)   Carboplatin injection What is this medicine? CARBOPLATIN (KAR boe pla tin) is a chemotherapy drug. It targets fast dividing cells, like cancer cells, and causes these cells to die. This medicine is used to treat ovarian cancer and many other cancers. This medicine may be used for other purposes; ask your health care provider or pharmacist if you have questions. COMMON BRAND NAME(S): Paraplatin What should I tell my health care provider before I take this medicine? They need to know if you have any of these conditions:  blood disorders  hearing problems  kidney disease  recent or ongoing radiation therapy  an unusual or allergic reaction to carboplatin, cisplatin, other chemotherapy, other medicines, foods, dyes, or preservatives  pregnant or trying to get pregnant  breast-feeding How should I use this medicine? This drug is usually given as an infusion into a vein. It is administered in a hospital or clinic by a specially trained health care professional. Talk to your pediatrician regarding the use of this medicine in children. Special care may be  needed. Overdosage: If you think you have taken too much of this medicine contact a poison control center or emergency room at once. NOTE: This medicine is only for you. Do not share this medicine with others. What if I miss a dose? It is important not to miss a dose. Call your doctor or health care professional if you are unable to keep an appointment. What may interact with this medicine?  medicines for seizures  medicines to increase blood counts like filgrastim, pegfilgrastim, sargramostim  some antibiotics like amikacin, gentamicin, neomycin, streptomycin, tobramycin  vaccines Talk to your doctor or health care professional before taking any of these medicines:  acetaminophen  aspirin  ibuprofen  ketoprofen  naproxen This list may not describe all possible interactions. Give your health care provider a list of all the medicines, herbs, non-prescription drugs, or dietary supplements you use. Also tell them if you smoke, drink alcohol, or use illegal drugs. Some items may interact with your medicine. What should I watch for while using this medicine? Your condition will be monitored carefully while you are receiving this medicine. You will need important blood work done while you are taking this medicine. This drug may make you feel generally unwell. This is not uncommon, as chemotherapy can affect healthy cells as well as cancer cells. Report any side effects. Continue your course of treatment even though you feel ill unless your doctor tells you to stop. In some cases, you may be given additional medicines to help with side effects. Follow all directions for their use. Call your doctor or health care professional for advice if you get a fever, chills or sore throat, or other symptoms of  a cold or flu. Do not treat yourself. This drug decreases your body's ability to fight infections. Try to avoid being around people who are sick. This medicine may increase your risk to bruise or  bleed. Call your doctor or health care professional if you notice any unusual bleeding. Be careful brushing and flossing your teeth or using a toothpick because you may get an infection or bleed more easily. If you have any dental work done, tell your dentist you are receiving this medicine. Avoid taking products that contain aspirin, acetaminophen, ibuprofen, naproxen, or ketoprofen unless instructed by your doctor. These medicines may hide a fever. Do not become pregnant while taking this medicine. Women should inform their doctor if they wish to become pregnant or think they might be pregnant. There is a potential for serious side effects to an unborn child. Talk to your health care professional or pharmacist for more information. Do not breast-feed an infant while taking this medicine. What side effects may I notice from receiving this medicine? Side effects that you should report to your doctor or health care professional as soon as possible:  allergic reactions like skin rash, itching or hives, swelling of the face, lips, or tongue  signs of infection - fever or chills, cough, sore throat, pain or difficulty passing urine  signs of decreased platelets or bleeding - bruising, pinpoint red spots on the skin, black, tarry stools, nosebleeds  signs of decreased red blood cells - unusually weak or tired, fainting spells, lightheadedness  breathing problems  changes in hearing  changes in vision  chest pain  high blood pressure  low blood counts - This drug may decrease the number of white blood cells, red blood cells and platelets. You may be at increased risk for infections and bleeding.  nausea and vomiting  pain, swelling, redness or irritation at the injection site  pain, tingling, numbness in the hands or feet  problems with balance, talking, walking  trouble passing urine or change in the amount of urine Side effects that usually do not require medical attention (report to  your doctor or health care professional if they continue or are bothersome):  hair loss  loss of appetite  metallic taste in the mouth or changes in taste This list may not describe all possible side effects. Call your doctor for medical advice about side effects. You may report side effects to FDA at 1-800-FDA-1088. Where should I keep my medicine? This drug is given in a hospital or clinic and will not be stored at home. NOTE: This sheet is a summary. It may not cover all possible information. If you have questions about this medicine, talk to your doctor, pharmacist, or health care provider.  2021 Elsevier/Gold Standard (2007-06-04 14:38:05)

## 2020-07-23 NOTE — Progress Notes (Signed)
1526: Pt states she experienced a hot flash "a few minutes ago" but it has since resolved. Taxotere paused. Dr. Janese Banks and Faythe Casa NP aware.   1531: Pt continues to deny symptoms and states that it felt like "my normal hot flashes". VS stable. Per Dr. Janese Banks okay to restart and proceed with Taxotere.   1623: Pt tolerated treatment well. No s/s of distress or reaction noted. Pt and VS stable at discharge.

## 2020-07-24 ENCOUNTER — Encounter: Payer: Self-pay | Admitting: Oncology

## 2020-07-24 LAB — HEPATITIS B SURFACE ANTIBODY, QUANTITATIVE: Hep B S AB Quant (Post): <3.1 m[IU]/mL — ABNORMAL LOW (ref >9.9)

## 2020-07-26 ENCOUNTER — Encounter: Payer: Self-pay | Admitting: Oncology

## 2020-07-26 ENCOUNTER — Telehealth: Payer: Self-pay

## 2020-07-26 ENCOUNTER — Other Ambulatory Visit: Payer: Self-pay | Admitting: Adult Health

## 2020-07-26 DIAGNOSIS — C50412 Malignant neoplasm of upper-outer quadrant of left female breast: Secondary | ICD-10-CM

## 2020-07-26 NOTE — Progress Notes (Signed)
I connected by phone with Wendy Caldwell on 07/26/2020, 2:06 PM to discuss the potential use of a new treatment, tixagevimab/cilgavimab, for pre-exposure prophylaxis for prevention of coronavirus disease 2019 (COVID-19) caused by the SARS-CoV-2 virus.  This patient is a 65 y.o. female that meets the FDA criteria for Emergency Use Authorization of tixagevimab/cilgavimab for pre-exposure prophylaxis of COVID-19 disease. Pt meets following criteria:  Age >12 yr and weight > 40kg  Not currently infected with SARS-CoV-2 and has no known recent exposure to an individual infected with SARS-CoV-2 AND o Who has moderate to severe immune compromise due to a medical condition or receipt of immunosuppressive medications or treatments and may not mount an adequate immune response to COVID-19 vaccination or  o Vaccination with any available COVID-19 vaccine, according to the approved or authorized schedule, is not recommended due to a history of severe adverse reaction (e.g., severe allergic reaction) to a COVID-19 vaccine(s) and/or COVID-19 vaccine component(s).  o Patient meets the following definition of mod-severe immune compromised status: 7. Solid tumor malignancies on immunomodulatory chemotherapy or advanced AIDS   I have spoken and communicated the following to the patient or parent/caregiver regarding COVID monoclonal antibody treatment:  1. FDA has authorized the emergency use of tixagevimab/cilgavimab for the pre-exposure prophylaxis of COVID-19 in patients with moderate-severe immunocompromised status, who meet above EUA criteria.  2. The significant known and potential risks and benefits of COVID monoclonal antibody, and the extent to which such potential risks and benefits are unknown.  3. Information on available alternative treatments and the risks and benefits of those alternatives, including clinical trials.  4. The patient or parent/caregiver has the option to accept or refuse COVID  monoclonal antibody treatment.  After reviewing this information with the patient, agree to receive tixagevimab/cilgavimab. High priority schedule message sent.  Scot Dock, NP, 07/26/2020, 2:06 PM

## 2020-07-26 NOTE — Anesthesia Postprocedure Evaluation (Signed)
Anesthesia Post Note  Patient: Wendy Caldwell  Procedure(s) Performed: INSERTION PORT-A-CATH (Right Chest)  Anesthesia Type: General Level of consciousness: awake and alert and oriented Pain management: pain level controlled Vital Signs Assessment: post-procedure vital signs reviewed and stable Respiratory status: spontaneous breathing Cardiovascular status: blood pressure returned to baseline Anesthetic complications: no   No complications documented.   Last Vitals:  Vitals:   07/21/20 1730 07/21/20 1745  BP: 116/71 122/79  Pulse: 81 80  Resp: 13 20  Temp:  36.8 C  SpO2: 95% 95%    Last Pain:  Vitals:   07/22/20 0843  TempSrc:   PainSc: 3                  Abrish Erny

## 2020-07-26 NOTE — Telephone Encounter (Signed)
Telephone call to patient for follow up after receiving first infusion.   Patient states infusion went great.  States eating good and drinking plenty of fluids.   Denies any nausea or vomiting.  Encouraged patient to call for any concerns or questions. 

## 2020-07-27 ENCOUNTER — Encounter: Payer: Self-pay | Admitting: *Deleted

## 2020-07-28 ENCOUNTER — Encounter: Payer: Self-pay | Admitting: Oncology

## 2020-07-28 ENCOUNTER — Other Ambulatory Visit: Payer: Self-pay | Admitting: Oncology

## 2020-07-28 ENCOUNTER — Encounter: Payer: Self-pay | Admitting: *Deleted

## 2020-07-28 ENCOUNTER — Inpatient Hospital Stay (HOSPITAL_BASED_OUTPATIENT_CLINIC_OR_DEPARTMENT_OTHER): Payer: BC Managed Care – PPO | Admitting: Hospice and Palliative Medicine

## 2020-07-28 ENCOUNTER — Telehealth: Payer: Self-pay

## 2020-07-28 ENCOUNTER — Other Ambulatory Visit: Payer: Self-pay

## 2020-07-28 ENCOUNTER — Inpatient Hospital Stay: Payer: BC Managed Care – PPO

## 2020-07-28 DIAGNOSIS — Z9225 Personal history of immunosupression therapy: Secondary | ICD-10-CM | POA: Diagnosis not present

## 2020-07-28 DIAGNOSIS — C50412 Malignant neoplasm of upper-outer quadrant of left female breast: Secondary | ICD-10-CM

## 2020-07-28 DIAGNOSIS — Z298 Encounter for other specified prophylactic measures: Secondary | ICD-10-CM | POA: Diagnosis not present

## 2020-07-28 DIAGNOSIS — Z5111 Encounter for antineoplastic chemotherapy: Secondary | ICD-10-CM | POA: Diagnosis not present

## 2020-07-28 DIAGNOSIS — Z170 Estrogen receptor positive status [ER+]: Secondary | ICD-10-CM | POA: Diagnosis not present

## 2020-07-28 DIAGNOSIS — Z5189 Encounter for other specified aftercare: Secondary | ICD-10-CM | POA: Diagnosis not present

## 2020-07-28 DIAGNOSIS — Z87891 Personal history of nicotine dependence: Secondary | ICD-10-CM | POA: Diagnosis not present

## 2020-07-28 DIAGNOSIS — Z5112 Encounter for antineoplastic immunotherapy: Secondary | ICD-10-CM | POA: Diagnosis not present

## 2020-07-28 MED ORDER — CILGAVIMAB (PART OF EVUSHELD) INJECTION
300.0000 mg | Freq: Once | INTRAMUSCULAR | Status: AC
  Administered 2020-07-28: 300 mg via INTRAMUSCULAR
  Filled 2020-07-28: qty 3

## 2020-07-28 MED ORDER — TRIAMCINOLONE ACETONIDE 0.5 % EX OINT: 1.0000 "application " | TOPICAL_OINTMENT | Freq: Two times a day (BID) | CUTANEOUS | 0 refills | Status: DC

## 2020-07-28 MED ORDER — TIXAGEVIMAB (PART OF EVUSHELD) INJECTION
300.0000 mg | Freq: Once | INTRAMUSCULAR | Status: AC
  Administered 2020-07-28: 300 mg via INTRAMUSCULAR
  Filled 2020-07-28: qty 3

## 2020-07-28 NOTE — Progress Notes (Signed)
1410: pt reports mouth sores (that she has been in contact with MD team about) Per MD team magic mouth wash has been called into pharmacy, pt made aware. Pt also reports a "rash" on bilateral underarms. Dr. Janese Banks aware. Per Dr. Janese Banks okay to proceed with [facility] as scheduled. Faythe Casa NP to see pt in infusion.   1500: Faythe Casa NP at chairside to assess underarms, and discuss plan.   1520: Pt tolerated Evushled injections well. Injection sites WNL, no s/s of distress or reaction noted. Pt and VS stable at discharge.

## 2020-07-28 NOTE — Progress Notes (Signed)
Re: Rash under arms  Unclear etiology.  Symptoms have been present for 1 to 2 days.  Describes symptoms as itchy and uncomfortable.  No pain.  Worse due to friction.  Denies any new lotions, body wash or deodorant.  Use baby powder this morning.  Can try triamcinolone cream 0.5% 2-3 times daily.  Prescription sent to [facility].  Faythe Casa, NP 07/28/2020 4:13 PM

## 2020-07-28 NOTE — Progress Notes (Signed)
Multidisciplinary Oncology Council Documentation  Kairy Folsom was presented by our [facility] on 07/28/2020, which included representatives from:  . Palliative Care . Dietitian  . Physical/Occupational Therapist . Nurse Navigator . Genetics . Speech Therapist . Social work . Survivorship RN . Hotel manager . Research RN . [place] Representative  Danijah currently presents with history of breast cancer  We reviewed previous medical and familial history, history of present illness, and recent lab results along with all available histopathologic and imaging studies. [facility] considered available treatment options and made the following recommendations/referrals:  genetics  The MOC is a meeting of clinicians from various specialty areas who evaluate and discuss patients for whom a multidisciplinary approach is being considered. Final determinations in the plan of care are those of the provider(s).   Today's extended care, comprehensive team conference, Delbert was not present for the discussion and was not examined.

## 2020-07-28 NOTE — Telephone Encounter (Signed)
Magic mouth wash  Compound Faxed to [facility], per Dr.Rao  [facility]

## 2020-08-03 ENCOUNTER — Inpatient Hospital Stay (HOSPITAL_BASED_OUTPATIENT_CLINIC_OR_DEPARTMENT_OTHER): Payer: BC Managed Care – PPO | Admitting: Oncology

## 2020-08-03 ENCOUNTER — Inpatient Hospital Stay: Payer: BC Managed Care – PPO

## 2020-08-03 VITALS — BP 120/77 | HR 94 | Temp 99.6°F | Resp 16

## 2020-08-03 DIAGNOSIS — Z170 Estrogen receptor positive status [ER+]: Secondary | ICD-10-CM

## 2020-08-03 DIAGNOSIS — Z5189 Encounter for other specified aftercare: Secondary | ICD-10-CM | POA: Diagnosis not present

## 2020-08-03 DIAGNOSIS — Z9225 Personal history of immunosupression therapy: Secondary | ICD-10-CM | POA: Diagnosis not present

## 2020-08-03 DIAGNOSIS — Z5111 Encounter for antineoplastic chemotherapy: Secondary | ICD-10-CM | POA: Diagnosis not present

## 2020-08-03 DIAGNOSIS — R945 Abnormal results of liver function studies: Secondary | ICD-10-CM

## 2020-08-03 DIAGNOSIS — Z95828 Presence of other vascular implants and grafts: Secondary | ICD-10-CM

## 2020-08-03 DIAGNOSIS — Z87891 Personal history of nicotine dependence: Secondary | ICD-10-CM | POA: Diagnosis not present

## 2020-08-03 DIAGNOSIS — C50412 Malignant neoplasm of upper-outer quadrant of left female breast: Secondary | ICD-10-CM | POA: Diagnosis not present

## 2020-08-03 DIAGNOSIS — R7989 Other specified abnormal findings of blood chemistry: Secondary | ICD-10-CM

## 2020-08-03 DIAGNOSIS — Z298 Encounter for other specified prophylactic measures: Secondary | ICD-10-CM | POA: Diagnosis not present

## 2020-08-03 DIAGNOSIS — Z5112 Encounter for antineoplastic immunotherapy: Secondary | ICD-10-CM | POA: Diagnosis not present

## 2020-08-03 LAB — CBC WITH DIFFERENTIAL/PLATELET
Abs Immature Granulocytes: 1.2 10*3/uL — ABNORMAL HIGH (ref 0.00–0.07)
Band Neutrophils: 3 %
Basophils Absolute: 0 10*3/uL (ref 0.0–0.1)
Basophils Relative: 0 %
Eosinophils Absolute: 0.2 10*3/uL (ref 0.0–0.5)
Eosinophils Relative: 1 %
HCT: 38.7 % (ref 36.0–46.0)
Hemoglobin: 12.5 g/dL (ref 12.0–15.0)
Lymphocytes Relative: 21 %
Lymphs Abs: 5 10*3/uL — ABNORMAL HIGH (ref 0.7–4.0)
MCH: 19.9 pg — ABNORMAL LOW (ref 26.0–34.0)
MCHC: 32.3 g/dL (ref 30.0–36.0)
MCV: 61.5 fL — ABNORMAL LOW (ref 80.0–100.0)
Metamyelocytes Relative: 2 %
Monocytes Absolute: 1.7 10*3/uL — ABNORMAL HIGH (ref 0.1–1.0)
Monocytes Relative: 7 %
Myelocytes: 3 %
Neutro Abs: 15.7 10*3/uL — ABNORMAL HIGH (ref 1.7–7.7)
Neutrophils Relative %: 63 %
Platelets: 280 10*3/uL (ref 150–400)
RBC: 6.29 MIL/uL — ABNORMAL HIGH (ref 3.87–5.11)
RDW: 16.4 % — ABNORMAL HIGH (ref 11.5–15.5)
Smear Review: ADEQUATE
WBC: 23.8 10*3/uL — ABNORMAL HIGH (ref 4.0–10.5)
nRBC: 0.3 % — ABNORMAL HIGH (ref 0.0–0.2)

## 2020-08-03 LAB — COMPREHENSIVE METABOLIC PANEL
ALT: 53 U/L — ABNORMAL HIGH (ref 0–44)
AST: 29 U/L (ref 15–41)
Albumin: 3.9 g/dL (ref 3.5–5.0)
Alkaline Phosphatase: 148 U/L — ABNORMAL HIGH (ref 38–126)
Anion gap: 11 (ref 5–15)
BUN: 21 mg/dL (ref 8–23)
CO2: 22 mmol/L (ref 22–32)
Calcium: 8.7 mg/dL — ABNORMAL LOW (ref 8.9–10.3)
Chloride: 98 mmol/L (ref 98–111)
Creatinine, Ser: 0.97 mg/dL (ref 0.44–1.00)
GFR, Estimated: >60 mL/min (ref >60)
Glucose, Bld: 98 mg/dL (ref 70–99)
Potassium: 3.7 mmol/L (ref 3.5–5.1)
Sodium: 131 mmol/L — ABNORMAL LOW (ref 135–145)
Total Bilirubin: 0.7 mg/dL (ref 0.3–1.2)
Total Protein: 7.3 g/dL (ref 6.5–8.1)

## 2020-08-03 MED ORDER — HEPARIN SOD (PORK) LOCK FLUSH 100 UNIT/ML IV SOLN
500.0000 [IU] | Freq: Once | INTRAVENOUS | Status: AC
  Administered 2020-08-03: 500 [IU] via INTRAVENOUS
  Filled 2020-08-03: qty 5

## 2020-08-03 NOTE — Progress Notes (Signed)
Hematology/Oncology Consult note [facility]  Telephone:([phone] [phone]  Patient Care Team: Kirk Ruths, MD as PCP - General (Internal Medicine) Rico Junker, RN as Oncology Nurse Navigator   Name of the patient: Wendy Caldwell  [id number]  24-Aug-1955   Date of visit: 08/03/20  Diagnosis- left breast cancer clinical prognostic stage IIA cT2 N0 M0 ER weakly positive, PR negative HER2 positive   Chief complaint/ Reason for visit-toxicity check after cycle 1 of TCHP chemotherapy neoadjuvant  Heme/Onc history: Patient is a 65 year old female with a past medical history significant for psoriasis for which she is on Humira. She self palpated a left breast mass which prompted a bilateral diagnostic mammogram on 06/25/2020. That showed an irregular hypoechoic mass 2.5 x 1.7 x 1.8 cm at 1 o'clock position 1 cm from the nipple. 3 cm from the nipple were benign cysts. 1 mildly enlarged left axillary lymph node with cortical thickening 4 mm. Both the breast mass and the lymph node were biopsied. Left breast biopsy was consistent with invasive mammary carcinoma grade 3 ER weakly +1 to 10%, PR negative and HER2 positive by IHC.   Plan is to proceed with 6 cycles of neoadjuvant TCHP chemotherapy followed by surgery  Interval history-patient has not received any further doses of Humira for her psoriatic arthritis.  Reports dryness in her bilateral hands for which she is using topical creams.  Reports pain and curling of her toes especially at night.  She had a skin rash in her left armpit which has resolved.  Reports dry mouth for which she is using XyliMelts and has been working well for her.  Occasional nosebleeds which are self-limited  ECOG PS- 0 Pain scale- 0   Review of systems- Review of Systems  Constitutional: Positive for malaise/fatigue. Negative for chills, fever and weight loss.  HENT: Positive for nosebleeds. Negative for  congestion and ear discharge.   Eyes: Negative for blurred vision.  Respiratory: Negative for cough, hemoptysis, sputum production, shortness of breath and wheezing.   Cardiovascular: Negative for chest pain, palpitations, orthopnea and claudication.  Gastrointestinal: Negative for abdominal pain, blood in stool, constipation, diarrhea, heartburn, melena, nausea and vomiting.  Genitourinary: Negative for dysuria, flank pain, frequency, hematuria and urgency.  Musculoskeletal: Negative for back pain, joint pain and myalgias.  Skin: Negative for rash.  Neurological: Negative for dizziness, tingling, focal weakness, seizures, weakness and headaches.  Endo/Heme/Allergies: Does not bruise/bleed easily.  Psychiatric/Behavioral: Negative for depression and suicidal ideas. The patient does not have insomnia.        No Known Allergies   Past Medical History:  Diagnosis Date  . Family history of adverse reaction to anesthesia    adopted  . GERD (gastroesophageal reflux disease)      Past Surgical History:  Procedure Laterality Date  . BREAST BIOPSY Left 07/05/2020   [place] Bx, Q clip, path pending  . BREAST BIOPSY Left 07/05/2020   [place] Bx, Vision, path pending   . CARPAL TUNNEL RELEASE Bilateral   . CHOLECYSTECTOMY    . GANGLION CYST EXCISION     wrist  . PORTACATH PLACEMENT Right 07/21/2020   Procedure: INSERTION PORT-A-CATH;  Surgeon: Herbert Pun, MD;  Location: [facility] ORS;  Service: General;  Laterality: Right;  . right ovary removal      Social History   Socioeconomic History  . Marital status: Married    Spouse name: Not on file  . Number of children: Not on file  .  Years of education: Not on file  . Highest education level: Not on file  Occupational History  . Not on file  Tobacco Use  . Smoking status: Former Smoker    Start date: 2012  . Smokeless tobacco: Never Used  Vaping Use  . Vaping Use: Never used  Substance and Sexual Activity  . Alcohol use: Yes     Comment: occassional   . Drug use: Never  . Sexual activity: Not on file  Other Topics Concern  . Not on file  Social History Narrative  . Not on file   Social Determinants of Health   Financial Resource Strain: Not on file  Food Insecurity: Not on file  Transportation Needs: Not on file  Physical Activity: Not on file  Stress: Not on file  Social Connections: Not on file  Intimate Partner Violence: Not on file    Family History  Adopted: Yes  Problem Relation Age of Onset  . Breast cancer Neg Hx      Current Outpatient Medications:  .  Adalimumab (HUMIRA) 40 MG/0.8ML PSKT, Inject 40 mg into the skin every 14 (fourteen) days., Disp: , Rfl:  .  dexamethasone (DECADRON) 4 MG tablet, Take 2 tablets (8 mg total) by mouth 2 (two) times daily. Start the day before Taxotere. Then take daily x 3 days after chemotherapy. (Patient not taking: Reported on 07/23/2020), Disp: 30 tablet, Rfl: 1 .  lidocaine-prilocaine (EMLA) cream, Apply to affected area once (Patient taking differently: Apply 1 application topically daily as needed (prior to port access).), Disp: 30 g, Rfl: 3 .  loratadine (CLARITIN) 10 MG tablet, Take 10 mg by mouth daily., Disp: , Rfl:  .  LORazepam (ATIVAN) 0.5 MG tablet, Take 1 tablet (0.5 mg total) by mouth every 6 (six) hours as needed (Nausea or vomiting). (Patient not taking: Reported on 07/23/2020), Disp: 30 tablet, Rfl: 0 .  omeprazole (PRILOSEC) 40 MG capsule, Take 40 mg by mouth daily., Disp: , Rfl:  .  ondansetron (ZOFRAN) 8 MG tablet, Take 1 tablet (8 mg total) by mouth 2 (two) times daily as needed (Nausea or vomiting). Start on the third day after chemotherapy. (Patient not taking: Reported on 07/23/2020), Disp: 30 tablet, Rfl: 1 .  prochlorperazine (COMPAZINE) 10 MG tablet, Take 1 tablet (10 mg total) by mouth every 6 (six) hours as needed (Nausea or vomiting). (Patient not taking: Reported on 07/23/2020), Disp: 30 tablet, Rfl: 1 .  triamcinolone ointment (KENALOG)  0.5 %, Apply 1 application topically 2 (two) times daily., Disp: 30 g, Rfl: 0 .  vitamin B-12 (CYANOCOBALAMIN) 1000 MCG tablet, Take 1,000 mcg by mouth daily., Disp: , Rfl:  .  vitamin C (ASCORBIC ACID) 500 MG tablet, Take 500 mg by mouth daily., Disp: , Rfl:   Physical exam:  Vitals:   08/03/20 0910 08/03/20 0913  BP: (!) 142/77 120/77  Pulse: 94   Resp: 16   Temp: 99.6 F (37.6 C)   TempSrc: Tympanic   SpO2: 98%    Physical Exam Cardiovascular:     Rate and Rhythm: Normal rate and regular rhythm.  Pulmonary:     Effort: Pulmonary effort is normal.  Skin:    General: Skin is warm and dry.  Neurological:     Mental Status: She is alert and oriented to person, place, and time.      CMP Latest Ref Rng & Units 07/23/2020  Glucose 70 - 99 mg/dL 124(H)  BUN 8 - 23 mg/dL 16  Creatinine 0.44 -  1.00 mg/dL 0.96  Sodium 135 - 145 mmol/L 135  Potassium 3.5 - 5.1 mmol/L 3.5  Chloride 98 - 111 mmol/L 101  CO2 22 - 32 mmol/L 23  Calcium 8.9 - 10.3 mg/dL 9.6  Total Protein 6.5 - 8.1 g/dL 7.7  Total Bilirubin 0.3 - 1.2 mg/dL 0.7  Alkaline Phos 38 - 126 U/L 96  AST 15 - 41 U/L 30  ALT 0 - 44 U/L 25   CBC Latest Ref Rng & Units 07/23/2020  WBC 4.0 - 10.5 K/uL 15.1(H)  Hemoglobin 12.0 - 15.0 g/dL 12.3  Hematocrit 36.0 - 46.0 % 38.4  Platelets 150 - 400 K/uL 343    No images are attached to the encounter.  DG Chest Port 1 View  Result Date: 07/21/2020 CLINICAL DATA:  Port-A-Cath insertion EXAM: PORTABLE CHEST 1 VIEW COMPARISON:  None. FINDINGS: Right-sided central venous port tip over the SVC origin. No right pneumothorax. No consolidation or effusion. Coarse interstitial opacity which could be secondary to bronchitic changes. Normal heart size. IMPRESSION: 1. Right-sided central venous port tip over the SVC origin. No pneumothorax 2. Bronchitic changes Electronically Signed   By: Donavan Foil M.D.   On: 07/21/2020 17:55   DG C-Arm 1-60 Min-No Report  Result Date:  07/21/2020 Fluoroscopy was utilized by the requesting physician.  No radiographic interpretation.   ECHOCARDIOGRAM COMPLETE  Result Date: 07/20/2020    ECHOCARDIOGRAM REPORT   Patient Name:   JOYCELYNN FRITSCHE [facility] Date of Exam: 07/20/2020 Medical Rec #:  [id number]          Height:       63.0 in Accession #:    [id number]         Weight:       181.0 lb Date of Birth:  Jun 02, 1955         BSA:          1.853 m Patient Age:    33 years           BP:           145/70 mmHg Patient Gender: F                  HR:           82 bpm. Exam Location:  [facility] Procedure: 2D Echo, Color Doppler, Cardiac Doppler and Strain Analysis Indications:     Z09 Chemo  History:         Patient has no prior history of Echocardiogram examinations.                  Malignant neoplasm of upper-outer quadrant of left breast in                  female, estrogen receptor positive.  Sonographer:     Charmayne Sheer RDCS (AE) Referring Phys:  [id number] Weston Anna Ashlee Bewley Diagnosing Phys: Kate Sable MD  Sonographer Comments: Suboptimal parasternal window. Global longitudinal strain was attempted. IMPRESSIONS  1. Left ventricular ejection fraction, by estimation, is 60 to 65%. The left ventricle has normal function. The left ventricle has no regional wall motion abnormalities. Left ventricular diastolic parameters were normal. The average left ventricular global longitudinal strain is -25.3 %. The global longitudinal strain is normal.  2. Right ventricular systolic function is normal. The right ventricular size is normal.  3. The mitral valve is normal in structure. Mild mitral valve regurgitation.  4. The aortic valve was not well visualized. Aortic valve regurgitation is not visualized.  5. The inferior vena cava is normal in size with greater than 50% respiratory variability, suggesting right atrial pressure of 3 mmHg. FINDINGS  Left Ventricle: Left ventricular ejection fraction, by estimation, is 60 to 65%. The left ventricle has normal function. The left  ventricle has no regional wall motion abnormalities. The average left ventricular global longitudinal strain is -25.3 %. The global longitudinal strain is normal. The left ventricular internal cavity size was normal in size. There is no left ventricular hypertrophy. Left ventricular diastolic parameters were normal. Right Ventricle: The right ventricular size is normal. No increase in right ventricular wall thickness. Right ventricular systolic function is normal. Left Atrium: Left atrial size was normal in size. Right Atrium: Right atrial size was normal in size. Pericardium: There is no evidence of pericardial effusion. Mitral Valve: The mitral valve is normal in structure. Mild mitral valve regurgitation. MV peak gradient, 2.9 mmHg. The mean mitral valve gradient is 1.0 mmHg. Tricuspid Valve: The tricuspid valve is normal in structure. Tricuspid valve regurgitation is not demonstrated. Aortic Valve: The aortic valve was not well visualized. Aortic valve regurgitation is not visualized. Aortic valve mean gradient measures 4.0 mmHg. Aortic valve peak gradient measures 8.4 mmHg. Aortic valve area, by VTI measures 1.93 cm. Pulmonic Valve: The pulmonic valve was not well visualized. Pulmonic valve regurgitation is not visualized. Aorta: The aortic root is normal in size and structure. Venous: The inferior vena cava is normal in size with greater than 50% respiratory variability, suggesting right atrial pressure of 3 mmHg. IAS/Shunts: No atrial level shunt detected by color flow Doppler.  LEFT VENTRICLE PLAX 2D LVIDd:         3.30 cm  Diastology LVIDs:         2.30 cm  LV e' medial:    8.38 cm/s LV PW:         0.90 cm  LV E/e' medial:  9.2 LV IVS:        0.80 cm  LV e' lateral:   12.20 cm/s LVOT diam:     1.70 cm  LV E/e' lateral: 6.3 LV SV:         55 LV SV Index:   30       2D Longitudinal Strain LVOT Area:     2.27 cm 2D Strain GLS Avg:     -25.3 %  RIGHT VENTRICLE RV Basal diam:  2.30 cm TAPSE (M-mode): 3.2 cm  LEFT ATRIUM           Index       RIGHT ATRIUM           Index LA diam:      3.20 cm 1.73 cm/m  RA Area:     11.20 cm LA Vol (A2C): 38.5 ml 20.77 ml/m RA Volume:   23.20 ml  12.52 ml/m LA Vol (A4C): 20.8 ml 11.22 ml/m  AORTIC VALVE                   PULMONIC VALVE AV Area (Vmax):    1.72 cm    PV Vmax:       1.09 m/s AV Area (Vmean):   1.71 cm    PV Vmean:      76.900 cm/s AV Area (VTI):     1.93 cm    PV VTI:        0.233 m AV Vmax:           145.00 cm/s PV Peak grad:  4.8 mmHg AV  Vmean:          97.800 cm/s PV Mean grad:  3.0 mmHg AV VTI:            0.286 m AV Peak Grad:      8.4 mmHg AV Mean Grad:      4.0 mmHg LVOT Vmax:         110.00 cm/s LVOT Vmean:        73.500 cm/s LVOT VTI:          0.243 m LVOT/AV VTI ratio: 0.85  AORTA Ao Root diam: 2.50 cm MITRAL VALVE MV Area (PHT): 3.54 cm    SHUNTS MV Area VTI:   2.64 cm    Systemic VTI:  0.24 m MV Peak grad:  2.9 mmHg    Systemic Diam: 1.70 cm MV Mean grad:  1.0 mmHg MV Vmax:       0.85 m/s MV Vmean:      54.1 cm/s MV Decel Time: 214 msec MV E velocity: 77.10 cm/s MV A velocity: 63.40 cm/s MV E/A ratio:  1.22 Kate Sable MD Electronically signed by Kate Sable MD Signature Date/Time: 07/20/2020/2:11:37 PM    Final    MM CLIP PLACEMENT LEFT  Result Date: 07/05/2020 CLINICAL DATA:  Evaluate post biopsy marker clip placement following ultrasound-guided core needle biopsy of a retroareolar left breast mass and a left axillary lymph node. EXAM: DIAGNOSTIC LEFT MAMMOGRAM POST ULTRASOUND BIOPSY COMPARISON:  Previous exam(s). FINDINGS: Mammographic images were obtained following ultrasound guided biopsy of a retroareolar left breast mass and a left axillary lymph node. The Q shaped biopsy clip lies within the retroareolar mass. The vision clip, placed within the left axillary lymph node, cannot be visualized mammographically. IMPRESSION: Appropriate positioning of the type shaped biopsy marking clip at the site of biopsy in the Q retroareolar left  breast. Final Assessment: Post Procedure Mammograms for Marker Placement Electronically Signed   By: Lajean Manes M.D.   On: 07/05/2020 09:06   [place] LT BREAST BX W LOC DEV 1ST LESION IMG BX SPEC US GUIDE  Addendum Date: 07/09/2020   ADDENDUM REPORT: 07/09/2020 12:48 ADDENDUM: PATHOLOGY revealed: Site A. LEFT BREAST; ULTRASOUND-GUIDED BIOPSY: - INVASIVE MAMMARY CARCINOMA, NO SPECIAL TYPE. Size of invasive carcinoma: 13 mm in this sample. Grade 3. Ductal carcinoma in situ: Not identified. Lymphovascular invasion: Not identified. Comment: The definitive grade will be assigned on the excisional specimen. Pathology results are CONCORDANT with imaging findings, per Dr. Lajean Manes. PATHOLOGY revealed: Site B. LYMPH NODE, LEFT AXILLARY; ULTRASOUND-GUIDED BIOPSY: - CORES OF BENIGN LYMPH NODE, NEGATIVE FOR MALIGNANCY IN THIS SAMPLE. Pathology results are CONCORDANT with imaging findings, per Dr. Lajean Manes. Pathology results and recommendations below were discussed with patient by telephone on 07/06/2020. Patient reported biopsy site within normal limits with slight tenderness at the site. Post biopsy care instructions were reviewed, questions were answered and my direct phone number was provided to patient. Patient was instructed to call [facility] if any concerns or questions arise related to the biopsy. Recommend surgical consultation for Site A only (LEFT breast mass). Notified Al Pimple RN and Tanya Nones RN at [facility] on 07/06/2020, that patient requests her provider (Dr. Frazier Richards) make her surgical consultation. Pathology results reported by Electa Sniff RN on 07/09/2020. Electronically Signed   By: Lajean Manes M.D.   On: 07/09/2020 12:48   Result Date: 07/09/2020 CLINICAL DATA:  Patient presents for ultrasound-guided core needle biopsy of a retroareolar left breast mass and  a single borderline abnormal left axillary lymph node. EXAM: ULTRASOUND GUIDED LEFT BREAST  CORE NEEDLE BIOPSY ULTRASOUND GUIDED LEFT AXILLARY LYMPH NODE CORE NEEDLE BIOPSY COMPARISON:  Previous exam(s). PROCEDURE: I met with the patient and we discussed the procedure of ultrasound-guided biopsy, including benefits and alternatives. We discussed the high likelihood of a successful procedure. We discussed the risks of the procedure, including infection, bleeding, tissue injury, clip migration, and inadequate sampling. Informed written consent was given. The usual time-out protocol was performed immediately prior to the procedure. Biopsy #1: 2.5 cm mass at 1 o'clock, 1 cm from the nipple. Lesion quadrant: Upper outer quadrant Using sterile technique and 1% Lidocaine as local anesthetic, under direct ultrasound visualization, a 12 gauge spring-loaded device was used to perform biopsy of retroareolar left breast mass using a medial approach. At the conclusion of the procedure Q shaped tissue marker clip was deployed into the biopsy cavity. Biopsy #2: Left axillary lymph node. Lesion location: Left axilla Using sterile technique and 1% Lidocaine as local anesthetic, under direct ultrasound visualization, a 14 gauge spring-loaded device was used to perform biopsy of the lymph node with the 4 mm mildly thickened cortex using an inferior approach. At the conclusion of the procedure vision tissue marker clip was deployed into the biopsy cavity. Follow up 2 view mammogram was performed and dictated separately. IMPRESSION: Ultrasound guided biopsy of a retroareolar left breast mass and a single left axillary lymph node. No apparent complications. Electronically Signed: By: Lajean Manes M.D. On: 07/05/2020 09:11   [place] LT BREAST BX W LOC DEV EA ADD LESION IMG BX SPEC US GUIDE  Addendum Date: 07/09/2020   ADDENDUM REPORT: 07/09/2020 12:48 ADDENDUM: PATHOLOGY revealed: Site A. LEFT BREAST; ULTRASOUND-GUIDED BIOPSY: - INVASIVE MAMMARY CARCINOMA, NO SPECIAL TYPE. Size of invasive carcinoma: 13 mm in this sample. Grade  3. Ductal carcinoma in situ: Not identified. Lymphovascular invasion: Not identified. Comment: The definitive grade will be assigned on the excisional specimen. Pathology results are CONCORDANT with imaging findings, per Dr. Lajean Manes. PATHOLOGY revealed: Site B. LYMPH NODE, LEFT AXILLARY; ULTRASOUND-GUIDED BIOPSY: - CORES OF BENIGN LYMPH NODE, NEGATIVE FOR MALIGNANCY IN THIS SAMPLE. Pathology results are CONCORDANT with imaging findings, per Dr. Lajean Manes. Pathology results and recommendations below were discussed with patient by telephone on 07/06/2020. Patient reported biopsy site within normal limits with slight tenderness at the site. Post biopsy care instructions were reviewed, questions were answered and my direct phone number was provided to patient. Patient was instructed to call [facility] if any concerns or questions arise related to the biopsy. Recommend surgical consultation for Site A only (LEFT breast mass). Notified Al Pimple RN and Tanya Nones RN at [facility] on 07/06/2020, that patient requests her provider (Dr. Frazier Richards) make her surgical consultation. Pathology results reported by Electa Sniff RN on 07/09/2020. Electronically Signed   By: Lajean Manes M.D.   On: 07/09/2020 12:48   Result Date: 07/09/2020 CLINICAL DATA:  Patient presents for ultrasound-guided core needle biopsy of a retroareolar left breast mass and a single borderline abnormal left axillary lymph node. EXAM: ULTRASOUND GUIDED LEFT BREAST CORE NEEDLE BIOPSY ULTRASOUND GUIDED LEFT AXILLARY LYMPH NODE CORE NEEDLE BIOPSY COMPARISON:  Previous exam(s). PROCEDURE: I met with the patient and we discussed the procedure of ultrasound-guided biopsy, including benefits and alternatives. We discussed the high likelihood of a successful procedure. We discussed the risks of the procedure, including infection, bleeding, tissue injury, clip migration, and inadequate sampling. Informed written  consent was given. The usual time-out protocol was performed immediately prior to the procedure. Biopsy #1: 2.5 cm mass at 1 o'clock, 1 cm from the nipple. Lesion quadrant: Upper outer quadrant Using sterile technique and 1% Lidocaine as local anesthetic, under direct ultrasound visualization, a 12 gauge spring-loaded device was used to perform biopsy of retroareolar left breast mass using a medial approach. At the conclusion of the procedure Q shaped tissue marker clip was deployed into the biopsy cavity. Biopsy #2: Left axillary lymph node. Lesion location: Left axilla Using sterile technique and 1% Lidocaine as local anesthetic, under direct ultrasound visualization, a 14 gauge spring-loaded device was used to perform biopsy of the lymph node with the 4 mm mildly thickened cortex using an inferior approach. At the conclusion of the procedure vision tissue marker clip was deployed into the biopsy cavity. Follow up 2 view mammogram was performed and dictated separately. IMPRESSION: Ultrasound guided biopsy of a retroareolar left breast mass and a single left axillary lymph node. No apparent complications. Electronically Signed: By: Lajean Manes M.D. On: 07/05/2020 09:11     Assessment and plan- Patient is a 65 y.o. female  with clinical prognostic stage IIa invasive mammary carcinoma of the left breast cT2 N0 M0 ER weakly +1 to 10%, PR negative and HER2 positive.  She is here for toxicity check after cycle 1 of neoadjuvant TCHP chemotherapy  She will see covering MD/NP on 08/13/2020 for cycle 2 of TCHP chemotherapy with on for Neulasta support and I will see her back on 09/03/2020 for cycle 3. Patient has not had her baseline MRI yet due to scheduling issues and will be getting it on 08/05/2020.  Abnormal LFTs: Mild continue to monitor  Patient has mild redness in her bilateral dorsal surface of the hands.  She is using topical emollients.  Continue to monitor  Visit Diagnosis 1. Malignant neoplasm of  upper-outer quadrant of left breast in female, estrogen receptor positive ([place])   2. Abnormal LFTs      Dr. Randa Evens, MD, MPH [facility] [phone] 08/03/2020 8:29 AM

## 2020-08-05 ENCOUNTER — Encounter: Payer: Self-pay | Admitting: Oncology

## 2020-08-05 ENCOUNTER — Encounter: Payer: Self-pay | Admitting: *Deleted

## 2020-08-05 ENCOUNTER — Other Ambulatory Visit: Payer: Self-pay | Admitting: Oncology

## 2020-08-05 ENCOUNTER — Other Ambulatory Visit: Payer: Self-pay

## 2020-08-05 ENCOUNTER — Ambulatory Visit
Admission: RE | Admit: 2020-08-05 | Discharge: 2020-08-05 | Disposition: A | Discharge status: Home / Self Care | Payer: BC Managed Care – PPO | Source: Ambulatory Visit | Attending: Oncology | Admitting: Oncology

## 2020-08-05 DIAGNOSIS — N63 Unspecified lump in unspecified breast: Secondary | ICD-10-CM

## 2020-08-05 DIAGNOSIS — C50912 Malignant neoplasm of unspecified site of left female breast: Secondary | ICD-10-CM | POA: Diagnosis not present

## 2020-08-05 MED ORDER — GADOBUTROL 1 MMOL/ML IV SOLN
8.0000 mL | Freq: Once | INTRAVENOUS | Status: AC | PRN
  Administered 2020-08-05: 8 mL via INTRAVENOUS

## 2020-08-05 NOTE — Progress Notes (Signed)
Spoke with Dr. Janese Banks today in regards to patients breast MRI.  MRI shows bilateral breast masses that recommend MRI guided biopsy.  Called and discussed MRI with patient. Discussed if she desired mastectomy or lumpectomy.  Patient states she would like to proceed with bilateral biopsy and based on results, she will decide whether to proceed with lumpectomy or bilateral mastectomy.  Patient scheduled for bilateral MRI guided biopsy on June 2 at 8:10 for a 8:50 appointment.  NPO 4 hours prior.  Can take am meds.

## 2020-08-06 ENCOUNTER — Encounter: Payer: Self-pay | Admitting: *Deleted

## 2020-08-07 ENCOUNTER — Encounter: Payer: Self-pay | Admitting: Oncology

## 2020-08-11 ENCOUNTER — Encounter: Payer: Self-pay | Admitting: Licensed Clinical Social Worker

## 2020-08-11 ENCOUNTER — Ambulatory Visit: Payer: Self-pay | Admitting: Licensed Clinical Social Worker

## 2020-08-11 ENCOUNTER — Telehealth: Payer: Self-pay | Admitting: Licensed Clinical Social Worker

## 2020-08-11 DIAGNOSIS — Z1379 Encounter for other screening for genetic and chromosomal anomalies: Secondary | ICD-10-CM | POA: Insufficient documentation

## 2020-08-11 DIAGNOSIS — Z170 Estrogen receptor positive status [ER+]: Secondary | ICD-10-CM

## 2020-08-11 DIAGNOSIS — C50412 Malignant neoplasm of upper-outer quadrant of left female breast: Secondary | ICD-10-CM

## 2020-08-11 NOTE — Progress Notes (Signed)
HPI:  Wendy Caldwell was previously seen in the [place] [facility] due to a personalk history of breast cancer and concerns regarding a hereditary predisposition to cancer. Please refer to our prior cancer genetics clinic note for more information regarding our discussion, assessment and recommendations, at the time. Wendy Caldwell recent genetic test results were disclosed to her, as were recommendations warranted by these results. These results and recommendations are discussed in more detail below.  CANCER HISTORY:  Oncology History  Malignant neoplasm of upper-outer quadrant of left breast in female, estrogen receptor positive ([place])  07/12/2020 Initial Diagnosis   Malignant neoplasm of upper-outer quadrant of left breast in female, estrogen receptor positive ([place])   07/12/2020 Cancer Staging   Staging form: Breast, AJCC 8th Edition - Clinical stage from 07/12/2020: Stage IIA (cT2, cN0, cM0, G3, ER+, PR-, HER2+) - Signed by Sindy Guadeloupe, MD on 07/12/2020 Stage prefix: Initial diagnosis Histologic grading system: 3 grade system   07/23/2020 -  Chemotherapy    Patient is on Treatment Plan: BREAST  DOCETAXEL + CARBOPLATIN + TRASTUZUMAB + PERTUZUMAB  (TCHP) Q21D       08/05/2020 Genetic Testing   Negative genetic testing. No pathogenic variants identified on the [facility] CancerNext-Expanded+RNA panel. VUS in APC called c.2218G>C, c.3628C>T, c.6779G>A and VUS in CHEK2 called c.1003G>C identified. The report date is 08/05/2020.  The CancerNext-Expanded + RNAinsight gene panel offered by [employer] and includes sequencing and rearrangement analysis for the following 77 genes: IP, ALK, APC*, ATM*, AXIN2, BAP1, BARD1, BLM, BMPR1A, BRCA1*, BRCA2*, BRIP1*, CDC73, CDH1*,CDK4, CDKN1B, CDKN2A, CHEK2*, CTNNA1, DICER1, FANCC, FH, FLCN, GALNT12, KIF1B, LZTR1, MAX, MEN1, MET, MLH1*, MSH2*, MSH3, MSH6*, MUTYH*, NBN, NF1*, NF2, NTHL1, PALB2*, PHOX2B, PMS2*, POT1, PRKAR1A, PTCH1, PTEN*, RAD51C*, RAD51D*,RB1,  RECQL, RET, SDHA, SDHAF2, SDHB, SDHC, SDHD, SMAD4, SMARCA4, SMARCB1, SMARCE1, STK11, SUFU, TMEM127, TP53*,TSC1, TSC2, VHL and XRCC2 (sequencing and deletion/duplication); EGFR, EGLN1, HOXB13, KIT, MITF, PDGFRA, POLD1 and POLE (sequencing only); EPCAM and GREM1 (deletion/duplication only).     FAMILY HISTORY:  We obtained a detailed, 4-generation family history.  Significant diagnoses are listed below: Family History  Adopted: Yes  Problem Relation Age of Onset  . Breast cancer Neg Hx    Wendy Caldwell was adopted and has no information about her biological family. She has 1 daughter, 69, and 1 daughter, 26 but she does not have contact with this daughter.   Wendy Caldwell is unaware of previous family history of genetic testing for hereditary cancer risks. Patient's maternal ancestors are of is of unknown descent, and paternal ancestors are of unknown descent. There is no reported Ashkenazi Jewish ancestry. There is no known consanguinity.  GENETIC TEST RESULTS: Genetic testing reported out on 08/05/2020 through the [facility] CancerNext-Expanded+RNA cancer panel found no pathogenic mutations.   The CancerNext-Expanded + RNAinsight gene panel offered by [employer] and includes sequencing and rearrangement analysis for the following 77 genes: IP, ALK, APC*, ATM*, AXIN2, BAP1, BARD1, BLM, BMPR1A, BRCA1*, BRCA2*, BRIP1*, CDC73, CDH1*,CDK4, CDKN1B, CDKN2A, CHEK2*, CTNNA1, DICER1, FANCC, FH, FLCN, GALNT12, KIF1B, LZTR1, MAX, MEN1, MET, MLH1*, MSH2*, MSH3, MSH6*, MUTYH*, NBN, NF1*, NF2, NTHL1, PALB2*, PHOX2B, PMS2*, POT1, PRKAR1A, PTCH1, PTEN*, RAD51C*, RAD51D*,RB1, RECQL, RET, SDHA, SDHAF2, SDHB, SDHC, SDHD, SMAD4, SMARCA4, SMARCB1, SMARCE1, STK11, SUFU, TMEM127, TP53*,TSC1, TSC2, VHL and XRCC2 (sequencing and deletion/duplication); EGFR, EGLN1, HOXB13, KIT, MITF, PDGFRA, POLD1 and POLE (sequencing only); EPCAM and GREM1 (deletion/duplication only).  The test report has been scanned into EPIC and is located under  the Molecular Pathology section  of the Results Review tab.  A portion of the result report is included below for reference.    We discussed with Wendy Caldwell that because current genetic testing is not perfect, it is possible there may be a gene mutation in one of these genes that current testing cannot detect, but that chance is small.  We also discussed that there could be another gene that has not yet been discovered or that we have not yet tested that is responsible for the cancer diagnoses in the family. It is also possible there is a hereditary cause for the cancer in the family that Wendy Caldwell did not inherit and therefore was not identified in her testing.  Therefore, it is important to remain in touch with cancer genetics in the future so that we can continue to offer Wendy Caldwell the most up to date genetic testing.   Genetic testing did identify 4 Variants of uncertain significance (VUS) - one in the APC gene called c.2218G>C, a second in the APC gene called c.3628C>T, a third in the APC gene called c.6779G>A, and a fourth in the CHEK2 gene called c.1003G>C.  At this time, it is unknown if these variants are associated with increased cancer risk or if they are normal findings, but most variants such as these get reclassified to being inconsequential. They should not be used to make medical management decisions. With time, we suspect the lab will determine the significance of these variants, if any. If we do learn more about them, we will try to contact Wendy Caldwell to discuss it further. However, it is important to stay in touch with [place] periodically and keep the address and phone number up to date.  ADDITIONAL GENETIC TESTING: We discussed with Wendy Caldwell that her genetic testing was fairly extensive.  If there are genes identified to increase cancer risk that can be analyzed in the future, we would be happy to discuss and coordinate this testing at that time.    CANCER SCREENING RECOMMENDATIONS: Ms.  Caldwell test result is considered negative (normal).  This means that we have not identified a hereditary cause for her  Personal history of cancer at this time. Most cancers happen by chance and this negative test suggests that her cancer may fall into this category.    While reassuring, this does not definitively rule out a hereditary predisposition to cancer. It is still possible that there could be genetic mutations that are undetectable by current technology. There could be genetic mutations in genes that have not been tested or identified to increase cancer risk.  Therefore, it is recommended she continue to follow the cancer management and screening guidelines provided by her oncology and primary healthcare provider.   An individual's cancer risk and medical management are not determined by genetic test results alone. Overall cancer risk assessment incorporates additional factors, including personal medical history, family history, and any available genetic information that may result in a personalized plan for cancer prevention and surveillance.  RECOMMENDATIONS FOR FAMILY MEMBERS:  Relatives in this family might be at some increased risk of developing cancer, over the general population risk, simply due to the family history of cancer.  We recommended female relatives in this family have a yearly mammogram beginning at age 43, or 35 years younger than the earliest onset of cancer, an annual clinical breast exam, and perform monthly breast self-exams. Female relatives in this family should also have a gynecological exam as recommended by their primary provider.  All family  members should be referred for colonoscopy starting at age 33.   FOLLOW-UP: Lastly, we discussed with Wendy Caldwell that cancer genetics is a rapidly advancing field and it is possible that new genetic tests will be appropriate for her and/or her family members in the future. We encouraged her to remain in contact with cancer genetics  on an annual basis so we can update her personal and family histories and let her know of advances in cancer genetics that may benefit this family.   Our contact number was provided. Wendy Caldwell questions were answered to her satisfaction, and she knows she is welcome to call us at anytime with additional questions or concerns.   Faith Rogue, MS, [facility] Genetic Counselor [email or web address] Phone: [phone]

## 2020-08-11 NOTE — Telephone Encounter (Signed)
Revealed negative genetic testing.  Revealed that 3 VUS in APC and 1 in CHEK2 were identified. This normal result is reassuring and indicates that it is unlikely Wendy Caldwell cancer is due to a hereditary cause.  It is unlikely that there is an increased risk of another cancer due to a mutation in one of these genes.  However, genetic testing is not perfect, and cannot definitively rule out a hereditary cause.  It will be important for her to keep in contact with genetics to learn if any additional testing may be needed in the future.

## 2020-08-12 ENCOUNTER — Ambulatory Visit
Admission: RE | Admit: 2020-08-12 | Discharge: 2020-08-12 | Disposition: A | Discharge status: Home / Self Care | Payer: BC Managed Care – PPO | Source: Ambulatory Visit | Attending: Oncology | Admitting: Oncology

## 2020-08-12 ENCOUNTER — Encounter: Payer: Self-pay | Admitting: *Deleted

## 2020-08-12 ENCOUNTER — Other Ambulatory Visit: Payer: Self-pay

## 2020-08-12 DIAGNOSIS — N6012 Diffuse cystic mastopathy of left breast: Secondary | ICD-10-CM | POA: Diagnosis not present

## 2020-08-12 DIAGNOSIS — H2513 Age-related nuclear cataract, bilateral: Secondary | ICD-10-CM | POA: Diagnosis not present

## 2020-08-12 DIAGNOSIS — N6321 Unspecified lump in the left breast, upper outer quadrant: Secondary | ICD-10-CM | POA: Diagnosis not present

## 2020-08-12 DIAGNOSIS — N63 Unspecified lump in unspecified breast: Secondary | ICD-10-CM

## 2020-08-12 DIAGNOSIS — R928 Other abnormal and inconclusive findings on diagnostic imaging of breast: Secondary | ICD-10-CM | POA: Diagnosis not present

## 2020-08-12 DIAGNOSIS — H353131 Nonexudative age-related macular degeneration, bilateral, early dry stage: Secondary | ICD-10-CM | POA: Diagnosis not present

## 2020-08-12 DIAGNOSIS — N6031 Fibrosclerosis of right breast: Secondary | ICD-10-CM | POA: Diagnosis not present

## 2020-08-12 MED ORDER — GADOBUTROL 1 MMOL/ML IV SOLN
8.0000 mL | Freq: Once | INTRAVENOUS | Status: AC | PRN
  Administered 2020-08-12: 8 mL via INTRAVENOUS

## 2020-08-13 ENCOUNTER — Other Ambulatory Visit: Payer: Self-pay

## 2020-08-13 ENCOUNTER — Inpatient Hospital Stay: Payer: BC Managed Care – PPO

## 2020-08-13 ENCOUNTER — Encounter: Payer: Self-pay | Admitting: Oncology

## 2020-08-13 ENCOUNTER — Inpatient Hospital Stay: Payer: BC Managed Care – PPO | Attending: Oncology

## 2020-08-13 ENCOUNTER — Inpatient Hospital Stay (HOSPITAL_BASED_OUTPATIENT_CLINIC_OR_DEPARTMENT_OTHER): Payer: BC Managed Care – PPO | Admitting: Oncology

## 2020-08-13 VITALS — BP 139/87 | HR 104 | Temp 97.8°F | Resp 16 | Ht 63.0 in | Wt 176.6 lb

## 2020-08-13 DIAGNOSIS — Z170 Estrogen receptor positive status [ER+]: Secondary | ICD-10-CM | POA: Diagnosis not present

## 2020-08-13 DIAGNOSIS — Z5112 Encounter for antineoplastic immunotherapy: Secondary | ICD-10-CM | POA: Diagnosis not present

## 2020-08-13 DIAGNOSIS — Z5111 Encounter for antineoplastic chemotherapy: Secondary | ICD-10-CM | POA: Insufficient documentation

## 2020-08-13 DIAGNOSIS — C50412 Malignant neoplasm of upper-outer quadrant of left female breast: Secondary | ICD-10-CM | POA: Insufficient documentation

## 2020-08-13 DIAGNOSIS — R945 Abnormal results of liver function studies: Secondary | ICD-10-CM | POA: Diagnosis not present

## 2020-08-13 DIAGNOSIS — Z79899 Other long term (current) drug therapy: Secondary | ICD-10-CM | POA: Diagnosis not present

## 2020-08-13 DIAGNOSIS — R7989 Other specified abnormal findings of blood chemistry: Secondary | ICD-10-CM

## 2020-08-13 DIAGNOSIS — Z5189 Encounter for other specified aftercare: Secondary | ICD-10-CM | POA: Diagnosis not present

## 2020-08-13 DIAGNOSIS — Z95828 Presence of other vascular implants and grafts: Secondary | ICD-10-CM

## 2020-08-13 LAB — COMPREHENSIVE METABOLIC PANEL
ALT: 197 U/L — ABNORMAL HIGH (ref 0–44)
AST: 56 U/L — ABNORMAL HIGH (ref 15–41)
Albumin: 3.9 g/dL (ref 3.5–5.0)
Alkaline Phosphatase: 167 U/L — ABNORMAL HIGH (ref 38–126)
Anion gap: 11 (ref 5–15)
BUN: 17 mg/dL (ref 8–23)
CO2: 22 mmol/L (ref 22–32)
Calcium: 9.3 mg/dL (ref 8.9–10.3)
Chloride: 104 mmol/L (ref 98–111)
Creatinine, Ser: 0.83 mg/dL (ref 0.44–1.00)
GFR, Estimated: >60 mL/min (ref >60)
Glucose, Bld: 169 mg/dL — ABNORMAL HIGH (ref 70–99)
Potassium: 3.8 mmol/L (ref 3.5–5.1)
Sodium: 137 mmol/L (ref 135–145)
Total Bilirubin: 1 mg/dL (ref 0.3–1.2)
Total Protein: 7.4 g/dL (ref 6.5–8.1)

## 2020-08-13 LAB — CBC WITH DIFFERENTIAL/PLATELET
Abs Immature Granulocytes: 0.04 10*3/uL (ref 0.00–0.07)
Basophils Absolute: 0 10*3/uL (ref 0.0–0.1)
Basophils Relative: 0 %
Eosinophils Absolute: 0 10*3/uL (ref 0.0–0.5)
Eosinophils Relative: 0 %
HCT: 33.8 % — ABNORMAL LOW (ref 36.0–46.0)
Hemoglobin: 11.1 g/dL — ABNORMAL LOW (ref 12.0–15.0)
Immature Granulocytes: 0 %
Lymphocytes Relative: 17 %
Lymphs Abs: 1.6 10*3/uL (ref 0.7–4.0)
MCH: 20.6 pg — ABNORMAL LOW (ref 26.0–34.0)
MCHC: 32.8 g/dL (ref 30.0–36.0)
MCV: 62.6 fL — ABNORMAL LOW (ref 80.0–100.0)
Monocytes Absolute: 0.5 10*3/uL (ref 0.1–1.0)
Monocytes Relative: 5 %
Neutro Abs: 7.3 10*3/uL (ref 1.7–7.7)
Neutrophils Relative %: 78 %
Platelets: 435 10*3/uL — ABNORMAL HIGH (ref 150–400)
RBC: 5.4 MIL/uL — ABNORMAL HIGH (ref 3.87–5.11)
RDW: 16.2 % — ABNORMAL HIGH (ref 11.5–15.5)
WBC: 9.4 10*3/uL (ref 4.0–10.5)
nRBC: 0 % (ref 0.0–0.2)

## 2020-08-13 MED ORDER — HEPARIN SOD (PORK) LOCK FLUSH 100 UNIT/ML IV SOLN
500.0000 [IU] | Freq: Once | INTRAVENOUS | Status: AC
Start: 2020-08-13 — End: 2020-08-13
  Administered 2020-08-13: 500 [IU] via INTRAVENOUS
  Filled 2020-08-13: qty 5

## 2020-08-13 MED ORDER — SODIUM CHLORIDE 0.9% FLUSH
10.0000 mL | Freq: Once | INTRAVENOUS | Status: AC
Start: 2020-08-13 — End: 2020-08-13
  Administered 2020-08-13: 10 mL via INTRAVENOUS
  Filled 2020-08-13: qty 10

## 2020-08-13 NOTE — Progress Notes (Signed)
Patient here for pre treatment check she has many concerns regarding her treatment plan. Writer will contact Tanya Nones.

## 2020-08-13 NOTE — Progress Notes (Signed)
   Hematology/Oncology Consult note [facility]  [phone] [phone]  Patient Care Team: Anderson, Marshall W, MD as PCP - General (Internal Medicine) Lambert, Sheena M, RN as Oncology Nurse Navigator   Name of the patient: Wendy Caldwell  [id number]  01/21/1956   Date of visit: 08/13/20  Diagnosis- left breast cancer clinical prognostic stage IIA cT2 N0 M0 ER weakly positive, PR negative HER2 positive  Chief complaint/ Reason for visit- cycle 2 of TCHP chemotherapy neoadjuvant  Heme/Onc history: Patient is a 65-year-old female with a past medical history significant for psoriasis for which she is on Humira. She self palpated a left breast mass which prompted a bilateral diagnostic mammogram on 06/25/2020. That showed an irregular hypoechoic mass 2.5 x 1.7 x 1.8 cm at 1 o'clock position 1 cm from the nipple. 3 cm from the nipple were benign cysts. 1 mildly enlarged left axillary lymph node with cortical thickening 4 mm. Both the breast mass and the lymph node were biopsied. Left breast biopsy was consistent with invasive mammary carcinoma grade 3 ER weakly +1 to 10%, PR negative and HER2 positive by IHC.   Plan is to proceed with 6 cycles of neoadjuvant TCHP chemotherapy followed by surgery  Interval history-she received cycle 1 of TCHP chemotherapy approximately 3 weeks ago.  Tolerated well.  She had a breast MRI yesterday and needed to biopsies.  She has concerns about her MRI and is eager for the biopsy results.  She was also seen by an eye doctor yesterday for blurry vision.  She was given a OTC eyedrop to help with dry eyes.  She also started on a supplement for macular degeneration.  The first dose was this morning.  Dryness in her hands has improved and essentially resolved.  ECOG PS- 0 Pain scale- 0   Review of systems- Review of Systems  Constitutional: Negative.  Negative for chills, fever, malaise/fatigue and weight loss.   HENT: Negative for congestion, ear pain and tinnitus.   Eyes: Positive for blurred vision (d/t dry eyes). Negative for double vision.  Respiratory: Negative.  Negative for cough, sputum production and shortness of breath.   Cardiovascular: Negative.  Negative for chest pain, palpitations and leg swelling.  Gastrointestinal: Negative.  Negative for abdominal pain, constipation, diarrhea, nausea and vomiting.  Genitourinary: Negative for dysuria, frequency and urgency.  Musculoskeletal: Negative for back pain and falls.  Skin: Negative.  Negative for rash.  Neurological: Negative.  Negative for weakness and headaches.  Endo/Heme/Allergies: Negative.  Does not bruise/bleed easily.  Psychiatric/Behavioral: Negative.  Negative for depression. The patient is not nervous/anxious and does not have insomnia.        No Known Allergies   Past Medical History:  Diagnosis Date  . Family history of adverse reaction to anesthesia    adopted  . GERD (gastroesophageal reflux disease)      Past Surgical History:  Procedure Laterality Date  . BREAST BIOPSY Left 07/05/2020   US Bx, Q clip, path pending  . BREAST BIOPSY Left 07/05/2020   US Bx, Vision, path pending   . CARPAL TUNNEL RELEASE Bilateral   . CHOLECYSTECTOMY    . GANGLION CYST EXCISION     wrist  . PORTACATH PLACEMENT Right 07/21/2020   Procedure: INSERTION PORT-A-CATH;  Surgeon: Cintron-Diaz, Edgardo, MD;  Location: [facility] ORS;  Service: General;  Laterality: Right;  . right ovary removal      Social History   Socioeconomic History  . Marital status: Married      Spouse name: Not on file  . Number of children: Not on file  . Years of education: Not on file  . Highest education level: Not on file  Occupational History  . Not on file  Tobacco Use  . Smoking status: Former Smoker    Start date: 2012  . Smokeless tobacco: Never Used  Vaping Use  . Vaping Use: Never used  Substance and Sexual Activity  . Alcohol use: Yes     Comment: occassional   . Drug use: Never  . Sexual activity: Not on file  Other Topics Concern  . Not on file  Social History Narrative  . Not on file   Social Determinants of Health   Financial Resource Strain: Not on file  Food Insecurity: Not on file  Transportation Needs: Not on file  Physical Activity: Not on file  Stress: Not on file  Social Connections: Not on file  Intimate Partner Violence: Not on file    Family History  Adopted: Yes  Problem Relation Age of Onset  . Breast cancer Neg Hx      Current Outpatient Medications:  .  Adalimumab (HUMIRA) 40 MG/0.8ML PSKT, Inject 40 mg into the skin every 14 (fourteen) days., Disp: , Rfl:  .  dexamethasone (DECADRON) 4 MG tablet, Take 2 tablets (8 mg total) by mouth 2 (two) times daily. Start the day before Taxotere. Then take daily x 3 days after chemotherapy., Disp: 30 tablet, Rfl: 1 .  lidocaine-prilocaine (EMLA) cream, Apply to affected area once (Patient taking differently: Apply 1 application topically daily as needed (prior to port access).), Disp: 30 g, Rfl: 3 .  loratadine (CLARITIN) 10 MG tablet, Take 10 mg by mouth daily., Disp: , Rfl:  .  LORazepam (ATIVAN) 0.5 MG tablet, Take 1 tablet (0.5 mg total) by mouth every 6 (six) hours as needed (Nausea or vomiting)., Disp: 30 tablet, Rfl: 0 .  omeprazole (PRILOSEC) 40 MG capsule, Take 40 mg by mouth daily., Disp: , Rfl:  .  ondansetron (ZOFRAN) 8 MG tablet, Take 1 tablet (8 mg total) by mouth 2 (two) times daily as needed (Nausea or vomiting). Start on the third day after chemotherapy., Disp: 30 tablet, Rfl: 1 .  prochlorperazine (COMPAZINE) 10 MG tablet, Take 1 tablet (10 mg total) by mouth every 6 (six) hours as needed (Nausea or vomiting)., Disp: 30 tablet, Rfl: 1 .  triamcinolone ointment (KENALOG) 0.5 %, Apply 1 application topically 2 (two) times daily., Disp: 30 g, Rfl: 0 .  vitamin B-12 (CYANOCOBALAMIN) 1000 MCG tablet, Take 1,000 mcg by mouth daily., Disp: , Rfl:   .  vitamin C (ASCORBIC ACID) 500 MG tablet, Take 500 mg by mouth daily., Disp: , Rfl:   Physical exam:  Vitals:   08/13/20 0836  BP: 139/87  Pulse: (!) 104  Resp: 16  Temp: 97.8 F (36.6 C)  TempSrc: Tympanic  Weight: 176 lb 9.6 oz (80.1 kg)  Height: 5' 3" (1.6 m)   Physical Exam Constitutional:      Appearance: Normal appearance.  HENT:     Head: Normocephalic and atraumatic.  Eyes:     Pupils: Pupils are equal, round, and reactive to light.  Cardiovascular:     Rate and Rhythm: Normal rate and regular rhythm.     Heart sounds: Normal heart sounds. No murmur heard.   Pulmonary:     Effort: Pulmonary effort is normal.     Breath sounds: Normal breath sounds. No wheezing.  Abdominal:       General: Bowel sounds are normal. There is no distension.     Palpations: Abdomen is soft.     Tenderness: There is no abdominal tenderness.  Musculoskeletal:        General: Normal range of motion.     Cervical back: Normal range of motion.  Skin:    General: Skin is warm and dry.     Findings: No rash.  Neurological:     Mental Status: She is alert and oriented to person, place, and time.  Psychiatric:        Judgment: Judgment normal.      CMP Latest Ref Rng & Units 08/13/2020  Glucose 70 - 99 mg/dL 169(H)  BUN 8 - 23 mg/dL 17  Creatinine 0.44 - 1.00 mg/dL 0.83  Sodium 135 - 145 mmol/L 137  Potassium 3.5 - 5.1 mmol/L 3.8  Chloride 98 - 111 mmol/L 104  CO2 22 - 32 mmol/L 22  Calcium 8.9 - 10.3 mg/dL 9.3  Total Protein 6.5 - 8.1 g/dL 7.4  Total Bilirubin 0.3 - 1.2 mg/dL 1.0  Alkaline Phos 38 - 126 U/L 167(H)  AST 15 - 41 U/L 56(H)  ALT 0 - 44 U/L 197(H)   CBC Latest Ref Rng & Units 08/13/2020  WBC 4.0 - 10.5 K/uL 9.4  Hemoglobin 12.0 - 15.0 g/dL 11.1(L)  Hematocrit 36.0 - 46.0 % 33.8(L)  Platelets 150 - 400 K/uL 435(H)    No images are attached to the encounter.  MR BREAST BILATERAL W WO CONTRAST INC CAD  Result Date: 08/05/2020 CLINICAL DATA:  Recently diagnosed  left invasive mammary carcinoma in the retroareolar region. LABS:  None EXAM: BILATERAL BREAST MRI WITH AND WITHOUT CONTRAST TECHNIQUE: Multiplanar, multisequence MR images of both breasts were obtained prior to and following the intravenous administration of 8 ml of Gadavist Three-dimensional MR images were rendered by post-processing of the original MR data on an independent workstation. The three-dimensional MR images were interpreted, and findings are reported in the following complete MRI report for this study. Three dimensional images were evaluated at the independent interpreting workstation using the DynaCAD thin client. COMPARISON:  Recent mammography FINDINGS: Due to motion, the images in PACS are of poor quality. The motion corrected DynaCAD images are of much higher quality. Images of pertinent findings in both breasts were captured on DynaCAD and sent to PACS. Breast composition: c. Heterogeneous fibroglandular tissue. Background parenchymal enhancement: Mild Right breast: There is linear enhancement in the right breast located in a retroareolar region at an anterior to mid depth spanning 12.4 mm. No other suspicious findings are seen in the right breast. A Port-A-Cath is identified in the upper inner right breast. Left breast: The patient's known malignancy in the left retroareolar region measures 18 x 22 by 19 mm. There appears to be nipple invasion. Another mass is seen in the upper slightly outer left breast measuring 10 x 6 x 7 mm. No other suspicious findings are identified in the left breast. Lymph nodes: No abnormal appearing lymph nodes. Ancillary findings:  None. IMPRESSION: 1. The patient's known malignancy in the left retroareolar region appears to invade at least the base of the nipple. In total, this mass measures approximately 18 x 22 x 19 mm. 2. A second mass in the upper slightly outer left breast measures 10 x 6 x 7 mm. 3. 12.4 mm of linear enhancement is seen in the right retroareolar  region at an anterior to mid depth. RECOMMENDATION: If breast conservation is being considered, recommend biopsying   the second mass in the upper slightly outer left breast measuring 10 x 6 x 7 mm. This biopsy is not necessary if the patient is proceeding to mastectomy. Recommend MRI guided biopsy of the linear enhancement in the right retroareolar region spanning 12.4 mm. BI-RADS CATEGORY  4: Suspicious. Electronically Signed   By: Dorise Bullion III M.D   On: 08/05/2020 12:32   DG Chest Port 1 View  Result Date: 07/21/2020 CLINICAL DATA:  Port-A-Cath insertion EXAM: PORTABLE CHEST 1 VIEW COMPARISON:  None. FINDINGS: Right-sided central venous port tip over the SVC origin. No right pneumothorax. No consolidation or effusion. Coarse interstitial opacity which could be secondary to bronchitic changes. Normal heart size. IMPRESSION: 1. Right-sided central venous port tip over the SVC origin. No pneumothorax 2. Bronchitic changes Electronically Signed   By: Donavan Foil M.D.   On: 07/21/2020 17:55   DG C-Arm 1-60 Min-No Report  Result Date: 07/21/2020 Fluoroscopy was utilized by the requesting physician.  No radiographic interpretation.   ECHOCARDIOGRAM COMPLETE  Result Date: 07/20/2020    ECHOCARDIOGRAM REPORT   Patient Name:   SHAHIRA FISKE [facility] Date of Exam: 07/20/2020 Medical Rec #:  [id number]          Height:       63.0 in Accession #:    [id number]         Weight:       181.0 lb Date of Birth:  September 17, 1955         BSA:          1.853 m Patient Age:    4 years           BP:           145/70 mmHg Patient Gender: F                  HR:           82 bpm. Exam Location:  [facility] Procedure: 2D Echo, Color Doppler, Cardiac Doppler and Strain Analysis Indications:     Z09 Chemo  History:         Patient has no prior history of Echocardiogram examinations.                  Malignant neoplasm of upper-outer quadrant of left breast in                  female, estrogen receptor positive.  Sonographer:     Charmayne Sheer RDCS (AE) Referring Phys:  [id number] Weston Anna Caldwell Diagnosing Phys: Kate Sable MD  Sonographer Comments: Suboptimal parasternal window. Global longitudinal strain was attempted. IMPRESSIONS  1. Left ventricular ejection fraction, by estimation, is 60 to 65%. The left ventricle has normal function. The left ventricle has no regional wall motion abnormalities. Left ventricular diastolic parameters were normal. The average left ventricular global longitudinal strain is -25.3 %. The global longitudinal strain is normal.  2. Right ventricular systolic function is normal. The right ventricular size is normal.  3. The mitral valve is normal in structure. Mild mitral valve regurgitation.  4. The aortic valve was not well visualized. Aortic valve regurgitation is not visualized.  5. The inferior vena cava is normal in size with greater than 50% respiratory variability, suggesting right atrial pressure of 3 mmHg. FINDINGS  Left Ventricle: Left ventricular ejection fraction, by estimation, is 60 to 65%. The left ventricle has normal function. The left ventricle has no regional wall motion abnormalities. The average left ventricular global  longitudinal strain is -25.3 %. The global longitudinal strain is normal. The left ventricular internal cavity size was normal in size. There is no left ventricular hypertrophy. Left ventricular diastolic parameters were normal. Right Ventricle: The right ventricular size is normal. No increase in right ventricular wall thickness. Right ventricular systolic function is normal. Left Atrium: Left atrial size was normal in size. Right Atrium: Right atrial size was normal in size. Pericardium: There is no evidence of pericardial effusion. Mitral Valve: The mitral valve is normal in structure. Mild mitral valve regurgitation. MV peak gradient, 2.9 mmHg. The mean mitral valve gradient is 1.0 mmHg. Tricuspid Valve: The tricuspid valve is normal in structure. Tricuspid valve regurgitation  is not demonstrated. Aortic Valve: The aortic valve was not well visualized. Aortic valve regurgitation is not visualized. Aortic valve mean gradient measures 4.0 mmHg. Aortic valve peak gradient measures 8.4 mmHg. Aortic valve area, by VTI measures 1.93 cm. Pulmonic Valve: The pulmonic valve was not well visualized. Pulmonic valve regurgitation is not visualized. Aorta: The aortic root is normal in size and structure. Venous: The inferior vena cava is normal in size with greater than 50% respiratory variability, suggesting right atrial pressure of 3 mmHg. IAS/Shunts: No atrial level shunt detected by color flow Doppler.  LEFT VENTRICLE PLAX 2D LVIDd:         3.30 cm  Diastology LVIDs:         2.30 cm  LV e' medial:    8.38 cm/s LV PW:         0.90 cm  LV E/e' medial:  9.2 LV IVS:        0.80 cm  LV e' lateral:   12.20 cm/s LVOT diam:     1.70 cm  LV E/e' lateral: 6.3 LV SV:         55 LV SV Index:   30       2D Longitudinal Strain LVOT Area:     2.27 cm 2D Strain GLS Avg:     -25.3 %  RIGHT VENTRICLE RV Basal diam:  2.30 cm TAPSE (M-mode): 3.2 cm LEFT ATRIUM           Index       RIGHT ATRIUM           Index LA diam:      3.20 cm 1.73 cm/m  RA Area:     11.20 cm LA Vol (A2C): 38.5 ml 20.77 ml/m RA Volume:   23.20 ml  12.52 ml/m LA Vol (A4C): 20.8 ml 11.22 ml/m  AORTIC VALVE                   PULMONIC VALVE AV Area (Vmax):    1.72 cm    PV Vmax:       1.09 m/s AV Area (Vmean):   1.71 cm    PV Vmean:      76.900 cm/s AV Area (VTI):     1.93 cm    PV VTI:        0.233 m AV Vmax:           145.00 cm/s PV Peak grad:  4.8 mmHg AV Vmean:          97.800 cm/s PV Mean grad:  3.0 mmHg AV VTI:            0.286 m AV Peak Grad:      8.4 mmHg AV Mean Grad:      4.0 mmHg LVOT Vmax:  110.00 cm/s LVOT Vmean:        73.500 cm/s LVOT VTI:          0.243 m LVOT/AV VTI ratio: 0.85  AORTA Ao Root diam: 2.50 cm MITRAL VALVE MV Area (PHT): 3.54 cm    SHUNTS MV Area VTI:   2.64 cm    Systemic VTI:  0.24 m MV Peak grad:   2.9 mmHg    Systemic Diam: 1.70 cm MV Mean grad:  1.0 mmHg MV Vmax:       0.85 m/s MV Vmean:      54.1 cm/s MV Decel Time: 214 msec MV E velocity: 77.10 cm/s MV A velocity: 63.40 cm/s MV E/A ratio:  1.22 Brian Agbor-Etang MD Electronically signed by Brian Agbor-Etang MD Signature Date/Time: 07/20/2020/2:11:37 PM    Final    MM CLIP PLACEMENT LEFT  Result Date: 08/12/2020 CLINICAL DATA:  Post MRI guided biopsy of an enhancing mass in the upper-outer left breast and MRI guided biopsy of linear enhancement in the retroareolar right breast. EXAM: DIAGNOSTIC BILATERAL MAMMOGRAM POST MRI BIOPSY COMPARISON:  Previous exams. FINDINGS: Mammographic images were obtained following MRI guided biopsy of an enhancing mass in the upper-outer left breast and linear enhancement in the retroareolar right breast. A dumbbell shaped biopsy marking clip is present at the site of the biopsied enhancing mass in the upper-outer left breast. A dumbbell shaped biopsy marking clip is present in the retroareolar right breast at site of biopsied linear enhancement. IMPRESSION: 1. Dumbbell shaped biopsy marking clip at site of biopsied enhancing mass in the upper-outer left breast. 2. Dumbbell shaped biopsy marking clip at site of biopsy linear enhancement in the retroareolar right breast. Final Assessment: Post Procedure Mammograms for Marker Placement Electronically Signed   By: Jennifer  Jarosz M.D.   On: 08/12/2020 10:28   MM CLIP PLACEMENT RIGHT  Result Date: 08/12/2020 CLINICAL DATA:  Post MRI guided biopsy of an enhancing mass in the upper-outer left breast and MRI guided biopsy of linear enhancement in the retroareolar right breast. EXAM: DIAGNOSTIC BILATERAL MAMMOGRAM POST MRI BIOPSY COMPARISON:  Previous exams. FINDINGS: Mammographic images were obtained following MRI guided biopsy of an enhancing mass in the upper-outer left breast and linear enhancement in the retroareolar right breast. A dumbbell shaped biopsy marking clip is  present at the site of the biopsied enhancing mass in the upper-outer left breast. A dumbbell shaped biopsy marking clip is present in the retroareolar right breast at site of biopsied linear enhancement. IMPRESSION: 1. Dumbbell shaped biopsy marking clip at site of biopsied enhancing mass in the upper-outer left breast. 2. Dumbbell shaped biopsy marking clip at site of biopsy linear enhancement in the retroareolar right breast. Final Assessment: Post Procedure Mammograms for Marker Placement Electronically Signed   By: Jennifer  Jarosz M.D.   On: 08/12/2020 10:28   MR LT BREAST BX W LOC DEV 1ST LESION IMAGE BX SPEC MR GUIDE  Result Date: 08/12/2020 CLINICAL DATA:  64-year-old female with recent diagnosis of invasive mammary carcinoma in the retroareolar left breast presents for MRI guided biopsy a second 10 mm mass in the upper slightly outer left breast and and a 12 mm area of linear enhancement in the retroareolar right breast anterior to mid depth. EXAM: MRI GUIDED CORE NEEDLE BIOPSY OF THE BILATERAL BREASTS TECHNIQUE: Multiplanar, multisequence MR imaging of the bilateral breasts was performed both before and after administration of intravenous contrast. CONTRAST:  8 mL Gadavist. COMPARISON:  Previous exams. FINDINGS: I met with the   patient, and we discussed the procedure of MRI guided biopsy, including risks, benefits, and alternatives. Specifically, we discussed the risks of infection, bleeding, tissue injury, clip migration, and inadequate sampling. Informed, written consent was given. The usual time out protocol was performed immediately prior to the procedure. SITE 1 LEFT BREAST UPPER OUTER 10 MM ENHANCING MASS: Using sterile technique, 1% Lidocaine, MRI guidance, and a 9 gauge vacuum assisted device, biopsy was performed of the enhancing mass in the upper-outer left breast using a lateral to medial approach. At the conclusion of the procedure, a dumbbell shaped tissue marker clip was deployed into the  biopsy cavity. Follow-up 2-view mammogram was performed and dictated separately. SITE 2 RIGHT BREAST RETROAREOLAR 12 MM LINEAR ENHANCEMENT: Using sterile technique, 1% Lidocaine, MRI guidance, and a 9 gauge vacuum assisted device, biopsy was performed of the linear enhancement in the retroareolar right breast using a lateral to medial approach. At the conclusion of the procedure, a dumbbell shaped tissue marker clip was deployed into the biopsy cavity. Follow-up 2-view mammogram was performed and dictated separately. IMPRESSION: 1. MRI guided biopsy of the enhancing mass in the upper-outer left breast. 2. MRI guided biopsy of the linear enhancement in the retroareolar right breast. Electronically Signed   By: Jennifer  Jarosz M.D.   On: 08/12/2020 10:05   MR RT BREAST BX W LOC DEV 1ST LESION IMAGE BX SPEC MR GUIDE  Result Date: 08/12/2020 CLINICAL DATA:  64-year-old female with recent diagnosis of invasive mammary carcinoma in the retroareolar left breast presents for MRI guided biopsy a second 10 mm mass in the upper slightly outer left breast and and a 12 mm area of linear enhancement in the retroareolar right breast anterior to mid depth. EXAM: MRI GUIDED CORE NEEDLE BIOPSY OF THE BILATERAL BREASTS TECHNIQUE: Multiplanar, multisequence MR imaging of the bilateral breasts was performed both before and after administration of intravenous contrast. CONTRAST:  8 mL Gadavist. COMPARISON:  Previous exams. FINDINGS: I met with the patient, and we discussed the procedure of MRI guided biopsy, including risks, benefits, and alternatives. Specifically, we discussed the risks of infection, bleeding, tissue injury, clip migration, and inadequate sampling. Informed, written consent was given. The usual time out protocol was performed immediately prior to the procedure. SITE 1 LEFT BREAST UPPER OUTER 10 MM ENHANCING MASS: Using sterile technique, 1% Lidocaine, MRI guidance, and a 9 gauge vacuum assisted device, biopsy was  performed of the enhancing mass in the upper-outer left breast using a lateral to medial approach. At the conclusion of the procedure, a dumbbell shaped tissue marker clip was deployed into the biopsy cavity. Follow-up 2-view mammogram was performed and dictated separately. SITE 2 RIGHT BREAST RETROAREOLAR 12 MM LINEAR ENHANCEMENT: Using sterile technique, 1% Lidocaine, MRI guidance, and a 9 gauge vacuum assisted device, biopsy was performed of the linear enhancement in the retroareolar right breast using a lateral to medial approach. At the conclusion of the procedure, a dumbbell shaped tissue marker clip was deployed into the biopsy cavity. Follow-up 2-view mammogram was performed and dictated separately. IMPRESSION: 1. MRI guided biopsy of the enhancing mass in the upper-outer left breast. 2. MRI guided biopsy of the linear enhancement in the retroareolar right breast. Electronically Signed   By: Jennifer  Jarosz M.D.   On: 08/12/2020 10:05     Assessment and plan- Patient is a 64 y.o. female  with clinical prognostic stage IIa invasive mammary carcinoma of the left breast cT2 N0 M0 ER weakly +1 to 10%, PR negative and   HER2 positive.  She is here for cycle 2 of neoadjuvant TCHP chemotherapy.  She had breast MRI on 08/05/2020 which showed the known malignancy in the left retroareolar region that appears to invade the base of the nipple.  A second mass in the upper slightly outer left breast revealing a linear enhancement in the right retroareolar region.  Recommended biopsy of both. Results are pending.    She tolerated cycle 1 well.  Reports essentially no side effects.  Labs from 08/13/2020 show worsening of her LFTs - AST 56 (29) ALT 197 (53).  Started on a vitamin for macular degeneration this morning.  I have asked her to hold this until we can redraw her labs.  Otherwise no new medications.  Spoke with Dr. Yu who recommends holding treatment.  RTC in 1 week to repeat lab work (CBC, CMP), NP  assessment and possible treatment.  If LFTs do not resolve, recommend abdominal imaging.  Greater than 50% was spent in counseling and coordination of care with this patient including but not limited to discussion of the relevant topics above (See A&P) including, but not limited to diagnosis and management of acute and chronic medical conditions.   Visit Diagnosis 1. Abnormal LFTs   2. Malignant neoplasm of upper-outer quadrant of left breast in female, estrogen receptor positive (HCC)      Jennifer Burns, NP 08/13/2020 12:59 PM                

## 2020-08-19 ENCOUNTER — Inpatient Hospital Stay: Payer: BC Managed Care – PPO

## 2020-08-19 ENCOUNTER — Inpatient Hospital Stay (HOSPITAL_BASED_OUTPATIENT_CLINIC_OR_DEPARTMENT_OTHER): Payer: BC Managed Care – PPO | Admitting: Oncology

## 2020-08-19 ENCOUNTER — Encounter: Payer: Self-pay | Admitting: Oncology

## 2020-08-19 VITALS — BP 135/76 | HR 95 | Temp 97.8°F | Resp 20 | Wt 177.3 lb

## 2020-08-19 DIAGNOSIS — Z5112 Encounter for antineoplastic immunotherapy: Secondary | ICD-10-CM | POA: Diagnosis not present

## 2020-08-19 DIAGNOSIS — R7989 Other specified abnormal findings of blood chemistry: Secondary | ICD-10-CM

## 2020-08-19 DIAGNOSIS — Z170 Estrogen receptor positive status [ER+]: Secondary | ICD-10-CM

## 2020-08-19 DIAGNOSIS — Z5111 Encounter for antineoplastic chemotherapy: Secondary | ICD-10-CM | POA: Diagnosis not present

## 2020-08-19 DIAGNOSIS — Z79899 Other long term (current) drug therapy: Secondary | ICD-10-CM | POA: Diagnosis not present

## 2020-08-19 DIAGNOSIS — C50412 Malignant neoplasm of upper-outer quadrant of left female breast: Secondary | ICD-10-CM

## 2020-08-19 DIAGNOSIS — R945 Abnormal results of liver function studies: Secondary | ICD-10-CM | POA: Diagnosis not present

## 2020-08-19 DIAGNOSIS — Z5189 Encounter for other specified aftercare: Secondary | ICD-10-CM | POA: Diagnosis not present

## 2020-08-19 LAB — COMPREHENSIVE METABOLIC PANEL
ALT: 58 U/L — ABNORMAL HIGH (ref 0–44)
AST: 28 U/L (ref 15–41)
Albumin: 3.8 g/dL (ref 3.5–5.0)
Alkaline Phosphatase: 124 U/L (ref 38–126)
Anion gap: 12 (ref 5–15)
BUN: 16 mg/dL (ref 8–23)
CO2: 24 mmol/L (ref 22–32)
Calcium: 9.4 mg/dL (ref 8.9–10.3)
Chloride: 102 mmol/L (ref 98–111)
Creatinine, Ser: 0.65 mg/dL (ref 0.44–1.00)
GFR, Estimated: >60 mL/min (ref >60)
Glucose, Bld: 139 mg/dL — ABNORMAL HIGH (ref 70–99)
Potassium: 3.7 mmol/L (ref 3.5–5.1)
Sodium: 138 mmol/L (ref 135–145)
Total Bilirubin: 0.8 mg/dL (ref 0.3–1.2)
Total Protein: 7.3 g/dL (ref 6.5–8.1)

## 2020-08-19 LAB — HEPATITIS PANEL, ACUTE
HCV Ab: NONREACTIVE
Hep A IgM: NONREACTIVE
Hep B C IgM: NONREACTIVE
Hepatitis B Surface Ag: NONREACTIVE

## 2020-08-19 LAB — CBC WITH DIFFERENTIAL/PLATELET
Abs Immature Granulocytes: 0.04 10*3/uL (ref 0.00–0.07)
Basophils Absolute: 0 10*3/uL (ref 0.0–0.1)
Basophils Relative: 0 %
Eosinophils Absolute: 0 10*3/uL (ref 0.0–0.5)
Eosinophils Relative: 0 %
HCT: 35.2 % — ABNORMAL LOW (ref 36.0–46.0)
Hemoglobin: 11.5 g/dL — ABNORMAL LOW (ref 12.0–15.0)
Immature Granulocytes: 0 %
Lymphocytes Relative: 26 %
Lymphs Abs: 2.5 10*3/uL (ref 0.7–4.0)
MCH: 20.7 pg — ABNORMAL LOW (ref 26.0–34.0)
MCHC: 32.7 g/dL (ref 30.0–36.0)
MCV: 63.3 fL — ABNORMAL LOW (ref 80.0–100.0)
Monocytes Absolute: 0.7 10*3/uL (ref 0.1–1.0)
Monocytes Relative: 7 %
Neutro Abs: 6.2 10*3/uL (ref 1.7–7.7)
Neutrophils Relative %: 67 %
Platelets: 364 10*3/uL (ref 150–400)
RBC: 5.56 MIL/uL — ABNORMAL HIGH (ref 3.87–5.11)
RDW: 17.3 % — ABNORMAL HIGH (ref 11.5–15.5)
WBC: 9.4 10*3/uL (ref 4.0–10.5)
nRBC: 0 % (ref 0.0–0.2)

## 2020-08-19 MED ORDER — DIPHENHYDRAMINE HCL 25 MG PO CAPS
50.0000 mg | ORAL_CAPSULE | Freq: Once | ORAL | Status: AC
Start: 2020-08-19 — End: 2020-08-19
  Administered 2020-08-19: 50 mg via ORAL
  Filled 2020-08-19: qty 2

## 2020-08-19 MED ORDER — HEPARIN SOD (PORK) LOCK FLUSH 100 UNIT/ML IV SOLN
500.0000 [IU] | Freq: Once | INTRAVENOUS | Status: DC | PRN
  Filled 2020-08-19: qty 5

## 2020-08-19 MED ORDER — SODIUM CHLORIDE 0.9 % IV SOLN
10.0000 mg | Freq: Once | INTRAVENOUS | Status: AC
  Administered 2020-08-19: 10 mg via INTRAVENOUS
  Filled 2020-08-19: qty 10

## 2020-08-19 MED ORDER — SODIUM CHLORIDE 0.9% FLUSH
10.0000 mL | INTRAVENOUS | Status: DC | PRN
  Administered 2020-08-19: 10 mL via INTRAVENOUS
  Filled 2020-08-19: qty 10

## 2020-08-19 MED ORDER — PEGFILGRASTIM 6 MG/0.6ML ~~LOC~~ PSKT
6.0000 mg | PREFILLED_SYRINGE | Freq: Once | SUBCUTANEOUS | Status: AC
Start: 2020-08-19 — End: 2020-08-19
  Administered 2020-08-19: 6 mg via SUBCUTANEOUS
  Filled 2020-08-19: qty 0.6

## 2020-08-19 MED ORDER — SODIUM CHLORIDE 0.9 % IV SOLN
420.0000 mg | Freq: Once | INTRAVENOUS | Status: AC
  Administered 2020-08-19: 420 mg via INTRAVENOUS
  Filled 2020-08-19: qty 14

## 2020-08-19 MED ORDER — HEPARIN SOD (PORK) LOCK FLUSH 100 UNIT/ML IV SOLN
500.0000 [IU] | Freq: Once | INTRAVENOUS | Status: AC
  Administered 2020-08-19: 500 [IU] via INTRAVENOUS
  Filled 2020-08-19: qty 5

## 2020-08-19 MED ORDER — PALONOSETRON HCL INJECTION 0.25 MG/5ML
0.2500 mg | Freq: Once | INTRAVENOUS | Status: AC
  Administered 2020-08-19: 0.25 mg via INTRAVENOUS
  Filled 2020-08-19: qty 5

## 2020-08-19 MED ORDER — SODIUM CHLORIDE 0.9 % IV SOLN
150.0000 mg | Freq: Once | INTRAVENOUS | Status: AC
  Administered 2020-08-19: 150 mg via INTRAVENOUS
  Filled 2020-08-19: qty 150

## 2020-08-19 MED ORDER — TRASTUZUMAB-DKST CHEMO 150 MG IV SOLR
450.0000 mg | Freq: Once | INTRAVENOUS | Status: AC
  Administered 2020-08-19: 450 mg via INTRAVENOUS
  Filled 2020-08-19: qty 21.43

## 2020-08-19 MED ORDER — ACETAMINOPHEN 325 MG PO TABS
650.0000 mg | ORAL_TABLET | Freq: Once | ORAL | Status: AC
  Administered 2020-08-19: 650 mg via ORAL
  Filled 2020-08-19: qty 2

## 2020-08-19 MED ORDER — SODIUM CHLORIDE 0.9 % IV SOLN
580.0000 mg | Freq: Once | INTRAVENOUS | Status: AC
  Administered 2020-08-19: 580 mg via INTRAVENOUS
  Filled 2020-08-19: qty 58

## 2020-08-19 MED ORDER — SODIUM CHLORIDE 0.9 % IV SOLN
Freq: Once | INTRAVENOUS | Status: AC
  Filled 2020-08-19: qty 250

## 2020-08-19 MED ORDER — HEPARIN SOD (PORK) LOCK FLUSH 100 UNIT/ML IV SOLN
INTRAVENOUS | Status: AC
  Filled 2020-08-19: qty 5

## 2020-08-19 MED ORDER — SODIUM CHLORIDE 0.9 % IV SOLN
75.0000 mg/m2 | Freq: Once | INTRAVENOUS | Status: AC
  Administered 2020-08-19: 140 mg via INTRAVENOUS
  Filled 2020-08-19: qty 14

## 2020-08-19 NOTE — Progress Notes (Signed)
Hematology/Oncology Consult note [facility]  Telephone:([phone] [phone]  Patient Care Team: Kirk Ruths, MD as PCP - General (Internal Medicine) Rico Junker, RN as Oncology Nurse Navigator   Name of the patient: Wendy Caldwell  [id number]  1955/07/03   Date of visit: 08/19/20  Diagnosis- left breast cancer clinical prognostic stage IIA cT2 N0 M0 ER weakly positive, PR negative HER2 positive   Chief complaint/ Reason for visit- cycle 2 of TCHP chemotherapy neoadjuvant  Heme/Onc history: Patient is a 65 year old female with a past medical history significant for psoriasis for which she is on Humira.  She self palpated a left breast mass which prompted a bilateral diagnostic mammogram on 06/25/2020.  That showed an irregular hypoechoic mass 2.5 x 1.7 x 1.8 cm at 1 o'clock position 1 cm from the nipple.  3 cm from the nipple were benign cysts.  1 mildly enlarged left axillary lymph node with cortical thickening 4 mm.  Both the breast mass and the lymph node were biopsied.  Left breast biopsy was consistent with invasive mammary carcinoma grade 3 ER weakly +1 to 10%, PR negative and HER2 positive by IHC.    Plan is to proceed with 6 cycles of neoadjuvant TCHP chemotherapy followed by surgery  Interval history-she received cycle 1 of TCHP chemotherapy approximately 4 weeks ago. (07/23/20)  Tolerated well.  She presented last week for cycle 2 but unfortunately had significantly elevated LFTs from previous.  Treatment was held and she was asked to return to clinic in 1 week for re-evaluation.   She had a breast MRI on 08/12/2020 which showed known mass in left retroareolar region and two additional worrisome masses; one was in the left breast (10 mm mass in upper outer left breast) and second was in the right breast (12 mm linear enhancement of retroareolar right breast).  Pathology was negative for malignancy and found to be benign breast tissue  with increased stromal fibrosis and patchy periductal chronic inflammation (Left) and benign breast tissue with increased stromal fibrosis tissue (right).   Today, she reports feeling well.  Denies any new concerns.  She received results of her breast biopsy.  She is anxious to begin cycle 2.  Her vision continues to improve with the use of the OTC eyedrops.  She is holding the supplement for macular degeneration.  Denies any additional dryness in her hands.    ECOG PS- 0 Pain scale- 0   Review of systems- Review of Systems  Constitutional: Negative.  Negative for chills, fever, malaise/fatigue and weight loss.  HENT:  Negative for congestion, ear pain and tinnitus.   Eyes:  Positive for blurred vision (d/t dry eyes). Negative for double vision.  Respiratory: Negative.  Negative for cough, sputum production and shortness of breath.   Cardiovascular: Negative.  Negative for chest pain, palpitations and leg swelling.  Gastrointestinal: Negative.  Negative for abdominal pain, constipation, diarrhea, nausea and vomiting.  Genitourinary:  Negative for dysuria, frequency and urgency.  Musculoskeletal:  Negative for back pain and falls.  Skin: Negative.  Negative for rash.  Neurological: Negative.  Negative for weakness and headaches.  Endo/Heme/Allergies: Negative.  Does not bruise/bleed easily.  Psychiatric/Behavioral: Negative.  Negative for depression. The patient is not nervous/anxious and does not have insomnia.       No Known Allergies   Past Medical History:  Diagnosis Date   Family history of adverse reaction to anesthesia    adopted   GERD (gastroesophageal  reflux disease)      Past Surgical History:  Procedure Laterality Date   BREAST BIOPSY Left 07/05/2020   [place] Bx, Q clip, path pending   BREAST BIOPSY Left 07/05/2020   [place] Bx, Vision, path pending    CARPAL TUNNEL RELEASE Bilateral    CHOLECYSTECTOMY     GANGLION CYST EXCISION     wrist   PORTACATH PLACEMENT Right  07/21/2020   Procedure: INSERTION PORT-A-CATH;  Surgeon: Herbert Pun, MD;  Location: [facility] ORS;  Service: General;  Laterality: Right;   right ovary removal      Social History   Socioeconomic History   Marital status: Married    Spouse name: Not on file   Number of children: Not on file   Years of education: Not on file   Highest education level: Not on file  Occupational History   Not on file  Tobacco Use   Smoking status: Former    Pack years: 0.00    Types: Cigarettes    Start date: 2012   Smokeless tobacco: Never  Vaping Use   Vaping Use: Never used  Substance and Sexual Activity   Alcohol use: Yes    Comment: occassional    Drug use: Never   Sexual activity: Not on file  Other Topics Concern   Not on file  Social History Narrative   Not on file   Social Determinants of Health   Financial Resource Strain: Not on file  Food Insecurity: Not on file  Transportation Needs: Not on file  Physical Activity: Not on file  Stress: Not on file  Social Connections: Not on file  Intimate Partner Violence: Not on file    Family History  Adopted: Yes  Problem Relation Age of Onset   Breast cancer Neg Hx      Current Outpatient Medications:    dexamethasone (DECADRON) 4 MG tablet, Take 2 tablets (8 mg total) by mouth 2 (two) times daily. Start the day before Taxotere. Then take daily x 3 days after chemotherapy., Disp: 30 tablet, Rfl: 1   lidocaine-prilocaine (EMLA) cream, Apply to affected area once (Patient taking differently: Apply 1 application topically daily as needed (prior to port access).), Disp: 30 g, Rfl: 3   loratadine (CLARITIN) 10 MG tablet, Take 10 mg by mouth daily., Disp: , Rfl:    LORazepam (ATIVAN) 0.5 MG tablet, Take 1 tablet (0.5 mg total) by mouth every 6 (six) hours as needed (Nausea or vomiting)., Disp: 30 tablet, Rfl: 0   omeprazole (PRILOSEC) 40 MG capsule, Take 40 mg by mouth daily., Disp: , Rfl:    ondansetron (ZOFRAN) 8 MG tablet,  Take 1 tablet (8 mg total) by mouth 2 (two) times daily as needed (Nausea or vomiting). Start on the third day after chemotherapy., Disp: 30 tablet, Rfl: 1   prochlorperazine (COMPAZINE) 10 MG tablet, Take 1 tablet (10 mg total) by mouth every 6 (six) hours as needed (Nausea or vomiting)., Disp: 30 tablet, Rfl: 1   triamcinolone ointment (KENALOG) 0.5 %, Apply 1 application topically 2 (two) times daily., Disp: 30 g, Rfl: 0   vitamin B-12 (CYANOCOBALAMIN) 1000 MCG tablet, Take 1,000 mcg by mouth daily., Disp: , Rfl:    vitamin C (ASCORBIC ACID) 500 MG tablet, Take 500 mg by mouth daily., Disp: , Rfl:    Adalimumab (HUMIRA) 40 MG/0.8ML PSKT, Inject 40 mg into the skin every 14 (fourteen) days., Disp: , Rfl:  No current facility-administered medications for this visit.  Facility-Administered Medications Ordered  in Other Visits:    CARBOplatin (PARAPLATIN) 580 mg in sodium chloride 0.9 % 250 mL chemo infusion, 580 mg, Intravenous, Once, Sindy Guadeloupe, MD   DOCEtaxel (TAXOTERE) 140 mg in sodium chloride 0.9 % 250 mL chemo infusion, 75 mg/m2 (Order-Specific), Intravenous, Once, Sindy Guadeloupe, MD, Last Rate: 264 mL/hr at 08/19/20 1209, 140 mg at 08/19/20 1209   heparin lock flush 100 unit/mL, 500 Units, Intravenous, Once, Sindy Guadeloupe, MD   heparin lock flush 100 unit/mL, 500 Units, Intracatheter, Once PRN, Sindy Guadeloupe, MD   pegfilgrastim (NEULASTA ONPRO KIT) injection 6 mg, 6 mg, Subcutaneous, Once, Sindy Guadeloupe, MD   sodium chloride flush (NS) 0.9 % injection 10 mL, 10 mL, Intravenous, PRN, Sindy Guadeloupe, MD, 10 mL at 08/19/20 0822  Physical exam:  Vitals:   08/19/20 0849  BP: 135/76  Pulse: 95  Resp: 20  Temp: 97.8 F (36.6 C)  SpO2: 99%  Weight: 177 lb 4.8 oz (80.4 kg)   Physical Exam Constitutional:      Appearance: Normal appearance.  HENT:     Head: Normocephalic and atraumatic.  Eyes:     Pupils: Pupils are equal, round, and reactive to light.  Cardiovascular:     Rate  and Rhythm: Normal rate and regular rhythm.     Heart sounds: Normal heart sounds. No murmur heard. Pulmonary:     Effort: Pulmonary effort is normal.     Breath sounds: Normal breath sounds. No wheezing.  Abdominal:     General: Bowel sounds are normal. There is no distension.     Palpations: Abdomen is soft.     Tenderness: There is no abdominal tenderness.  Musculoskeletal:        General: Normal range of motion.     Cervical back: Normal range of motion.  Skin:    General: Skin is warm and dry.     Findings: No rash.  Neurological:     Mental Status: She is alert and oriented to person, place, and time.  Psychiatric:        Judgment: Judgment normal.     CMP Latest Ref Rng & Units 08/19/2020  Glucose 70 - 99 mg/dL 139(H)  BUN 8 - 23 mg/dL 16  Creatinine 0.44 - 1.00 mg/dL 0.65  Sodium 135 - 145 mmol/L 138  Potassium 3.5 - 5.1 mmol/L 3.7  Chloride 98 - 111 mmol/L 102  CO2 22 - 32 mmol/L 24  Calcium 8.9 - 10.3 mg/dL 9.4  Total Protein 6.5 - 8.1 g/dL 7.3  Total Bilirubin 0.3 - 1.2 mg/dL 0.8  Alkaline Phos 38 - 126 U/L 124  AST 15 - 41 U/L 28  ALT 0 - 44 U/L 58(H)   CBC Latest Ref Rng & Units 08/19/2020  WBC 4.0 - 10.5 K/uL 9.4  Hemoglobin 12.0 - 15.0 g/dL 11.5(L)  Hematocrit 36.0 - 46.0 % 35.2(L)  Platelets 150 - 400 K/uL 364    No images are attached to the encounter.  MR BREAST BILATERAL W WO CONTRAST INC CAD  Result Date: 08/05/2020 CLINICAL DATA:  Recently diagnosed left invasive mammary carcinoma in the retroareolar region. LABS:  None EXAM: BILATERAL BREAST MRI WITH AND WITHOUT CONTRAST TECHNIQUE: Multiplanar, multisequence MR images of both breasts were obtained prior to and following the intravenous administration of 8 ml of Gadavist Three-dimensional MR images were rendered by post-processing of the original MR data on an independent workstation. The three-dimensional MR images were interpreted, and findings are reported in the  following complete MRI report for  this study. Three dimensional images were evaluated at the independent interpreting workstation using the DynaCAD thin client. COMPARISON:  Recent mammography FINDINGS: Due to motion, the images in PACS are of poor quality. The motion corrected DynaCAD images are of much higher quality. Images of pertinent findings in both breasts were captured on DynaCAD and sent to PACS. Breast composition: c. Heterogeneous fibroglandular tissue. Background parenchymal enhancement: Mild Right breast: There is linear enhancement in the right breast located in a retroareolar region at an anterior to mid depth spanning 12.4 mm. No other suspicious findings are seen in the right breast. A Port-A-Cath is identified in the upper inner right breast. Left breast: The patient's known malignancy in the left retroareolar region measures 18 x 22 by 19 mm. There appears to be nipple invasion. Another mass is seen in the upper slightly outer left breast measuring 10 x 6 x 7 mm. No other suspicious findings are identified in the left breast. Lymph nodes: No abnormal appearing lymph nodes. Ancillary findings:  None. IMPRESSION: 1. The patient's known malignancy in the left retroareolar region appears to invade at least the base of the nipple. In total, this mass measures approximately 18 x 22 x 19 mm. 2. A second mass in the upper slightly outer left breast measures 10 x 6 x 7 mm. 3. 12.4 mm of linear enhancement is seen in the right retroareolar region at an anterior to mid depth. RECOMMENDATION: If breast conservation is being considered, recommend biopsying the second mass in the upper slightly outer left breast measuring 10 x 6 x 7 mm. This biopsy is not necessary if the patient is proceeding to mastectomy. Recommend MRI guided biopsy of the linear enhancement in the right retroareolar region spanning 12.4 mm. BI-RADS CATEGORY  4: Suspicious. Electronically Signed   By: Dorise Bullion III M.D   On: 08/05/2020 12:32   DG Chest Port 1  View  Result Date: 07/21/2020 CLINICAL DATA:  Port-A-Cath insertion EXAM: PORTABLE CHEST 1 VIEW COMPARISON:  None. FINDINGS: Right-sided central venous port tip over the SVC origin. No right pneumothorax. No consolidation or effusion. Coarse interstitial opacity which could be secondary to bronchitic changes. Normal heart size. IMPRESSION: 1. Right-sided central venous port tip over the SVC origin. No pneumothorax 2. Bronchitic changes Electronically Signed   By: Donavan Foil M.D.   On: 07/21/2020 17:55   DG C-Arm 1-60 Min-No Report  Result Date: 07/21/2020 Fluoroscopy was utilized by the requesting physician.  No radiographic interpretation.   MM CLIP PLACEMENT LEFT  Result Date: 08/12/2020 CLINICAL DATA:  Post MRI guided biopsy of an enhancing mass in the upper-outer left breast and MRI guided biopsy of linear enhancement in the retroareolar right breast. EXAM: DIAGNOSTIC BILATERAL MAMMOGRAM POST MRI BIOPSY COMPARISON:  Previous exams. FINDINGS: Mammographic images were obtained following MRI guided biopsy of an enhancing mass in the upper-outer left breast and linear enhancement in the retroareolar right breast. A dumbbell shaped biopsy marking clip is present at the site of the biopsied enhancing mass in the upper-outer left breast. A dumbbell shaped biopsy marking clip is present in the retroareolar right breast at site of biopsied linear enhancement. IMPRESSION: 1. Dumbbell shaped biopsy marking clip at site of biopsied enhancing mass in the upper-outer left breast. 2. Dumbbell shaped biopsy marking clip at site of biopsy linear enhancement in the retroareolar right breast. Final Assessment: Post Procedure Mammograms for Marker Placement Electronically Signed   By: Lily Lovings.D.  On: 08/12/2020 10:28   MM CLIP PLACEMENT RIGHT  Result Date: 08/12/2020 CLINICAL DATA:  Post MRI guided biopsy of an enhancing mass in the upper-outer left breast and MRI guided biopsy of linear enhancement in  the retroareolar right breast. EXAM: DIAGNOSTIC BILATERAL MAMMOGRAM POST MRI BIOPSY COMPARISON:  Previous exams. FINDINGS: Mammographic images were obtained following MRI guided biopsy of an enhancing mass in the upper-outer left breast and linear enhancement in the retroareolar right breast. A dumbbell shaped biopsy marking clip is present at the site of the biopsied enhancing mass in the upper-outer left breast. A dumbbell shaped biopsy marking clip is present in the retroareolar right breast at site of biopsied linear enhancement. IMPRESSION: 1. Dumbbell shaped biopsy marking clip at site of biopsied enhancing mass in the upper-outer left breast. 2. Dumbbell shaped biopsy marking clip at site of biopsy linear enhancement in the retroareolar right breast. Final Assessment: Post Procedure Mammograms for Marker Placement Electronically Signed   By: Everlean Alstrom M.D.   On: 08/12/2020 10:28   MR LT BREAST BX W LOC DEV 1ST LESION IMAGE BX SPEC MR GUIDE  Addendum Date: 08/17/2020   ADDENDUM REPORT: 08/16/2020 08:27 ADDENDUM: Pathology revealed BENIGN BREAST TISSUE WITH INCREASED STROMAL FIBROSIS AND PATCHY PERIDUCTULAR CHRONIC INFLAMMATION of the LEFT breast, upper outer quadrant. This was found to be concordant by Dr. Everlean Alstrom. Pathology revealed BENIGN BREAST TISSUE WITH INCREASED STROMAL FIBROSIS of the RIGHT breast, retroareolar. This was found to be concordant by Dr. Everlean Alstrom. Pathology results were discussed with the patient by telephone. The patient reported doing well after the biopsies with tenderness at the sites. Post biopsy instructions and care were reviewed and questions were answered. The patient was encouraged to call [facility] for any additional concerns. The patient has a recent diagnosis of LEFT breast cancer and should follow her outlined treatment plan. Pathology results reported by Terie Purser, RN on 08/16/2020. Electronically Signed   By: Everlean Alstrom M.D.   On: 08/16/2020 08:27   Result Date: 08/17/2020 CLINICAL DATA:  65 year old female with recent diagnosis of invasive mammary carcinoma in the retroareolar left breast presents for MRI guided biopsy a second 10 mm mass in the upper slightly outer left breast and and a 12 mm area of linear enhancement in the retroareolar right breast anterior to mid depth. EXAM: MRI GUIDED CORE NEEDLE BIOPSY OF THE BILATERAL BREASTS TECHNIQUE: Multiplanar, multisequence MR imaging of the bilateral breasts was performed both before and after administration of intravenous contrast. CONTRAST:  8 mL Gadavist. COMPARISON:  Previous exams. FINDINGS: I met with the patient, and we discussed the procedure of MRI guided biopsy, including risks, benefits, and alternatives. Specifically, we discussed the risks of infection, bleeding, tissue injury, clip migration, and inadequate sampling. Informed, written consent was given. The usual time out protocol was performed immediately prior to the procedure. SITE 1 LEFT BREAST UPPER OUTER 10 MM ENHANCING MASS: Using sterile technique, 1% Lidocaine, MRI guidance, and a 9 gauge vacuum assisted device, biopsy was performed of the enhancing mass in the upper-outer left breast using a lateral to medial approach. At the conclusion of the procedure, a dumbbell shaped tissue marker clip was deployed into the biopsy cavity. Follow-up 2-view mammogram was performed and dictated separately. SITE 2 RIGHT BREAST RETROAREOLAR 12 MM LINEAR ENHANCEMENT: Using sterile technique, 1% Lidocaine, MRI guidance, and a 9 gauge vacuum assisted device, biopsy was performed of the linear enhancement in the retroareolar right breast using a  lateral to medial approach. At the conclusion of the procedure, a dumbbell shaped tissue marker clip was deployed into the biopsy cavity. Follow-up 2-view mammogram was performed and dictated separately. IMPRESSION: 1. MRI guided biopsy of the enhancing mass in the upper-outer  left breast. 2. MRI guided biopsy of the linear enhancement in the retroareolar right breast. Electronically Signed: By: Everlean Alstrom M.D. On: 08/12/2020 10:05   MR RT BREAST BX W LOC DEV 1ST LESION IMAGE BX SPEC MR GUIDE  Addendum Date: 08/17/2020   ADDENDUM REPORT: 08/16/2020 08:27 ADDENDUM: Pathology revealed BENIGN BREAST TISSUE WITH INCREASED STROMAL FIBROSIS AND PATCHY PERIDUCTULAR CHRONIC INFLAMMATION of the LEFT breast, upper outer quadrant. This was found to be concordant by Dr. Everlean Alstrom. Pathology revealed BENIGN BREAST TISSUE WITH INCREASED STROMAL FIBROSIS of the RIGHT breast, retroareolar. This was found to be concordant by Dr. Everlean Alstrom. Pathology results were discussed with the patient by telephone. The patient reported doing well after the biopsies with tenderness at the sites. Post biopsy instructions and care were reviewed and questions were answered. The patient was encouraged to call [facility] for any additional concerns. The patient has a recent diagnosis of LEFT breast cancer and should follow her outlined treatment plan. Pathology results reported by Terie Purser, RN on 08/16/2020. Electronically Signed   By: Everlean Alstrom M.D.   On: 08/16/2020 08:27   Result Date: 08/17/2020 CLINICAL DATA:  65 year old female with recent diagnosis of invasive mammary carcinoma in the retroareolar left breast presents for MRI guided biopsy a second 10 mm mass in the upper slightly outer left breast and and a 12 mm area of linear enhancement in the retroareolar right breast anterior to mid depth. EXAM: MRI GUIDED CORE NEEDLE BIOPSY OF THE BILATERAL BREASTS TECHNIQUE: Multiplanar, multisequence MR imaging of the bilateral breasts was performed both before and after administration of intravenous contrast. CONTRAST:  8 mL Gadavist. COMPARISON:  Previous exams. FINDINGS: I met with the patient, and we discussed the procedure of MRI guided biopsy, including risks,  benefits, and alternatives. Specifically, we discussed the risks of infection, bleeding, tissue injury, clip migration, and inadequate sampling. Informed, written consent was given. The usual time out protocol was performed immediately prior to the procedure. SITE 1 LEFT BREAST UPPER OUTER 10 MM ENHANCING MASS: Using sterile technique, 1% Lidocaine, MRI guidance, and a 9 gauge vacuum assisted device, biopsy was performed of the enhancing mass in the upper-outer left breast using a lateral to medial approach. At the conclusion of the procedure, a dumbbell shaped tissue marker clip was deployed into the biopsy cavity. Follow-up 2-view mammogram was performed and dictated separately. SITE 2 RIGHT BREAST RETROAREOLAR 12 MM LINEAR ENHANCEMENT: Using sterile technique, 1% Lidocaine, MRI guidance, and a 9 gauge vacuum assisted device, biopsy was performed of the linear enhancement in the retroareolar right breast using a lateral to medial approach. At the conclusion of the procedure, a dumbbell shaped tissue marker clip was deployed into the biopsy cavity. Follow-up 2-view mammogram was performed and dictated separately. IMPRESSION: 1. MRI guided biopsy of the enhancing mass in the upper-outer left breast. 2. MRI guided biopsy of the linear enhancement in the retroareolar right breast. Electronically Signed: By: Everlean Alstrom M.D. On: 08/12/2020 10:05     Assessment and plan- Patient is a 65 y.o. female  with clinical prognostic stage IIa invasive mammary carcinoma of the left breast cT2 N0 M0 ER weakly +1 to 10%, PR negative and HER2 positive.  She  is here reassessment prior to cycle 2 of neoadjuvant TCHP chemotherapy.  She tolerated cycle 1 well.  Reports essentially no side effects.  Labs from 08/19/20 show improvement overall of her LFTs. Spoke with Dr. Donzetta Matters who recommends hepatitis work-up. All other labs are stable. Okay to proceed with cycle 2.   Reviewed recent MRI and results of biopsy. Both  biopsies were negative for malignancy.   RTC in 3 week to repeat lab work (CBC, CMP), NP assessment and possible treatment.   Will call with results of hepatitis panel.  Greater than 50% was spent in counseling and coordination of care with this patient including but not limited to discussion of the relevant topics above (See A&P) including, but not limited to diagnosis and management of acute and chronic medical conditions.   Visit Diagnosis 1. Abnormal LFTs   2. Malignant neoplasm of upper-outer quadrant of left breast in female, estrogen receptor positive (Standard)     Faythe Casa, NP 08/19/2020 12:41 PM  CC: Dr. Janese Banks

## 2020-08-19 NOTE — Progress Notes (Signed)
Adjusting dose per MD for change in weight and SCr

## 2020-08-19 NOTE — Patient Instructions (Signed)
[facility]   Discharge Instructions: Thank you for choosing [facility] to provide your oncology and hematology care.  If you have a lab appointment with the [facility], please go directly to the [facility] and check in at the registration area.  Wear comfortable clothing and clothing appropriate for easy access to any Portacath or PICC line.   We strive to give you quality time with your provider. You may need to reschedule your appointment if you arrive late (15 or more minutes).  Arriving late affects you and other patients whose appointments are after yours.  Also, if you miss three or more appointments without notifying the office, you may be dismissed from the clinic at the provider's discretion.      For prescription refill requests, have your pharmacy contact our office and allow 72 hours for refills to be completed.    Today you received the following chemotherapy and/or immunotherapy agents Trastuzumab, Perjeta, taxotere, carboplatin       To help prevent nausea and vomiting after your treatment, we encourage you to take your nausea medication as directed.  BELOW ARE SYMPTOMS THAT SHOULD BE REPORTED IMMEDIATELY: *FEVER GREATER THAN 100.4 F (38 C) OR HIGHER *CHILLS OR SWEATING *NAUSEA AND VOMITING THAT IS NOT CONTROLLED WITH YOUR NAUSEA MEDICATION *UNUSUAL SHORTNESS OF BREATH *UNUSUAL BRUISING OR BLEEDING *URINARY PROBLEMS (pain or burning when urinating, or frequent urination) *BOWEL PROBLEMS (unusual diarrhea, constipation, pain near the anus) TENDERNESS IN MOUTH AND THROAT WITH OR WITHOUT PRESENCE OF ULCERS (sore throat, sores in mouth, or a toothache) UNUSUAL RASH, SWELLING OR PAIN  UNUSUAL VAGINAL DISCHARGE OR ITCHING   Items with * indicate a potential emergency and should be followed up as soon as possible or go to the Emergency Department if any problems should occur.  Please show the CHEMOTHERAPY ALERT CARD or  IMMUNOTHERAPY ALERT CARD at check-in to the Emergency Department and triage nurse.  Should you have questions after your visit or need to cancel or reschedule your appointment, please contact [facility]  [phone] and follow the prompts.  Office hours are 8:00 a.m. to 4:30 p.m. Monday - Friday. Please note that voicemails left after 4:00 p.m. may not be returned until the following business day.  We are closed weekends and major holidays. You have access to a nurse at all times for urgent questions. Please call the main number to the clinic [phone] and follow the prompts.  For any non-urgent questions, you may also contact your provider using MyChart. We now offer e-Visits for anyone 63 and older to request care online for non-urgent symptoms. For details visit [email or web address].   Also download the MyChart app! Go to the app store, search "MyChart", open the app, select [facility], and log in with your MyChart username and password.  Due to Covid, a mask is required upon entering the hospital/clinic. If you do not have a mask, one will be given to you upon arrival. For doctor visits, patients may have 1 support person aged 26 or older with them. For treatment visits, patients cannot have anyone with them due to current Covid guidelines and our immunocompromised population.   Carboplatin injection What is this medicine? CARBOPLATIN (KAR boe pla tin) is a chemotherapy drug. It targets fast dividing cells, like cancer cells, and causes these cells to die. This medicine is used to treat ovarian cancer and many other cancers. This medicine may be used for other purposes; ask  your health care provider or pharmacist if you have questions. COMMON BRAND NAME(S): Paraplatin What should I tell my health care provider before I take this medicine? They need to know if you have any of these conditions: blood disorders hearing problems kidney disease recent  or ongoing radiation therapy an unusual or allergic reaction to carboplatin, cisplatin, other chemotherapy, other medicines, foods, dyes, or preservatives pregnant or trying to get pregnant breast-feeding How should I use this medicine? This drug is usually given as an infusion into a vein. It is administered in a hospital or clinic by a specially trained health care professional. Talk to your pediatrician regarding the use of this medicine in children. Special care may be needed. Overdosage: If you think you have taken too much of this medicine contact a poison control center or emergency room at once. NOTE: This medicine is only for you. Do not share this medicine with others. What if I miss a dose? It is important not to miss a dose. Call your doctor or health care professional if you are unable to keep an appointment. What may interact with this medicine? medicines for seizures medicines to increase blood counts like filgrastim, pegfilgrastim, sargramostim some antibiotics like amikacin, gentamicin, neomycin, streptomycin, tobramycin vaccines Talk to your doctor or health care professional before taking any of these medicines: acetaminophen aspirin ibuprofen ketoprofen naproxen This list may not describe all possible interactions. Give your health care provider a list of all the medicines, herbs, non-prescription drugs, or dietary supplements you use. Also tell them if you smoke, drink alcohol, or use illegal drugs. Some items may interact with your medicine. What should I watch for while using this medicine? Your condition will be monitored carefully while you are receiving this medicine. You will need important blood work done while you are taking this medicine. This drug may make you feel generally unwell. This is not uncommon, as chemotherapy can affect healthy cells as well as cancer cells. Report any side effects. Continue your course of treatment even though you feel ill unless  your doctor tells you to stop. In some cases, you may be given additional medicines to help with side effects. Follow all directions for their use. Call your doctor or health care professional for advice if you get a fever, chills or sore throat, or other symptoms of a cold or flu. Do not treat yourself. This drug decreases your body's ability to fight infections. Try to avoid being around people who are sick. This medicine may increase your risk to bruise or bleed. Call your doctor or health care professional if you notice any unusual bleeding. Be careful brushing and flossing your teeth or using a toothpick because you may get an infection or bleed more easily. If you have any dental work done, tell your dentist you are receiving this medicine. Avoid taking products that contain aspirin, acetaminophen, ibuprofen, naproxen, or ketoprofen unless instructed by your doctor. These medicines may hide a fever. Do not become pregnant while taking this medicine. Women should inform their doctor if they wish to become pregnant or think they might be pregnant. There is a potential for serious side effects to an unborn child. Talk to your health care professional or pharmacist for more information. Do not breast-feed an infant while taking this medicine. What side effects may I notice from receiving this medicine? Side effects that you should report to your doctor or health care professional as soon as possible: allergic reactions like skin rash, itching or hives, swelling  of the face, lips, or tongue signs of infection - fever or chills, cough, sore throat, pain or difficulty passing urine signs of decreased platelets or bleeding - bruising, pinpoint red spots on the skin, black, tarry stools, nosebleeds signs of decreased red blood cells - unusually weak or tired, fainting spells, lightheadedness breathing problems changes in hearing changes in vision chest pain high blood pressure low blood counts - This  drug may decrease the number of white blood cells, red blood cells and platelets. You may be at increased risk for infections and bleeding. nausea and vomiting pain, swelling, redness or irritation at the injection site pain, tingling, numbness in the hands or feet problems with balance, talking, walking trouble passing urine or change in the amount of urine Side effects that usually do not require medical attention (report to your doctor or health care professional if they continue or are bothersome): hair loss loss of appetite metallic taste in the mouth or changes in taste This list may not describe all possible side effects. Call your doctor for medical advice about side effects. You may report side effects to FDA at 1-800-FDA-1088. Where should I keep my medicine? This drug is given in a hospital or clinic and will not be stored at home. NOTE: This sheet is a summary. It may not cover all possible information. If you have questions about this medicine, talk to your doctor, pharmacist, or health care provider.  2021 Elsevier/Gold Standard (2007-06-04 14:38:05) Docetaxel injection What is this medicine? DOCETAXEL (doe se TAX el) is a chemotherapy drug. It targets fast dividing cells, like cancer cells, and causes these cells to die. This medicine is used to treat many types of cancers like breast cancer, certain stomach cancers, head and neck cancer, lung cancer, and prostate cancer. This medicine may be used for other purposes; ask your health care provider or pharmacist if you have questions. COMMON BRAND NAME(S): Docefrez, Taxotere What should I tell my health care provider before I take this medicine? They need to know if you have any of these conditions: infection (especially a virus infection such as chickenpox, cold sores, or herpes) liver disease low blood counts, like low white cell, platelet, or red cell counts an unusual or allergic reaction to docetaxel, polysorbate 80, other  chemotherapy agents, other medicines, foods, dyes, or preservatives pregnant or trying to get pregnant breast-feeding How should I use this medicine? This drug is given as an infusion into a vein. It is administered in a hospital or clinic by a specially trained health care professional. Talk to your pediatrician regarding the use of this medicine in children. Special care may be needed. Overdosage: If you think you have taken too much of this medicine contact a poison control center or emergency room at once. NOTE: This medicine is only for you. Do not share this medicine with others. What if I miss a dose? It is important not to miss your dose. Call your doctor or health care professional if you are unable to keep an appointment. What may interact with this medicine? Do not take this medicine with any of the following medications: live virus vaccines This medicine may also interact with the following medications: aprepitant certain antibiotics like erythromycin or clarithromycin certain antivirals for HIV or hepatitis certain medicines for fungal infections like fluconazole, itraconazole, ketoconazole, posaconazole, or voriconazole cimetidine ciprofloxacin conivaptan cyclosporine dronedarone fluvoxamine grapefruit juice imatinib verapamil This list may not describe all possible interactions. Give your health care provider a list of all  the medicines, herbs, non-prescription drugs, or dietary supplements you use. Also tell them if you smoke, drink alcohol, or use illegal drugs. Some items may interact with your medicine. What should I watch for while using this medicine? Your condition will be monitored carefully while you are receiving this medicine. You will need important blood work done while you are taking this medicine. Call your doctor or health care professional for advice if you get a fever, chills or sore throat, or other symptoms of a cold or flu. Do not treat yourself. This  drug decreases your body's ability to fight infections. Try to avoid being around people who are sick. Some products may contain alcohol. Ask your health care professional if this medicine contains alcohol. Be sure to tell all health care professionals you are taking this medicine. Certain medicines, like metronidazole and disulfiram, can cause an unpleasant reaction when taken with alcohol. The reaction includes flushing, headache, nausea, vomiting, sweating, and increased thirst. The reaction can last from 30 minutes to several hours. You may get drowsy or dizzy. Do not drive, use machinery, or do anything that needs mental alertness until you know how this medicine affects you. Do not stand or sit up quickly, especially if you are an older patient. This reduces the risk of dizzy or fainting spells. Alcohol may interfere with the effect of this medicine. Talk to your health care professional about your risk of cancer. You may be more at risk for certain types of cancer if you take this medicine. Do not become pregnant while taking this medicine or for 6 months after stopping it. Women should inform their doctor if they wish to become pregnant or think they might be pregnant. There is a potential for serious side effects to an unborn child. Talk to your health care professional or pharmacist for more information. Do not breast-feed an infant while taking this medicine or for 1 week after stopping it. Males who get this medicine must use a condom during sex with females who can get pregnant. If you get a woman pregnant, the baby could have birth defects. The baby could die before they are born. You will need to continue wearing a condom for 3 months after stopping the medicine. Tell your health care provider right away if your partner becomes pregnant while you are taking this medicine. This may interfere with the ability to father a child. You should talk to your doctor or health care professional if you are  concerned about your fertility. What side effects may I notice from receiving this medicine? Side effects that you should report to your doctor or health care professional as soon as possible: allergic reactions like skin rash, itching or hives, swelling of the face, lips, or tongue blurred vision breathing problems changes in vision low blood counts - This drug may decrease the number of white blood cells, red blood cells and platelets. You may be at increased risk for infections and bleeding. nausea and vomiting pain, redness or irritation at site where injected pain, tingling, numbness in the hands or feet redness, blistering, peeling, or loosening of the skin, including inside the mouth signs of decreased platelets or bleeding - bruising, pinpoint red spots on the skin, black, tarry stools, nosebleeds signs of decreased red blood cells - unusually weak or tired, fainting spells, lightheadedness signs of infection - fever or chills, cough, sore throat, pain or difficulty passing urine swelling of the ankle, feet, hands Side effects that usually do not require medical  attention (report to your doctor or health care professional if they continue or are bothersome): constipation diarrhea fingernail or toenail changes hair loss loss of appetite mouth sores muscle pain This list may not describe all possible side effects. Call your doctor for medical advice about side effects. You may report side effects to FDA at 1-800-FDA-1088. Where should I keep my medicine? This drug is given in a hospital or clinic and will not be stored at home. NOTE: This sheet is a summary. It may not cover all possible information. If you have questions about this medicine, talk to your doctor, pharmacist, or health care provider.  2021 Elsevier/Gold Standard (2019-01-27 19:50:31) Pertuzumab injection What is this medicine? PERTUZUMAB (per TOOZ ue mab) is a monoclonal antibody. It is used to treat breast  cancer. This medicine may be used for other purposes; ask your health care provider or pharmacist if you have questions. COMMON BRAND NAME(S): PERJETA What should I tell my health care provider before I take this medicine? They need to know if you have any of these conditions: heart disease heart failure high blood pressure history of irregular heart beat recent or ongoing radiation therapy an unusual or allergic reaction to pertuzumab, other medicines, foods, dyes, or preservatives pregnant or trying to get pregnant breast-feeding How should I use this medicine? This medicine is for infusion into a vein. It is given by a health care professional in a hospital or clinic setting. Talk to your pediatrician regarding the use of this medicine in children. Special care may be needed. Overdosage: If you think you have taken too much of this medicine contact a poison control center or emergency room at once. NOTE: This medicine is only for you. Do not share this medicine with others. What if I miss a dose? It is important not to miss your dose. Call your doctor or health care professional if you are unable to keep an appointment. What may interact with this medicine? Interactions are not expected. Give your health care provider a list of all the medicines, herbs, non-prescription drugs, or dietary supplements you use. Also tell them if you smoke, drink alcohol, or use illegal drugs. Some items may interact with your medicine. This list may not describe all possible interactions. Give your health care provider a list of all the medicines, herbs, non-prescription drugs, or dietary supplements you use. Also tell them if you smoke, drink alcohol, or use illegal drugs. Some items may interact with your medicine. What should I watch for while using this medicine? Your condition will be monitored carefully while you are receiving this medicine. Report any side effects. Continue your course of treatment  even though you feel ill unless your doctor tells you to stop. Do not become pregnant while taking this medicine or for 7 months after stopping it. Women should inform their doctor if they wish to become pregnant or think they might be pregnant. Women of child-bearing potential will need to have a negative pregnancy test before starting this medicine. There is a potential for serious side effects to an unborn child. Talk to your health care professional or pharmacist for more information. Do not breast-feed an infant while taking this medicine or for 7 months after stopping it. Women must use effective birth control with this medicine. Call your doctor or health care professional for advice if you get a fever, chills or sore throat, or other symptoms of a cold or flu. Do not treat yourself. Try to avoid being around people  who are sick. You may experience fever, chills, and headache during the infusion. Report any side effects during the infusion to your health care professional. What side effects may I notice from receiving this medicine? Side effects that you should report to your doctor or health care professional as soon as possible: breathing problems chest pain or palpitations dizziness feeling faint or lightheaded fever or chills skin rash, itching or hives sore throat swelling of the face, lips, or tongue swelling of the legs or ankles unusually weak or tired Side effects that usually do not require medical attention (report to your doctor or health care professional if they continue or are bothersome): diarrhea hair loss nausea, vomiting tiredness This list may not describe all possible side effects. Call your doctor for medical advice about side effects. You may report side effects to FDA at 1-800-FDA-1088. Where should I keep my medicine? This drug is given in a hospital or clinic and will not be stored at home. NOTE: This sheet is a summary. It may not cover all possible  information. If you have questions about this medicine, talk to your doctor, pharmacist, or health care provider.  2021 Elsevier/Gold Standard (2015-04-01 12:08:50) Trastuzumab injection for infusion What is this medicine? TRASTUZUMAB (tras TOO zoo mab) is a monoclonal antibody. It is used to treat breast cancer and stomach cancer. This medicine may be used for other purposes; ask your health care provider or pharmacist if you have questions. COMMON BRAND NAME(S): Herceptin, Galvin Proffer, Trazimera What should I tell my health care provider before I take this medicine? They need to know if you have any of these conditions: heart disease heart failure lung or breathing disease, like asthma an unusual or allergic reaction to trastuzumab, benzyl alcohol, or other medications, foods, dyes, or preservatives pregnant or trying to get pregnant breast-feeding How should I use this medicine? This drug is given as an infusion into a vein. It is administered in a hospital or clinic by a specially trained health care professional. Talk to your pediatrician regarding the use of this medicine in children. This medicine is not approved for use in children. Overdosage: If you think you have taken too much of this medicine contact a poison control center or emergency room at once. NOTE: This medicine is only for you. Do not share this medicine with others. What if I miss a dose? It is important not to miss a dose. Call your doctor or health care professional if you are unable to keep an appointment. What may interact with this medicine? This medicine may interact with the following medications: certain types of chemotherapy, such as daunorubicin, doxorubicin, epirubicin, and idarubicin This list may not describe all possible interactions. Give your health care provider a list of all the medicines, herbs, non-prescription drugs, or dietary supplements you use. Also tell them if you  smoke, drink alcohol, or use illegal drugs. Some items may interact with your medicine. What should I watch for while using this medicine? Visit your doctor for checks on your progress. Report any side effects. Continue your course of treatment even though you feel ill unless your doctor tells you to stop. Call your doctor or health care professional for advice if you get a fever, chills or sore throat, or other symptoms of a cold or flu. Do not treat yourself. Try to avoid being around people who are sick. You may experience fever, chills and shaking during your first infusion. These effects are usually mild and can  be treated with other medicines. Report any side effects during the infusion to your health care professional. Fever and chills usually do not happen with later infusions. Do not become pregnant while taking this medicine or for 7 months after stopping it. Women should inform their doctor if they wish to become pregnant or think they might be pregnant. Women of child-bearing potential will need to have a negative pregnancy test before starting this medicine. There is a potential for serious side effects to an unborn child. Talk to your health care professional or pharmacist for more information. Do not breast-feed an infant while taking this medicine or for 7 months after stopping it. Women must use effective birth control with this medicine. What side effects may I notice from receiving this medicine? Side effects that you should report to your doctor or health care professional as soon as possible: allergic reactions like skin rash, itching or hives, swelling of the face, lips, or tongue chest pain or palpitations cough dizziness feeling faint or lightheaded, falls fever general ill feeling or flu-like symptoms signs of worsening heart failure like breathing problems; swelling in your legs and feet unusually weak or tired Side effects that usually do not require medical attention  (report to your doctor or health care professional if they continue or are bothersome): bone pain changes in taste diarrhea joint pain nausea/vomiting weight loss This list may not describe all possible side effects. Call your doctor for medical advice about side effects. You may report side effects to FDA at 1-800-FDA-1088. Where should I keep my medicine? This drug is given in a hospital or clinic and will not be stored at home. NOTE: This sheet is a summary. It may not cover all possible information. If you have questions about this medicine, talk to your doctor, pharmacist, or health care provider.  2021 Elsevier/Gold Standard (2016-02-22 14:37:52)

## 2020-08-26 ENCOUNTER — Encounter: Payer: Self-pay | Admitting: Oncology

## 2020-08-26 NOTE — Telephone Encounter (Signed)
I called pt also about her appt

## 2020-08-27 ENCOUNTER — Inpatient Hospital Stay: Payer: BC Managed Care – PPO

## 2020-08-27 VITALS — BP 121/71 | HR 79 | Temp 97.7°F | Resp 16

## 2020-08-27 DIAGNOSIS — C50412 Malignant neoplasm of upper-outer quadrant of left female breast: Secondary | ICD-10-CM | POA: Diagnosis not present

## 2020-08-27 DIAGNOSIS — Z79899 Other long term (current) drug therapy: Secondary | ICD-10-CM | POA: Diagnosis not present

## 2020-08-27 DIAGNOSIS — Z170 Estrogen receptor positive status [ER+]: Secondary | ICD-10-CM | POA: Diagnosis not present

## 2020-08-27 DIAGNOSIS — Z5112 Encounter for antineoplastic immunotherapy: Secondary | ICD-10-CM | POA: Diagnosis not present

## 2020-08-27 DIAGNOSIS — Z5189 Encounter for other specified aftercare: Secondary | ICD-10-CM | POA: Diagnosis not present

## 2020-08-27 DIAGNOSIS — Z5111 Encounter for antineoplastic chemotherapy: Secondary | ICD-10-CM | POA: Diagnosis not present

## 2020-08-27 MED ORDER — HEPARIN SOD (PORK) LOCK FLUSH 100 UNIT/ML IV SOLN
INTRAVENOUS | Status: AC
  Filled 2020-08-27: qty 5

## 2020-08-27 MED ORDER — HEPARIN SOD (PORK) LOCK FLUSH 100 UNIT/ML IV SOLN
500.0000 [IU] | Freq: Once | INTRAVENOUS | Status: AC
  Administered 2020-08-27: 500 [IU] via INTRAVENOUS
  Filled 2020-08-27: qty 5

## 2020-08-27 MED ORDER — SODIUM CHLORIDE 0.9 % IV SOLN
Freq: Once | INTRAVENOUS | Status: AC
  Filled 2020-08-27: qty 250

## 2020-08-27 NOTE — Progress Notes (Signed)
Patient reports diarrhea started on Tuesday and has decreased with each day. States this is normal for her with each treatment and MD is aware.

## 2020-08-31 ENCOUNTER — Encounter: Payer: Self-pay | Admitting: Oncology

## 2020-09-03 ENCOUNTER — Other Ambulatory Visit: Payer: BC Managed Care – PPO

## 2020-09-03 ENCOUNTER — Ambulatory Visit: Payer: BC Managed Care – PPO

## 2020-09-03 ENCOUNTER — Ambulatory Visit: Payer: BC Managed Care – PPO | Admitting: Oncology

## 2020-09-10 ENCOUNTER — Inpatient Hospital Stay (HOSPITAL_BASED_OUTPATIENT_CLINIC_OR_DEPARTMENT_OTHER): Payer: BC Managed Care – PPO | Admitting: Oncology

## 2020-09-10 ENCOUNTER — Other Ambulatory Visit: Payer: Self-pay

## 2020-09-10 ENCOUNTER — Inpatient Hospital Stay: Payer: BC Managed Care – PPO | Attending: Oncology

## 2020-09-10 ENCOUNTER — Encounter: Payer: Self-pay | Admitting: Oncology

## 2020-09-10 ENCOUNTER — Inpatient Hospital Stay: Payer: BC Managed Care – PPO

## 2020-09-10 VITALS — BP 117/70 | HR 103 | Temp 97.6°F | Resp 20 | Wt 175.2 lb

## 2020-09-10 VITALS — HR 96 | Temp 98.8°F | Resp 20

## 2020-09-10 DIAGNOSIS — Z5112 Encounter for antineoplastic immunotherapy: Secondary | ICD-10-CM | POA: Insufficient documentation

## 2020-09-10 DIAGNOSIS — D6481 Anemia due to antineoplastic chemotherapy: Secondary | ICD-10-CM | POA: Diagnosis not present

## 2020-09-10 DIAGNOSIS — C50412 Malignant neoplasm of upper-outer quadrant of left female breast: Secondary | ICD-10-CM | POA: Diagnosis not present

## 2020-09-10 DIAGNOSIS — Z170 Estrogen receptor positive status [ER+]: Secondary | ICD-10-CM | POA: Diagnosis not present

## 2020-09-10 DIAGNOSIS — Z5111 Encounter for antineoplastic chemotherapy: Secondary | ICD-10-CM | POA: Diagnosis not present

## 2020-09-10 DIAGNOSIS — R7989 Other specified abnormal findings of blood chemistry: Secondary | ICD-10-CM

## 2020-09-10 DIAGNOSIS — Z79899 Other long term (current) drug therapy: Secondary | ICD-10-CM | POA: Insufficient documentation

## 2020-09-10 DIAGNOSIS — R6889 Other general symptoms and signs: Secondary | ICD-10-CM | POA: Insufficient documentation

## 2020-09-10 DIAGNOSIS — T451X5A Adverse effect of antineoplastic and immunosuppressive drugs, initial encounter: Secondary | ICD-10-CM

## 2020-09-10 DIAGNOSIS — R945 Abnormal results of liver function studies: Secondary | ICD-10-CM | POA: Diagnosis not present

## 2020-09-10 LAB — CBC WITH DIFFERENTIAL/PLATELET
Abs Immature Granulocytes: 0.11 10*3/uL — ABNORMAL HIGH (ref 0.00–0.07)
Basophils Absolute: 0 10*3/uL (ref 0.0–0.1)
Basophils Relative: 0 %
Eosinophils Absolute: 0 10*3/uL (ref 0.0–0.5)
Eosinophils Relative: 0 %
HCT: 33.1 % — ABNORMAL LOW (ref 36.0–46.0)
Hemoglobin: 10.6 g/dL — ABNORMAL LOW (ref 12.0–15.0)
Immature Granulocytes: 1 %
Lymphocytes Relative: 18 %
Lymphs Abs: 1.7 10*3/uL (ref 0.7–4.0)
MCH: 21.1 pg — ABNORMAL LOW (ref 26.0–34.0)
MCHC: 32 g/dL (ref 30.0–36.0)
MCV: 65.8 fL — ABNORMAL LOW (ref 80.0–100.0)
Monocytes Absolute: 0.5 10*3/uL (ref 0.1–1.0)
Monocytes Relative: 5 %
Neutro Abs: 7.3 10*3/uL (ref 1.7–7.7)
Neutrophils Relative %: 76 %
Platelets: 335 10*3/uL (ref 150–400)
RBC: 5.03 MIL/uL (ref 3.87–5.11)
RDW: 17.9 % — ABNORMAL HIGH (ref 11.5–15.5)
WBC: 9.6 10*3/uL (ref 4.0–10.5)
nRBC: 0 % (ref 0.0–0.2)

## 2020-09-10 LAB — COMPREHENSIVE METABOLIC PANEL
ALT: 35 U/L (ref 0–44)
AST: 32 U/L (ref 15–41)
Albumin: 3.8 g/dL (ref 3.5–5.0)
Alkaline Phosphatase: 93 U/L (ref 38–126)
Anion gap: 10 (ref 5–15)
BUN: 14 mg/dL (ref 8–23)
CO2: 23 mmol/L (ref 22–32)
Calcium: 9.3 mg/dL (ref 8.9–10.3)
Chloride: 104 mmol/L (ref 98–111)
Creatinine, Ser: 0.77 mg/dL (ref 0.44–1.00)
GFR, Estimated: >60 mL/min (ref >60)
Glucose, Bld: 161 mg/dL — ABNORMAL HIGH (ref 70–99)
Potassium: 3.7 mmol/L (ref 3.5–5.1)
Sodium: 137 mmol/L (ref 135–145)
Total Bilirubin: 0.7 mg/dL (ref 0.3–1.2)
Total Protein: 7 g/dL (ref 6.5–8.1)

## 2020-09-10 MED ORDER — SODIUM CHLORIDE 0.9 % IV SOLN
150.0000 mg | Freq: Once | INTRAVENOUS | Status: AC
  Administered 2020-09-10: 150 mg via INTRAVENOUS
  Filled 2020-09-10: qty 150

## 2020-09-10 MED ORDER — SODIUM CHLORIDE 0.9 % IV SOLN
420.0000 mg | Freq: Once | INTRAVENOUS | Status: AC
  Administered 2020-09-10: 420 mg via INTRAVENOUS
  Filled 2020-09-10: qty 14

## 2020-09-10 MED ORDER — TRASTUZUMAB-DKST CHEMO 150 MG IV SOLR
450.0000 mg | Freq: Once | INTRAVENOUS | Status: AC
  Administered 2020-09-10: 450 mg via INTRAVENOUS
  Filled 2020-09-10: qty 21.43

## 2020-09-10 MED ORDER — HEPARIN SOD (PORK) LOCK FLUSH 100 UNIT/ML IV SOLN
500.0000 [IU] | Freq: Once | INTRAVENOUS | Status: DC | PRN
  Filled 2020-09-10: qty 5

## 2020-09-10 MED ORDER — HEPARIN SOD (PORK) LOCK FLUSH 100 UNIT/ML IV SOLN
500.0000 [IU] | Freq: Once | INTRAVENOUS | Status: AC
Start: 2020-09-10 — End: 2020-09-10
  Administered 2020-09-10: 500 [IU] via INTRAVENOUS
  Filled 2020-09-10: qty 5

## 2020-09-10 MED ORDER — SODIUM CHLORIDE 0.9 % IV SOLN
75.0000 mg/m2 | Freq: Once | INTRAVENOUS | Status: AC
  Administered 2020-09-10: 140 mg via INTRAVENOUS
  Filled 2020-09-10: qty 14

## 2020-09-10 MED ORDER — PEGFILGRASTIM 6 MG/0.6ML ~~LOC~~ PSKT
6.0000 mg | PREFILLED_SYRINGE | Freq: Once | SUBCUTANEOUS | Status: AC
  Administered 2020-09-10: 6 mg via SUBCUTANEOUS
  Filled 2020-09-10: qty 0.6

## 2020-09-10 MED ORDER — ACETAMINOPHEN 325 MG PO TABS
650.0000 mg | ORAL_TABLET | Freq: Once | ORAL | Status: AC
  Administered 2020-09-10: 650 mg via ORAL
  Filled 2020-09-10: qty 2

## 2020-09-10 MED ORDER — DIPHENHYDRAMINE HCL 25 MG PO CAPS
50.0000 mg | ORAL_CAPSULE | Freq: Once | ORAL | Status: AC
  Administered 2020-09-10: 50 mg via ORAL
  Filled 2020-09-10: qty 2

## 2020-09-10 MED ORDER — PALONOSETRON HCL INJECTION 0.25 MG/5ML
0.2500 mg | Freq: Once | INTRAVENOUS | Status: AC
  Administered 2020-09-10: 0.25 mg via INTRAVENOUS
  Filled 2020-09-10: qty 5

## 2020-09-10 MED ORDER — SODIUM CHLORIDE 0.9 % IV SOLN
10.0000 mg | Freq: Once | INTRAVENOUS | Status: AC
  Administered 2020-09-10: 10 mg via INTRAVENOUS
  Filled 2020-09-10: qty 10

## 2020-09-10 MED ORDER — SODIUM CHLORIDE 0.9% FLUSH
10.0000 mL | INTRAVENOUS | Status: DC | PRN
Start: 2020-09-10 — End: 2020-09-10
  Administered 2020-09-10: 10 mL via INTRAVENOUS
  Filled 2020-09-10: qty 10

## 2020-09-10 MED ORDER — SODIUM CHLORIDE 0.9 % IV SOLN
Freq: Once | INTRAVENOUS | Status: AC
  Filled 2020-09-10: qty 250

## 2020-09-10 MED ORDER — SODIUM CHLORIDE 0.9 % IV SOLN
580.0000 mg | Freq: Once | INTRAVENOUS | Status: AC
  Administered 2020-09-10: 580 mg via INTRAVENOUS
  Filled 2020-09-10: qty 58

## 2020-09-10 NOTE — Patient Instructions (Signed)
[facility]  Discharge Instructions: Thank you for choosing [facility] to provide your oncology and hematology care.  If you have a lab appointment with the [facility], please go directly to the [facility] and check in at the registration area.  Wear comfortable clothing and clothing appropriate for easy access to any Portacath or PICC line.   We strive to give you quality time with your provider. You may need to reschedule your appointment if you arrive late (15 or more minutes).  Arriving late affects you and other patients whose appointments are after yours.  Also, if you miss three or more appointments without notifying the office, you may be dismissed from the clinic at the provider's discretion.      For prescription refill requests, have your pharmacy contact our office and allow 72 hours for refills to be completed.    Today you received the following chemotherapy and/or immunotherapy agents [facility]P      To help prevent nausea and vomiting after your treatment, we encourage you to take your nausea medication as directed.  BELOW ARE SYMPTOMS THAT SHOULD BE REPORTED IMMEDIATELY: *FEVER GREATER THAN 100.4 F (38 C) OR HIGHER *CHILLS OR SWEATING *NAUSEA AND VOMITING THAT IS NOT CONTROLLED WITH YOUR NAUSEA MEDICATION *UNUSUAL SHORTNESS OF BREATH *UNUSUAL BRUISING OR BLEEDING *URINARY PROBLEMS (pain or burning when urinating, or frequent urination) *BOWEL PROBLEMS (unusual diarrhea, constipation, pain near the anus) TENDERNESS IN MOUTH AND THROAT WITH OR WITHOUT PRESENCE OF ULCERS (sore throat, sores in mouth, or a toothache) UNUSUAL RASH, SWELLING OR PAIN  UNUSUAL VAGINAL DISCHARGE OR ITCHING   Items with * indicate a potential emergency and should be followed up as soon as possible or go to the Emergency Department if any problems should occur.  Please show the CHEMOTHERAPY ALERT CARD or IMMUNOTHERAPY ALERT CARD at check-in to the  Emergency Department and triage nurse.  Should you have questions after your visit or need to cancel or reschedule your appointment, please contact [facility]  [phone] and follow the prompts.  Office hours are 8:00 a.m. to 4:30 p.m. Monday - Friday. Please note that voicemails left after 4:00 p.m. may not be returned until the following business day.  We are closed weekends and major holidays. You have access to a nurse at all times for urgent questions. Please call the main number to the clinic [phone] and follow the prompts.  For any non-urgent questions, you may also contact your provider using MyChart. We now offer e-Visits for anyone 57 and older to request care online for non-urgent symptoms. For details visit [email or web address].   Also download the MyChart app! Go to the app store, search "MyChart", open the app, select [facility], and log in with your MyChart username and password.  Due to Covid, a mask is required upon entering the hospital/clinic. If you do not have a mask, one will be given to you upon arrival. For doctor visits, patients may have 1 support person aged 102 or older with them. For treatment visits, patients cannot have anyone with them due to current Covid guidelines and our immunocompromised population. Carboplatin injection What is this medication? CARBOPLATIN (KAR boe pla tin) is a chemotherapy drug. It targets fast dividing cells, like cancer cells, and causes these cells to die. This medicine is usedto treat ovarian cancer and many other cancers. This medicine may be used for other purposes; ask your health care provider orpharmacist if you have  questions. COMMON BRAND NAME(S): Paraplatin What should I tell my care team before I take this medication? They need to know if you have any of these conditions: blood disorders hearing problems kidney disease recent or ongoing radiation therapy an unusual or allergic  reaction to carboplatin, cisplatin, other chemotherapy, other medicines, foods, dyes, or preservatives pregnant or trying to get pregnant breast-feeding How should I use this medication? This drug is usually given as an infusion into a vein. It is administered in [facility] or clinic by a specially trained health care professional. Talk to your pediatrician regarding the use of this medicine in children.Special care may be needed. Overdosage: If you think you have taken too much of this medicine contact apoison control center or emergency room at once. NOTE: This medicine is only for you. Do not share this medicine with others. What if I miss a dose? It is important not to miss a dose. Call your doctor or health careprofessional if you are unable to keep an appointment. What may interact with this medication? medicines for seizures medicines to increase blood counts like filgrastim, pegfilgrastim, sargramostim some antibiotics like amikacin, gentamicin, neomycin, streptomycin, tobramycin vaccines Talk to your doctor or health care professional before taking any of thesemedicines: acetaminophen aspirin ibuprofen ketoprofen naproxen This list may not describe all possible interactions. Give your health care provider a list of all the medicines, herbs, non-prescription drugs, or dietary supplements you use. Also tell them if you smoke, drink alcohol, or use illegaldrugs. Some items may interact with your medicine. What should I watch for while using this medication? Your condition will be monitored carefully while you are receiving this medicine. You will need important blood work done while you are taking thismedicine. This drug may make you feel generally unwell. This is not uncommon, as chemotherapy can affect healthy cells as well as cancer cells. Report any side effects. Continue your course of treatment even though you feel ill unless yourdoctor tells you to stop. In some cases, you may be  given additional medicines to help with side effects.Follow all directions for their use. Call your doctor or health care professional for advice if you get a fever, chills or sore throat, or other symptoms of a cold or flu. Do not treat yourself. This drug decreases your body's ability to fight infections. Try toavoid being around people who are sick. This medicine may increase your risk to bruise or bleed. Call your doctor orhealth care professional if you notice any unusual bleeding. Be careful brushing and flossing your teeth or using a toothpick because you may get an infection or bleed more easily. If you have any dental work done,tell your dentist you are receiving this medicine. Avoid taking products that contain aspirin, acetaminophen, ibuprofen, naproxen, or ketoprofen unless instructed by your doctor. These medicines may hide afever. Do not become pregnant while taking this medicine. Women should inform their doctor if they wish to become pregnant or think they might be pregnant. There is a potential for serious side effects to an unborn child. Talk to your health care professional or pharmacist for more information. Do not breast-feed aninfant while taking this medicine. What side effects may I notice from receiving this medication? Side effects that you should report to your doctor or health care professionalas soon as possible: allergic reactions like skin rash, itching or hives, swelling of the face, lips, or tongue signs of infection - fever or chills, cough, sore throat, pain or difficulty passing urine signs of decreased platelets  or bleeding - bruising, pinpoint red spots on the skin, black, tarry stools, nosebleeds signs of decreased red blood cells - unusually weak or tired, fainting spells, lightheadedness breathing problems changes in hearing changes in vision chest pain high blood pressure low blood counts - This drug may decrease the number of white blood cells, red blood  cells and platelets. You may be at increased risk for infections and bleeding. nausea and vomiting pain, swelling, redness or irritation at the injection site pain, tingling, numbness in the hands or feet problems with balance, talking, walking trouble passing urine or change in the amount of urine Side effects that usually do not require medical attention (report to yourdoctor or health care professional if they continue or are bothersome): hair loss loss of appetite metallic taste in the mouth or changes in taste This list may not describe all possible side effects. Call your doctor for medical advice about side effects. You may report side effects to FDA at1-800-FDA-1088. Where should I keep my medication? This drug is given in a hospital or clinic and will not be stored at home. NOTE: This sheet is a summary. It may not cover all possible information. If you have questions about this medicine, talk to your doctor, pharmacist, orhealth care provider.  2022 Elsevier/Gold Standard (2007-06-04 14:38:05) Docetaxel injection What is this medication? DOCETAXEL (doe se TAX el) is a chemotherapy drug. It targets fast dividing cells, like cancer cells, and causes these cells to die. This medicine is used to treat many types of cancers like breast cancer, certain stomach cancers,head and neck cancer, lung cancer, and prostate cancer. This medicine may be used for other purposes; ask your health care provider orpharmacist if you have questions. COMMON BRAND NAME(S): Docefrez, Taxotere What should I tell my care team before I take this medication? They need to know if you have any of these conditions: infection (especially a virus infection such as chickenpox, cold sores, or herpes) liver disease low blood counts, like low white cell, platelet, or red cell counts an unusual or allergic reaction to docetaxel, polysorbate 80, other chemotherapy agents, other medicines, foods, dyes, or  preservatives pregnant or trying to get pregnant breast-feeding How should I use this medication? This drug is given as an infusion into a vein. It is administered in a hospitalor clinic by a specially trained health care professional. Talk to your pediatrician regarding the use of this medicine in children.Special care may be needed. Overdosage: If you think you have taken too much of this medicine contact apoison control center or emergency room at once. NOTE: This medicine is only for you. Do not share this medicine with others. What if I miss a dose? It is important not to miss your dose. Call your doctor or health careprofessional if you are unable to keep an appointment. What may interact with this medication? Do not take this medicine with any of the following medications: live virus vaccines This medicine may also interact with the following medications: aprepitant certain antibiotics like erythromycin or clarithromycin certain antivirals for HIV or hepatitis certain medicines for fungal infections like fluconazole, itraconazole, ketoconazole, posaconazole, or voriconazole cimetidine ciprofloxacin conivaptan cyclosporine dronedarone fluvoxamine grapefruit juice imatinib verapamil This list may not describe all possible interactions. Give your health care provider a list of all the medicines, herbs, non-prescription drugs, or dietary supplements you use. Also tell them if you smoke, drink alcohol, or use illegaldrugs. Some items may interact with your medicine. What should I watch for while using  this medication? Your condition will be monitored carefully while you are receiving this medicine. You will need important blood work done while you are taking thismedicine. Call your doctor or health care professional for advice if you get a fever, chills or sore throat, or other symptoms of a cold or flu. Do not treat yourself. This drug decreases your body's ability to fight  infections. Try toavoid being around people who are sick. Some products may contain alcohol. Ask your health care professional if this medicine contains alcohol. Be sure to tell all health care professionals you are taking this medicine. Certain medicines, like metronidazole and disulfiram, can cause an unpleasant reaction when taken with alcohol. The reaction includes flushing, headache, nausea, vomiting, sweating, and increased thirst. Thereaction can last from 30 minutes to several hours. You may get drowsy or dizzy. Do not drive, use machinery, or do anything that needs mental alertness until you know how this medicine affects you. Do not stand or sit up quickly, especially if you are an older patient. This reduces the risk of dizzy or fainting spells. Alcohol may interfere with the effect ofthis medicine. Talk to your health care professional about your risk of cancer. You may bemore at risk for certain types of cancer if you take this medicine. Do not become pregnant while taking this medicine or for 6 months after stopping it. Women should inform their doctor if they wish to become pregnant or think they might be pregnant. There is a potential for serious side effects to an unborn child. Talk to your health care professional or pharmacist for more information. Do not breast-feed an infant while taking this medicine orfor 1 week after stopping it. Males who get this medicine must use a condom during sex with females who can get pregnant. If you get a woman pregnant, the baby could have birth defects. The baby could die before they are born. You will need to continue wearing a condom for 3 months after stopping the medicine. Tell your health care providerright away if your partner becomes pregnant while you are taking this medicine. This may interfere with the ability to father a child. You should talk to yourdoctor or health care professional if you are concerned about your fertility. What side effects  may I notice from receiving this medication? Side effects that you should report to your doctor or health care professionalas soon as possible: allergic reactions like skin rash, itching or hives, swelling of the face, lips, or tongue blurred vision breathing problems changes in vision low blood counts - This drug may decrease the number of white blood cells, red blood cells and platelets. You may be at increased risk for infections and bleeding. nausea and vomiting pain, redness or irritation at site where injected pain, tingling, numbness in the hands or feet redness, blistering, peeling, or loosening of the skin, including inside the mouth signs of decreased platelets or bleeding - bruising, pinpoint red spots on the skin, black, tarry stools, nosebleeds signs of decreased red blood cells - unusually weak or tired, fainting spells, lightheadedness signs of infection - fever or chills, cough, sore throat, pain or difficulty passing urine swelling of the ankle, feet, hands Side effects that usually do not require medical attention (report to yourdoctor or health care professional if they continue or are bothersome): constipation diarrhea fingernail or toenail changes hair loss loss of appetite mouth sores muscle pain This list may not describe all possible side effects. Call your doctor for medical advice about  side effects. You may report side effects to FDA at1-800-FDA-1088. Where should I keep my medication? This drug is given in a hospital or clinic and will not be stored at home. NOTE: This sheet is a summary. It may not cover all possible information. If you have questions about this medicine, talk to your doctor, pharmacist, orhealth care provider.  2022 Elsevier/Gold Standard (2019-01-27 19:50:31) Trastuzumab injection for infusion What is this medication? TRASTUZUMAB (tras TOO zoo mab) is a monoclonal antibody. It is used to treatbreast cancer and stomach cancer. This  medicine may be used for other purposes; ask your health care provider orpharmacist if you have questions. COMMON BRAND NAME(S): Herceptin, Janae Bridgeman, Ontruzant, Trazimera What should I tell my care team before I take this medication? They need to know if you have any of these conditions: heart disease heart failure lung or breathing disease, like asthma an unusual or allergic reaction to trastuzumab, benzyl alcohol, or other medications, foods, dyes, or preservatives pregnant or trying to get pregnant breast-feeding How should I use this medication? This drug is given as an infusion into a vein. It is administered in a hospitalor clinic by a specially trained health care professional. Talk to your pediatrician regarding the use of this medicine in children. Thismedicine is not approved for use in children. Overdosage: If you think you have taken too much of this medicine contact apoison control center or emergency room at once. NOTE: This medicine is only for you. Do not share this medicine with others. What if I miss a dose? It is important not to miss a dose. Call your doctor or health careprofessional if you are unable to keep an appointment. What may interact with this medication? This medicine may interact with the following medications: certain types of chemotherapy, such as daunorubicin, doxorubicin, epirubicin, and idarubicin This list may not describe all possible interactions. Give your health care provider a list of all the medicines, herbs, non-prescription drugs, or dietary supplements you use. Also tell them if you smoke, drink alcohol, or use illegaldrugs. Some items may interact with your medicine. What should I watch for while using this medication? Visit your doctor for checks on your progress. Report any side effects. Continue your course of treatment even though you feel ill unless your doctortells you to stop. Call your doctor or health care professional  for advice if you get a fever, chills or sore throat, or other symptoms of a cold or flu. Do not treatyourself. Try to avoid being around people who are sick. You may experience fever, chills and shaking during your first infusion. These effects are usually mild and can be treated with other medicines. Report any side effects during the infusion to your health care professional. Fever andchills usually do not happen with later infusions. Do not become pregnant while taking this medicine or for 7 months after stopping it. Women should inform their doctor if they wish to become pregnant or think they might be pregnant. Women of child-bearing potential will need to have a negative pregnancy test before starting this medicine. There is a potential for serious side effects to an unborn child. Talk to your health care professional or pharmacist for more information. Do not breast-feed an infantwhile taking this medicine or for 7 months after stopping it. Women must use effective birth control with this medicine. What side effects may I notice from receiving this medication? Side effects that you should report to your doctor or health care professionalas soon as possible:  allergic reactions like skin rash, itching or hives, swelling of the face, lips, or tongue chest pain or palpitations cough dizziness feeling faint or lightheaded, falls fever general ill feeling or flu-like symptoms signs of worsening heart failure like breathing problems; swelling in your legs and feet unusually weak or tired Side effects that usually do not require medical attention (report to yourdoctor or health care professional if they continue or are bothersome): bone pain changes in taste diarrhea joint pain nausea/vomiting weight loss This list may not describe all possible side effects. Call your doctor for medical advice about side effects. You may report side effects to FDA at1-800-FDA-1088. Where should I keep my  medication? This drug is given in a hospital or clinic and will not be stored at home. NOTE: This sheet is a summary. It may not cover all possible information. If you have questions about this medicine, talk to your doctor, pharmacist, orhealth care provider.  2022 Elsevier/Gold Standard (2016-02-22 14:37:52) Pertuzumab injection What is this medication? PERTUZUMAB (per TOOZ ue mab) is a monoclonal antibody. It is used to treatbreast cancer. This medicine may be used for other purposes; ask your health care provider orpharmacist if you have questions. COMMON BRAND NAME(S): PERJETA What should I tell my care team before I take this medication? They need to know if you have any of these conditions: heart disease heart failure high blood pressure history of irregular heart beat recent or ongoing radiation therapy an unusual or allergic reaction to pertuzumab, other medicines, foods, dyes, or preservatives pregnant or trying to get pregnant breast-feeding How should I use this medication? This medicine is for infusion into a vein. It is given by a health careprofessional in a hospital or clinic setting. Talk to your pediatrician regarding the use of this medicine in children.Special care may be needed. Overdosage: If you think you have taken too much of this medicine contact apoison control center or emergency room at once. NOTE: This medicine is only for you. Do not share this medicine with others. What if I miss a dose? It is important not to miss your dose. Call your doctor or health careprofessional if you are unable to keep an appointment. What may interact with this medication? Interactions are not expected. Give your health care provider a list of all the medicines, herbs, non-prescription drugs, or dietary supplements you use. Also tell them if you smoke, drink alcohol, or use illegal drugs. Some items may interact with yourmedicine. This list may not describe all possible  interactions. Give your health care provider a list of all the medicines, herbs, non-prescription drugs, or dietary supplements you use. Also tell them if you smoke, drink alcohol, or use illegaldrugs. Some items may interact with your medicine. What should I watch for while using this medication? Your condition will be monitored carefully while you are receiving this medicine. Report any side effects. Continue your course of treatment eventhough you feel ill unless your doctor tells you to stop. Do not become pregnant while taking this medicine or for 7 months after stopping it. Women should inform their doctor if they wish to become pregnant or think they might be pregnant. Women of child-bearing potential will need to have a negative pregnancy test before starting this medicine. There is a potential for serious side effects to an unborn child. Talk to your health care professional or pharmacist for more information. Do not breast-feed an infantwhile taking this medicine or for 7 months after stopping it. Women must use effective  birth control with this medicine. Call your doctor or health care professional for advice if you get a fever, chills or sore throat, or other symptoms of a cold or flu. Do not treatyourself. Try to avoid being around people who are sick. You may experience fever, chills, and headache during the infusion. Report anyside effects during the infusion to your health care professional. What side effects may I notice from receiving this medication? Side effects that you should report to your doctor or health care professionalas soon as possible: breathing problems chest pain or palpitations dizziness feeling faint or lightheaded fever or chills skin rash, itching or hives sore throat swelling of the face, lips, or tongue swelling of the legs or ankles unusually weak or tired Side effects that usually do not require medical attention (report to yourdoctor or health care  professional if they continue or are bothersome): diarrhea hair loss nausea, vomiting tiredness This list may not describe all possible side effects. Call your doctor for medical advice about side effects. You may report side effects to FDA at1-800-FDA-1088. Where should I keep my medication? This drug is given in a hospital or clinic and will not be stored at home. NOTE: This sheet is a summary. It may not cover all possible information. If you have questions about this medicine, talk to your doctor, pharmacist, orhealth care provider.  2022 Elsevier/Gold Standard (2015-04-01 12:08:50)

## 2020-09-10 NOTE — Progress Notes (Signed)
Hematology/Oncology Consult note [facility]  [phone] [phone]  Patient Care Team: Kirk Ruths, MD as PCP - General (Internal Medicine) Rico Junker, RN as Oncology Nurse Navigator   Name of the patient: Wendy Caldwell  [id number]  1955-07-08   Date of visit: 09/10/20  Diagnosis-  left breast cancer clinical prognostic stage IIA cT2 N0 M0 ER weakly positive, PR negative HER2 positive    Chief complaint/ Reason for visit-on treatment assessment prior to cycle 3 of neoadjuvant TCHP chemotherapy  Heme/Onc history: Patient is a 65 year old female with a past medical history significant for psoriasis for which she is on Humira.  She self palpated a left breast mass which prompted a bilateral diagnostic mammogram on 06/25/2020.  That showed an irregular hypoechoic mass 2.5 x 1.7 x 1.8 cm at 1 o'clock position 1 cm from the nipple.  3 cm from the nipple were benign cysts.  1 mildly enlarged left axillary lymph node with cortical thickening 4 mm.  Both the breast mass and the lymph node were biopsied.  Left breast biopsy was consistent with invasive mammary carcinoma grade 3 ER weakly +1 to 10%, PR negative and HER2 positive by IHC.   Plan is to proceed with 6 cycles of neoadjuvant TCHP chemotherapy followed by surgery  Patient has baseline microcytosis with a hemoglobin of around 12.  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  Interval history-patient is tolerating chemotherapy relatively well so far.  She does have some self-limited nausea and diarrhea which lasts for a few days after chemotherapy.  Also reports dry eyes during that time.  Symptoms often improve a week after chemotherapy.  Appetite and weight have remained stable.  Denies any tingling numbness in her extremities.  ECOG PS- 1 Pain scale- 0   Review of systems-  Review of Systems  Constitutional:  Negative for chills, fever, malaise/fatigue and weight loss.  HENT:  Negative for congestion, ear discharge and nosebleeds.   Eyes:  Negative for blurred vision.  Respiratory:  Negative for cough, hemoptysis, sputum production, shortness of breath and wheezing.   Cardiovascular:  Negative for chest pain, palpitations, orthopnea and claudication.  Gastrointestinal:  Negative for abdominal pain, blood in stool, constipation, diarrhea, heartburn, melena, nausea and vomiting.  Genitourinary:  Negative for dysuria, flank pain, frequency, hematuria and urgency.  Musculoskeletal:  Negative for back pain, joint pain and myalgias.  Skin:  Negative for rash.  Neurological:  Negative for dizziness, tingling, focal weakness, seizures, weakness and headaches.  Endo/Heme/Allergies:  Does not bruise/bleed easily.  Psychiatric/Behavioral:  Negative for depression and suicidal ideas. The patient does not have insomnia.      No Known Allergies   Past Medical History:  Diagnosis Date   Family history of adverse reaction to anesthesia    adopted   GERD (gastroesophageal reflux disease)      Past Surgical History:  Procedure Laterality Date   BREAST BIOPSY Left 07/05/2020   [place] Bx, Q clip, path pending   BREAST BIOPSY Left 07/05/2020   [place] Bx, Vision, path pending    CARPAL TUNNEL RELEASE Bilateral    CHOLECYSTECTOMY     GANGLION CYST EXCISION     wrist   PORTACATH PLACEMENT Right 07/21/2020   Procedure: INSERTION PORT-A-CATH;  Surgeon: Herbert Pun, MD;  Location: [facility] ORS;  Service: General;  Laterality: Right;   right ovary removal  Social History   Socioeconomic History   Marital status: Married    Spouse name: Not on file   Number of children: Not on file   Years of education: Not on file   Highest education level: Not on file  Occupational History   Not on file  Tobacco Use   Smoking status: Former    Pack years: 0.00    Types:  Cigarettes    Start date: 2012   Smokeless tobacco: Never  Vaping Use   Vaping Use: Never used  Substance and Sexual Activity   Alcohol use: Yes    Comment: occassional    Drug use: Never   Sexual activity: Not on file  Other Topics Concern   Not on file  Social History Narrative   Not on file   Social Determinants of Health   Financial Resource Strain: Not on file  Food Insecurity: Not on file  Transportation Needs: Not on file  Physical Activity: Not on file  Stress: Not on file  Social Connections: Not on file  Intimate Partner Violence: Not on file    Family History  Adopted: Yes  Problem Relation Age of Onset   Breast cancer Neg Hx      Current Outpatient Medications:    Adalimumab (HUMIRA) 40 MG/0.8ML PSKT, Inject 40 mg into the skin every 14 (fourteen) days., Disp: , Rfl:    dexamethasone (DECADRON) 4 MG tablet, Take 2 tablets (8 mg total) by mouth 2 (two) times daily. Start the day before Taxotere. Then take daily x 3 days after chemotherapy., Disp: 30 tablet, Rfl: 1   lidocaine-prilocaine (EMLA) cream, Apply to affected area once (Patient taking differently: Apply 1 application topically daily as needed (prior to port access).), Disp: 30 g, Rfl: 3   loratadine (CLARITIN) 10 MG tablet, Take 10 mg by mouth daily., Disp: , Rfl:    LORazepam (ATIVAN) 0.5 MG tablet, Take 1 tablet (0.5 mg total) by mouth every 6 (six) hours as needed (Nausea or vomiting)., Disp: 30 tablet, Rfl: 0   omeprazole (PRILOSEC) 40 MG capsule, Take 40 mg by mouth daily., Disp: , Rfl:    ondansetron (ZOFRAN) 8 MG tablet, Take 1 tablet (8 mg total) by mouth 2 (two) times daily as needed (Nausea or vomiting). Start on the third day after chemotherapy., Disp: 30 tablet, Rfl: 1   prochlorperazine (COMPAZINE) 10 MG tablet, Take 1 tablet (10 mg total) by mouth every 6 (six) hours as needed (Nausea or vomiting)., Disp: 30 tablet, Rfl: 1   triamcinolone ointment (KENALOG) 0.5 %, Apply 1 application  topically 2 (two) times daily., Disp: 30 g, Rfl: 0   vitamin B-12 (CYANOCOBALAMIN) 1000 MCG tablet, Take 1,000 mcg by mouth daily., Disp: , Rfl:    vitamin C (ASCORBIC ACID) 500 MG tablet, Take 500 mg by mouth daily., Disp: , Rfl:  No current facility-administered medications for this visit.  Facility-Administered Medications Ordered in Other Visits:    heparin lock flush 100 unit/mL, 500 Units, Intravenous, Once, Sindy Guadeloupe, MD   sodium chloride flush (NS) 0.9 % injection 10 mL, 10 mL, Intravenous, PRN, Sindy Guadeloupe, MD, 10 mL at 09/10/20 0819  Physical exam:  Vitals:   09/10/20 0838  BP: 117/70  Pulse: (!) 103  Resp: 20  Temp: 97.6 F (36.4 C)  SpO2: 99%  Weight: 175 lb 3.2 oz (79.5 kg)   Physical Exam Cardiovascular:     Rate and Rhythm: Regular rhythm. Tachycardia present.     Heart  sounds: Normal heart sounds.  Pulmonary:     Effort: Pulmonary effort is normal.     Breath sounds: Normal breath sounds.  Abdominal:     General: Bowel sounds are normal.     Palpations: Abdomen is soft.  Skin:    General: Skin is warm and dry.  Neurological:     Mental Status: She is alert and oriented to person, place, and time.     CMP Latest Ref Rng & Units 08/19/2020  Glucose 70 - 99 mg/dL 139(H)  BUN 8 - 23 mg/dL 16  Creatinine 0.44 - 1.00 mg/dL 0.65  Sodium 135 - 145 mmol/L 138  Potassium 3.5 - 5.1 mmol/L 3.7  Chloride 98 - 111 mmol/L 102  CO2 22 - 32 mmol/L 24  Calcium 8.9 - 10.3 mg/dL 9.4  Total Protein 6.5 - 8.1 g/dL 7.3  Total Bilirubin 0.3 - 1.2 mg/dL 0.8  Alkaline Phos 38 - 126 U/L 124  AST 15 - 41 U/L 28  ALT 0 - 44 U/L 58(H)   CBC Latest Ref Rng & Units 09/10/2020  WBC 4.0 - 10.5 K/uL 9.6  Hemoglobin 12.0 - 15.0 g/dL 10.6(L)  Hematocrit 36.0 - 46.0 % 33.1(L)  Platelets 150 - 400 K/uL 335    No images are attached to the encounter.  MM CLIP PLACEMENT LEFT  Result Date: 08/12/2020 CLINICAL DATA:  Post MRI guided biopsy of an enhancing mass in the upper-outer  left breast and MRI guided biopsy of linear enhancement in the retroareolar right breast. EXAM: DIAGNOSTIC BILATERAL MAMMOGRAM POST MRI BIOPSY COMPARISON:  Previous exams. FINDINGS: Mammographic images were obtained following MRI guided biopsy of an enhancing mass in the upper-outer left breast and linear enhancement in the retroareolar right breast. A dumbbell shaped biopsy marking clip is present at the site of the biopsied enhancing mass in the upper-outer left breast. A dumbbell shaped biopsy marking clip is present in the retroareolar right breast at site of biopsied linear enhancement. IMPRESSION: 1. Dumbbell shaped biopsy marking clip at site of biopsied enhancing mass in the upper-outer left breast. 2. Dumbbell shaped biopsy marking clip at site of biopsy linear enhancement in the retroareolar right breast. Final Assessment: Post Procedure Mammograms for Marker Placement Electronically Signed   By: Everlean Alstrom M.D.   On: 08/12/2020 10:28   MM CLIP PLACEMENT RIGHT  Result Date: 08/12/2020 CLINICAL DATA:  Post MRI guided biopsy of an enhancing mass in the upper-outer left breast and MRI guided biopsy of linear enhancement in the retroareolar right breast. EXAM: DIAGNOSTIC BILATERAL MAMMOGRAM POST MRI BIOPSY COMPARISON:  Previous exams. FINDINGS: Mammographic images were obtained following MRI guided biopsy of an enhancing mass in the upper-outer left breast and linear enhancement in the retroareolar right breast. A dumbbell shaped biopsy marking clip is present at the site of the biopsied enhancing mass in the upper-outer left breast. A dumbbell shaped biopsy marking clip is present in the retroareolar right breast at site of biopsied linear enhancement. IMPRESSION: 1. Dumbbell shaped biopsy marking clip at site of biopsied enhancing mass in the upper-outer left breast. 2. Dumbbell shaped biopsy marking clip at site of biopsy linear enhancement in the retroareolar right breast. Final Assessment: Post  Procedure Mammograms for Marker Placement Electronically Signed   By: Everlean Alstrom M.D.   On: 08/12/2020 10:28   MR LT BREAST BX W LOC DEV 1ST LESION IMAGE BX SPEC MR GUIDE  Addendum Date: 08/17/2020   ADDENDUM REPORT: 08/16/2020 08:27 ADDENDUM: Pathology revealed BENIGN BREAST  TISSUE WITH INCREASED STROMAL FIBROSIS AND PATCHY PERIDUCTULAR CHRONIC INFLAMMATION of the LEFT breast, upper outer quadrant. This was found to be concordant by Dr. Everlean Alstrom. Pathology revealed BENIGN BREAST TISSUE WITH INCREASED STROMAL FIBROSIS of the RIGHT breast, retroareolar. This was found to be concordant by Dr. Everlean Alstrom. Pathology results were discussed with the patient by telephone. The patient reported doing well after the biopsies with tenderness at the sites. Post biopsy instructions and care were reviewed and questions were answered. The patient was encouraged to call [facility] for any additional concerns. The patient has a recent diagnosis of LEFT breast cancer and should follow her outlined treatment plan. Pathology results reported by Terie Purser, RN on 08/16/2020. Electronically Signed   By: Everlean Alstrom M.D.   On: 08/16/2020 08:27   Result Date: 08/17/2020 CLINICAL DATA:  65 year old female with recent diagnosis of invasive mammary carcinoma in the retroareolar left breast presents for MRI guided biopsy a second 10 mm mass in the upper slightly outer left breast and and a 12 mm area of linear enhancement in the retroareolar right breast anterior to mid depth. EXAM: MRI GUIDED CORE NEEDLE BIOPSY OF THE BILATERAL BREASTS TECHNIQUE: Multiplanar, multisequence MR imaging of the bilateral breasts was performed both before and after administration of intravenous contrast. CONTRAST:  8 mL Gadavist. COMPARISON:  Previous exams. FINDINGS: I met with the patient, and we discussed the procedure of MRI guided biopsy, including risks, benefits, and alternatives. Specifically, we  discussed the risks of infection, bleeding, tissue injury, clip migration, and inadequate sampling. Informed, written consent was given. The usual time out protocol was performed immediately prior to the procedure. SITE 1 LEFT BREAST UPPER OUTER 10 MM ENHANCING MASS: Using sterile technique, 1% Lidocaine, MRI guidance, and a 9 gauge vacuum assisted device, biopsy was performed of the enhancing mass in the upper-outer left breast using a lateral to medial approach. At the conclusion of the procedure, a dumbbell shaped tissue marker clip was deployed into the biopsy cavity. Follow-up 2-view mammogram was performed and dictated separately. SITE 2 RIGHT BREAST RETROAREOLAR 12 MM LINEAR ENHANCEMENT: Using sterile technique, 1% Lidocaine, MRI guidance, and a 9 gauge vacuum assisted device, biopsy was performed of the linear enhancement in the retroareolar right breast using a lateral to medial approach. At the conclusion of the procedure, a dumbbell shaped tissue marker clip was deployed into the biopsy cavity. Follow-up 2-view mammogram was performed and dictated separately. IMPRESSION: 1. MRI guided biopsy of the enhancing mass in the upper-outer left breast. 2. MRI guided biopsy of the linear enhancement in the retroareolar right breast. Electronically Signed: By: Everlean Alstrom M.D. On: 08/12/2020 10:05   MR RT BREAST BX W LOC DEV 1ST LESION IMAGE BX SPEC MR GUIDE  Addendum Date: 08/17/2020   ADDENDUM REPORT: 08/16/2020 08:27 ADDENDUM: Pathology revealed BENIGN BREAST TISSUE WITH INCREASED STROMAL FIBROSIS AND PATCHY PERIDUCTULAR CHRONIC INFLAMMATION of the LEFT breast, upper outer quadrant. This was found to be concordant by Dr. Everlean Alstrom. Pathology revealed BENIGN BREAST TISSUE WITH INCREASED STROMAL FIBROSIS of the RIGHT breast, retroareolar. This was found to be concordant by Dr. Everlean Alstrom. Pathology results were discussed with the patient by telephone. The patient reported doing well after the  biopsies with tenderness at the sites. Post biopsy instructions and care were reviewed and questions were answered. The patient was encouraged to call [facility] for any additional concerns. The patient has a recent diagnosis of LEFT  breast cancer and should follow her outlined treatment plan. Pathology results reported by Terie Purser, RN on 08/16/2020. Electronically Signed   By: Everlean Alstrom M.D.   On: 08/16/2020 08:27   Result Date: 08/17/2020 CLINICAL DATA:  65 year old female with recent diagnosis of invasive mammary carcinoma in the retroareolar left breast presents for MRI guided biopsy a second 10 mm mass in the upper slightly outer left breast and and a 12 mm area of linear enhancement in the retroareolar right breast anterior to mid depth. EXAM: MRI GUIDED CORE NEEDLE BIOPSY OF THE BILATERAL BREASTS TECHNIQUE: Multiplanar, multisequence MR imaging of the bilateral breasts was performed both before and after administration of intravenous contrast. CONTRAST:  8 mL Gadavist. COMPARISON:  Previous exams. FINDINGS: I met with the patient, and we discussed the procedure of MRI guided biopsy, including risks, benefits, and alternatives. Specifically, we discussed the risks of infection, bleeding, tissue injury, clip migration, and inadequate sampling. Informed, written consent was given. The usual time out protocol was performed immediately prior to the procedure. SITE 1 LEFT BREAST UPPER OUTER 10 MM ENHANCING MASS: Using sterile technique, 1% Lidocaine, MRI guidance, and a 9 gauge vacuum assisted device, biopsy was performed of the enhancing mass in the upper-outer left breast using a lateral to medial approach. At the conclusion of the procedure, a dumbbell shaped tissue marker clip was deployed into the biopsy cavity. Follow-up 2-view mammogram was performed and dictated separately. SITE 2 RIGHT BREAST RETROAREOLAR 12 MM LINEAR ENHANCEMENT: Using sterile technique, 1%  Lidocaine, MRI guidance, and a 9 gauge vacuum assisted device, biopsy was performed of the linear enhancement in the retroareolar right breast using a lateral to medial approach. At the conclusion of the procedure, a dumbbell shaped tissue marker clip was deployed into the biopsy cavity. Follow-up 2-view mammogram was performed and dictated separately. IMPRESSION: 1. MRI guided biopsy of the enhancing mass in the upper-outer left breast. 2. MRI guided biopsy of the linear enhancement in the retroareolar right breast. Electronically Signed: By: Everlean Alstrom M.D. On: 08/12/2020 10:05     Assessment and plan- Patient is a 65 y.o. 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   Counts okay to proceed with cycle 3 of neoadjuvant TCHP chemotherapy today with on for Neulasta support.  I will see her back in 3 weeks for cycle 4.  Overall tolerating chemotherapy well.  We will obtain interim ultrasound after 3 cycles of treatment.  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  Her hemoglobin prior to starting chemotherapy was closer to 12 and is drifted down to 10.6 today.  This is likely secondary to chemotherapy.  We will check iron studies B12 and folate with next set of labs.  Abnormal LFTs: Currently resolved   Visit Diagnosis 1. Encounter for antineoplastic chemotherapy   2. Encounter for monoclonal antibody treatment for malignancy   3. Antineoplastic chemotherapy induced anemia   4. Malignant neoplasm of upper-outer quadrant of left breast in female, estrogen receptor positive ([place])   5. Abnormal LFTs      Dr. Randa Evens, MD, MPH [facility] [phone] 09/10/2020 10:01 AM

## 2020-09-15 ENCOUNTER — Telehealth: Payer: Self-pay | Admitting: Oncology

## 2020-09-15 NOTE — Telephone Encounter (Signed)
Patient had left VM on answering service this morning. She would like to move her appts from 7/21 to 7/22. I explained to patient that Dr. Janese Banks is not in the office this day and she is agreeable to keep as it. She has requested (noted in appt notes) that her following appts be made on Mondays if possible.

## 2020-09-20 ENCOUNTER — Inpatient Hospital Stay (HOSPITAL_BASED_OUTPATIENT_CLINIC_OR_DEPARTMENT_OTHER): Payer: BC Managed Care – PPO | Admitting: Hospice and Palliative Medicine

## 2020-09-20 ENCOUNTER — Inpatient Hospital Stay: Payer: BC Managed Care – PPO

## 2020-09-20 ENCOUNTER — Other Ambulatory Visit: Payer: Self-pay

## 2020-09-20 VITALS — BP 106/67 | HR 101 | Temp 99.5°F | Resp 16 | Wt 169.4 lb

## 2020-09-20 VITALS — BP 105/66 | HR 88 | Resp 16

## 2020-09-20 DIAGNOSIS — C50412 Malignant neoplasm of upper-outer quadrant of left female breast: Secondary | ICD-10-CM

## 2020-09-20 DIAGNOSIS — Z5112 Encounter for antineoplastic immunotherapy: Secondary | ICD-10-CM | POA: Diagnosis not present

## 2020-09-20 DIAGNOSIS — Z170 Estrogen receptor positive status [ER+]: Secondary | ICD-10-CM | POA: Diagnosis not present

## 2020-09-20 DIAGNOSIS — D6481 Anemia due to antineoplastic chemotherapy: Secondary | ICD-10-CM | POA: Diagnosis not present

## 2020-09-20 DIAGNOSIS — Z5111 Encounter for antineoplastic chemotherapy: Secondary | ICD-10-CM | POA: Diagnosis not present

## 2020-09-20 DIAGNOSIS — R6889 Other general symptoms and signs: Secondary | ICD-10-CM | POA: Diagnosis not present

## 2020-09-20 DIAGNOSIS — Z79899 Other long term (current) drug therapy: Secondary | ICD-10-CM | POA: Diagnosis not present

## 2020-09-20 MED ORDER — LIDOCAINE-PRILOCAINE 2.5-2.5 % EX CREA: 1.0000 "application " | TOPICAL_CREAM | Freq: Every day | CUTANEOUS | 2 refills | Status: DC | PRN

## 2020-09-20 MED ORDER — HEPARIN SOD (PORK) LOCK FLUSH 100 UNIT/ML IV SOLN
INTRAVENOUS | Status: AC
  Filled 2020-09-20: qty 5

## 2020-09-20 MED ORDER — SODIUM CHLORIDE 0.9 % IV SOLN
INTRAVENOUS | Status: DC
  Filled 2020-09-20 (×2): qty 250

## 2020-09-20 MED ORDER — HEPARIN SOD (PORK) LOCK FLUSH 100 UNIT/ML IV SOLN
500.0000 [IU] | Freq: Once | INTRAVENOUS | Status: AC
  Administered 2020-09-20: 500 [IU] via INTRAVENOUS
  Filled 2020-09-20: qty 5

## 2020-09-20 NOTE — Progress Notes (Signed)
Hematology/Oncology Consult note [facility]  Telephone:([phone] Fax:([phone]  Patient Care Team: Kirk Ruths, MD as PCP - General (Internal Medicine) Rico Junker, RN as Oncology Nurse Navigator   Name of the patient: Wendy Caldwell  [id number]  Jul 12, 1955   Date of visit: 09/20/20  Diagnosis-  left breast cancer clinical prognostic stage IIA cT2 N0 M0 ER weakly positive, PR negative HER2 positive    Chief complaint/ Reason for visit-on treatment assessment for consideration of IVFs  Heme/Onc history: Patient is a 65 year old female with a past medical history significant for psoriasis for which she is on Humira.  She self palpated a left breast mass which prompted a bilateral diagnostic mammogram on 06/25/2020.  That showed an irregular hypoechoic mass 2.5 x 1.7 x 1.8 cm at 1 o'clock position 1 cm from the nipple.  3 cm from the nipple were benign cysts.  1 mildly enlarged left axillary lymph node with cortical thickening 4 mm.  Both the breast mass and the lymph node were biopsied.  Left breast biopsy was consistent with invasive mammary carcinoma grade 3 ER weakly +1 to 10%, PR negative and HER2 positive by IHC.   Plan is to proceed with 6 cycles of neoadjuvant TCHP chemotherapy followed by surgery  Patient has baseline microcytosis with a hemoglobin of around 12.  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  Interval history-patient is tolerating chemotherapy relatively well so far.  She denies any significant changes or concerns since she was last seen on 7/1.  She does continue to have chronic dry mouth/dry eyes but no mucositis, lesions, or thrush.  She does have loose stools but denies diarrhea.  She endorses poor appetite but stable weight.  ECOG PS- 1 Pain scale- 0   Review of systems- Review of Systems  Constitutional:  Negative  for chills, fever, malaise/fatigue and weight loss.  HENT:  Negative for congestion, ear discharge and nosebleeds.   Eyes:  Negative for blurred vision.  Respiratory:  Negative for cough, hemoptysis, sputum production, shortness of breath and wheezing.   Cardiovascular:  Negative for chest pain, palpitations, orthopnea and claudication.  Gastrointestinal:  Negative for abdominal pain, blood in stool, constipation, diarrhea, heartburn, melena, nausea and vomiting.  Genitourinary:  Negative for dysuria, flank pain, frequency, hematuria and urgency.  Musculoskeletal:  Negative for back pain, joint pain and myalgias.  Skin:  Negative for rash.  Neurological:  Negative for dizziness, tingling, focal weakness, seizures, weakness and headaches.  Endo/Heme/Allergies:  Does not bruise/bleed easily.  Psychiatric/Behavioral:  Negative for depression and suicidal ideas. The patient does not have insomnia.      No Known Allergies   Past Medical History:  Diagnosis Date   Family history of adverse reaction to anesthesia    adopted   GERD (gastroesophageal reflux disease)      Past Surgical History:  Procedure Laterality Date   BREAST BIOPSY Left 07/05/2020   [place] Bx, Q clip, path pending   BREAST BIOPSY Left 07/05/2020   [place] Bx, Vision, path pending    CARPAL TUNNEL RELEASE Bilateral    CHOLECYSTECTOMY     GANGLION CYST EXCISION     wrist   PORTACATH PLACEMENT Right 07/21/2020   Procedure: INSERTION PORT-A-CATH;  Surgeon: Herbert Pun, MD;  Location: [facility] ORS;  Service: General;  Laterality: Right;   right ovary removal      Social History  Socioeconomic History   Marital status: Married    Spouse name: Not on file   Number of children: Not on file   Years of education: Not on file   Highest education level: Not on file  Occupational History   Not on file  Tobacco Use   Smoking status: Former    Pack years: 0.00    Types: Cigarettes    Start date: 2012   Smokeless  tobacco: Never  Vaping Use   Vaping Use: Never used  Substance and Sexual Activity   Alcohol use: Yes    Comment: occassional    Drug use: Never   Sexual activity: Not on file  Other Topics Concern   Not on file  Social History Narrative   Not on file   Social Determinants of Health   Financial Resource Strain: Not on file  Food Insecurity: Not on file  Transportation Needs: Not on file  Physical Activity: Not on file  Stress: Not on file  Social Connections: Not on file  Intimate Partner Violence: Not on file    Family History  Adopted: Yes  Problem Relation Age of Onset   Breast cancer Neg Hx      Current Outpatient Medications:    dexamethasone (DECADRON) 4 MG tablet, Take 2 tablets (8 mg total) by mouth 2 (two) times daily. Start the day before Taxotere. Then take daily x 3 days after chemotherapy., Disp: 30 tablet, Rfl: 1   lidocaine-prilocaine (EMLA) cream, Apply to affected area once (Patient taking differently: Apply 1 application topically daily as needed (prior to port access).), Disp: 30 g, Rfl: 3   loratadine (CLARITIN) 10 MG tablet, Take 10 mg by mouth daily., Disp: , Rfl:    LORazepam (ATIVAN) 0.5 MG tablet, Take 1 tablet (0.5 mg total) by mouth every 6 (six) hours as needed (Nausea or vomiting)., Disp: 30 tablet, Rfl: 0   Multiple Vitamins-Minerals (VITEYES AREDS FORMULA/LUTEIN) CAPS, Take by mouth., Disp: , Rfl:    omeprazole (PRILOSEC) 40 MG capsule, Take 40 mg by mouth daily., Disp: , Rfl:    ondansetron (ZOFRAN) 8 MG tablet, Take 1 tablet (8 mg total) by mouth 2 (two) times daily as needed (Nausea or vomiting). Start on the third day after chemotherapy., Disp: 30 tablet, Rfl: 1   Polyethylene Glycol 400 (BLINK TEARS) 0.25 % SOLN, Apply 1 drop to eye 5 (five) times daily as needed. Every 2 hrs daily, Disp: , Rfl:    prochlorperazine (COMPAZINE) 10 MG tablet, Take 1 tablet (10 mg total) by mouth every 6 (six) hours as needed (Nausea or vomiting)., Disp: 30  tablet, Rfl: 1   triamcinolone ointment (KENALOG) 0.5 %, Apply 1 application topically 2 (two) times daily., Disp: 30 g, Rfl: 0   vitamin B-12 (CYANOCOBALAMIN) 1000 MCG tablet, Take 1,000 mcg by mouth daily., Disp: , Rfl:    vitamin C (ASCORBIC ACID) 500 MG tablet, Take 500 mg by mouth daily., Disp: , Rfl:    Adalimumab (HUMIRA) 40 MG/0.8ML PSKT, Inject 40 mg into the skin every 14 (fourteen) days., Disp: , Rfl:   Physical exam:  Vitals:   09/20/20 0841  BP: 106/67  Pulse: (!) 101  Resp: 16  Temp: 99.5 F (37.5 C)  TempSrc: Tympanic  SpO2: 97%  Weight: 169 lb 6 oz (76.8 kg)   Physical Exam Cardiovascular:     Rate and Rhythm: Regular rhythm.     Heart sounds: Normal heart sounds.  Pulmonary:     Effort: Pulmonary effort  is normal.     Breath sounds: Normal breath sounds.  Abdominal:     General: Bowel sounds are normal.     Palpations: Abdomen is soft.  Skin:    General: Skin is warm and dry.  Neurological:     Mental Status: She is alert and oriented to person, place, and time.  Psychiatric:        Mood and Affect: Mood normal.     CMP Latest Ref Rng & Units 09/10/2020  Glucose 70 - 99 mg/dL 161(H)  BUN 8 - 23 mg/dL 14  Creatinine 0.44 - 1.00 mg/dL 0.77  Sodium 135 - 145 mmol/L 137  Potassium 3.5 - 5.1 mmol/L 3.7  Chloride 98 - 111 mmol/L 104  CO2 22 - 32 mmol/L 23  Calcium 8.9 - 10.3 mg/dL 9.3  Total Protein 6.5 - 8.1 g/dL 7.0  Total Bilirubin 0.3 - 1.2 mg/dL 0.7  Alkaline Phos 38 - 126 U/L 93  AST 15 - 41 U/L 32  ALT 0 - 44 U/L 35   CBC Latest Ref Rng & Units 09/10/2020  WBC 4.0 - 10.5 K/uL 9.6  Hemoglobin 12.0 - 15.0 g/dL 10.6(L)  Hematocrit 36.0 - 46.0 % 33.1(L)  Platelets 150 - 400 K/uL 335    No images are attached to the encounter.  No results found.   Assessment and plan- Patient is a 65 y.o. 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    Patient received cycle 3 TCHP chemotherapy on 09/10/2020 and returns to clinic today for assessment and consideration of IV fluids.  Patient seems to be doing well without significant symptomatic burden today.  Her appetite is poor and weight is down slightly today.  Suggested oral nutritional supplements.  Also recommended nutrition consult but patient declined this.  We will proceed with IV fluids today.  Discussed ongoing supportive care as needed. Patient to return to see Dr. Janese Banks on 7/21 for cycle 4 TCHP chemotherapy.   Case and plan discussed with Dr. Janese Banks   Visit Diagnosis 1. Malignant neoplasm of upper-outer quadrant of left breast in female, estrogen receptor positive (HCC)      Altha Harm, PhD, NP-C [facility] at [facility] [phone] 09/20/2020 8:45 AM

## 2020-09-20 NOTE — Addendum Note (Signed)
Addended by: Irean Hong on: 09/20/2020 09:25 AM   Modules accepted: Orders

## 2020-09-24 ENCOUNTER — Encounter: Payer: Self-pay | Admitting: Oncology

## 2020-09-24 ENCOUNTER — Other Ambulatory Visit: Payer: Self-pay | Admitting: *Deleted

## 2020-09-24 MED ORDER — CLARITIN-D 24 HOUR 10-240 MG PO TB24: 1.0000 | ORAL_TABLET | Freq: Every day | ORAL | 0 refills | Status: DC

## 2020-09-24 MED ORDER — OMEPRAZOLE 40 MG PO CPDR: 40.0000 mg | DELAYED_RELEASE_CAPSULE | Freq: Every day | ORAL | 0 refills | Status: DC

## 2020-09-28 ENCOUNTER — Ambulatory Visit
Admission: RE | Admit: 2020-09-28 | Discharge: 2020-09-28 | Disposition: A | Discharge status: Home / Self Care | Payer: BC Managed Care – PPO | Source: Ambulatory Visit | Attending: Oncology | Admitting: Oncology

## 2020-09-28 ENCOUNTER — Other Ambulatory Visit: Payer: Self-pay

## 2020-09-28 DIAGNOSIS — C50412 Malignant neoplasm of upper-outer quadrant of left female breast: Secondary | ICD-10-CM

## 2020-09-28 DIAGNOSIS — Z170 Estrogen receptor positive status [ER+]: Secondary | ICD-10-CM | POA: Diagnosis not present

## 2020-09-28 DIAGNOSIS — N6002 Solitary cyst of left breast: Secondary | ICD-10-CM | POA: Diagnosis not present

## 2020-09-28 DIAGNOSIS — Z5111 Encounter for antineoplastic chemotherapy: Secondary | ICD-10-CM | POA: Insufficient documentation

## 2020-09-30 ENCOUNTER — Other Ambulatory Visit: Payer: Self-pay | Admitting: *Deleted

## 2020-09-30 ENCOUNTER — Ambulatory Visit: Payer: BC Managed Care – PPO

## 2020-09-30 ENCOUNTER — Encounter: Payer: Self-pay | Admitting: Oncology

## 2020-09-30 ENCOUNTER — Inpatient Hospital Stay: Payer: BC Managed Care – PPO

## 2020-09-30 ENCOUNTER — Inpatient Hospital Stay (HOSPITAL_BASED_OUTPATIENT_CLINIC_OR_DEPARTMENT_OTHER): Payer: BC Managed Care – PPO | Admitting: Oncology

## 2020-09-30 ENCOUNTER — Ambulatory Visit: Payer: BC Managed Care – PPO | Admitting: Oncology

## 2020-09-30 ENCOUNTER — Other Ambulatory Visit: Payer: BC Managed Care – PPO

## 2020-09-30 VITALS — BP 114/78 | HR 95 | Temp 99.0°F | Resp 18 | Wt 173.9 lb

## 2020-09-30 DIAGNOSIS — Z170 Estrogen receptor positive status [ER+]: Secondary | ICD-10-CM | POA: Diagnosis not present

## 2020-09-30 DIAGNOSIS — Z5111 Encounter for antineoplastic chemotherapy: Secondary | ICD-10-CM | POA: Diagnosis not present

## 2020-09-30 DIAGNOSIS — R7989 Other specified abnormal findings of blood chemistry: Secondary | ICD-10-CM

## 2020-09-30 DIAGNOSIS — R6889 Other general symptoms and signs: Secondary | ICD-10-CM | POA: Diagnosis not present

## 2020-09-30 DIAGNOSIS — Z5112 Encounter for antineoplastic immunotherapy: Secondary | ICD-10-CM

## 2020-09-30 DIAGNOSIS — C50412 Malignant neoplasm of upper-outer quadrant of left female breast: Secondary | ICD-10-CM

## 2020-09-30 DIAGNOSIS — D6481 Anemia due to antineoplastic chemotherapy: Secondary | ICD-10-CM

## 2020-09-30 DIAGNOSIS — T451X5A Adverse effect of antineoplastic and immunosuppressive drugs, initial encounter: Secondary | ICD-10-CM

## 2020-09-30 DIAGNOSIS — Z79899 Other long term (current) drug therapy: Secondary | ICD-10-CM | POA: Diagnosis not present

## 2020-09-30 LAB — CBC WITH DIFFERENTIAL/PLATELET
Abs Immature Granulocytes: 0.05 10*3/uL (ref 0.00–0.07)
Basophils Absolute: 0 10*3/uL (ref 0.0–0.1)
Basophils Relative: 0 %
Eosinophils Absolute: 0 10*3/uL (ref 0.0–0.5)
Eosinophils Relative: 0 %
HCT: 31.4 % — ABNORMAL LOW (ref 36.0–46.0)
Hemoglobin: 10 g/dL — ABNORMAL LOW (ref 12.0–15.0)
Immature Granulocytes: 1 %
Lymphocytes Relative: 18 %
Lymphs Abs: 1.7 10*3/uL (ref 0.7–4.0)
MCH: 21.1 pg — ABNORMAL LOW (ref 26.0–34.0)
MCHC: 31.8 g/dL (ref 30.0–36.0)
MCV: 66.1 fL — ABNORMAL LOW (ref 80.0–100.0)
Monocytes Absolute: 0.3 10*3/uL (ref 0.1–1.0)
Monocytes Relative: 3 %
Neutro Abs: 7.4 10*3/uL (ref 1.7–7.7)
Neutrophils Relative %: 78 %
Platelets: 271 10*3/uL (ref 150–400)
RBC: 4.75 MIL/uL (ref 3.87–5.11)
RDW: 18.4 % — ABNORMAL HIGH (ref 11.5–15.5)
WBC: 9.5 10*3/uL (ref 4.0–10.5)
nRBC: 0 % (ref 0.0–0.2)

## 2020-09-30 LAB — IRON AND TIBC
Iron: 88 ug/dL (ref 28–170)
Saturation Ratios: 25 % (ref 10.4–31.8)
TIBC: 347 ug/dL (ref 250–450)
UIBC: 259 ug/dL

## 2020-09-30 LAB — COMPREHENSIVE METABOLIC PANEL
ALT: 35 U/L (ref 0–44)
AST: 36 U/L (ref 15–41)
Albumin: 3.8 g/dL (ref 3.5–5.0)
Alkaline Phosphatase: 97 U/L (ref 38–126)
Anion gap: 11 (ref 5–15)
BUN: 13 mg/dL (ref 8–23)
CO2: 24 mmol/L (ref 22–32)
Calcium: 9 mg/dL (ref 8.9–10.3)
Chloride: 104 mmol/L (ref 98–111)
Creatinine, Ser: 0.67 mg/dL (ref 0.44–1.00)
GFR, Estimated: >60 mL/min (ref >60)
Glucose, Bld: 140 mg/dL — ABNORMAL HIGH (ref 70–99)
Potassium: 3.7 mmol/L (ref 3.5–5.1)
Sodium: 139 mmol/L (ref 135–145)
Total Bilirubin: 0.8 mg/dL (ref 0.3–1.2)
Total Protein: 6.8 g/dL (ref 6.5–8.1)

## 2020-09-30 LAB — FOLATE: Folate: 24 ng/mL (ref >5.9)

## 2020-09-30 LAB — FERRITIN: Ferritin: 196 ng/mL (ref 11–307)

## 2020-09-30 LAB — TSH: TSH: 0.155 u[IU]/mL — ABNORMAL LOW (ref 0.350–4.500)

## 2020-09-30 LAB — VITAMIN B12: Vitamin B-12: 3510 pg/mL — ABNORMAL HIGH (ref 180–914)

## 2020-09-30 MED ORDER — SODIUM CHLORIDE 0.9 % IV SOLN
580.0000 mg | Freq: Once | INTRAVENOUS | Status: AC
  Administered 2020-09-30: 580 mg via INTRAVENOUS
  Filled 2020-09-30: qty 58

## 2020-09-30 MED ORDER — SODIUM CHLORIDE 0.9 % IV SOLN
420.0000 mg | Freq: Once | INTRAVENOUS | Status: AC
  Administered 2020-09-30: 420 mg via INTRAVENOUS
  Filled 2020-09-30: qty 14

## 2020-09-30 MED ORDER — TRASTUZUMAB-DKST CHEMO 150 MG IV SOLR
450.0000 mg | Freq: Once | INTRAVENOUS | Status: AC
  Administered 2020-09-30: 450 mg via INTRAVENOUS
  Filled 2020-09-30: qty 21.43

## 2020-09-30 MED ORDER — DIPHENHYDRAMINE HCL 25 MG PO CAPS
50.0000 mg | ORAL_CAPSULE | Freq: Once | ORAL | Status: AC
  Administered 2020-09-30: 50 mg via ORAL
  Filled 2020-09-30: qty 2

## 2020-09-30 MED ORDER — SODIUM CHLORIDE 0.9 % IV SOLN
150.0000 mg | Freq: Once | INTRAVENOUS | Status: AC
  Administered 2020-09-30: 150 mg via INTRAVENOUS
  Filled 2020-09-30: qty 150

## 2020-09-30 MED ORDER — PALONOSETRON HCL INJECTION 0.25 MG/5ML
0.2500 mg | Freq: Once | INTRAVENOUS | Status: AC
  Administered 2020-09-30: 0.25 mg via INTRAVENOUS
  Filled 2020-09-30: qty 5

## 2020-09-30 MED ORDER — PEGFILGRASTIM 6 MG/0.6ML ~~LOC~~ PSKT
6.0000 mg | PREFILLED_SYRINGE | Freq: Once | SUBCUTANEOUS | Status: AC
  Administered 2020-09-30: 6 mg via SUBCUTANEOUS
  Filled 2020-09-30: qty 0.6

## 2020-09-30 MED ORDER — HEPARIN SOD (PORK) LOCK FLUSH 100 UNIT/ML IV SOLN
INTRAVENOUS | Status: AC
  Filled 2020-09-30: qty 5

## 2020-09-30 MED ORDER — DEXAMETHASONE 4 MG PO TABS: 8.0000 mg | ORAL_TABLET | Freq: Two times a day (BID) | ORAL | 2 refills | Status: DC

## 2020-09-30 MED ORDER — SODIUM CHLORIDE 0.9 % IV SOLN
Freq: Once | INTRAVENOUS | Status: AC
Start: 2020-09-30 — End: 2020-09-30
  Filled 2020-09-30: qty 250

## 2020-09-30 MED ORDER — HEPARIN SOD (PORK) LOCK FLUSH 100 UNIT/ML IV SOLN
500.0000 [IU] | Freq: Once | INTRAVENOUS | Status: AC | PRN
  Administered 2020-09-30: 500 [IU]
  Filled 2020-09-30: qty 5

## 2020-09-30 MED ORDER — ACETAMINOPHEN 325 MG PO TABS
650.0000 mg | ORAL_TABLET | Freq: Once | ORAL | Status: AC
  Administered 2020-09-30: 650 mg via ORAL
  Filled 2020-09-30: qty 2

## 2020-09-30 MED ORDER — SODIUM CHLORIDE 0.9 % IV SOLN
10.0000 mg | Freq: Once | INTRAVENOUS | Status: AC
  Administered 2020-09-30: 10 mg via INTRAVENOUS
  Filled 2020-09-30: qty 10

## 2020-09-30 MED ORDER — SODIUM CHLORIDE 0.9 % IV SOLN
75.0000 mg/m2 | Freq: Once | INTRAVENOUS | Status: AC
  Administered 2020-09-30: 140 mg via INTRAVENOUS
  Filled 2020-09-30: qty 14

## 2020-09-30 NOTE — Progress Notes (Signed)
Has diarrhea after treatments. Had 3 nosebleeds about 3 days ago.Hasn't happened again. No taste with foods. Appetite is fair. Denies pain. Transient mild neuropathy. Fatigue up and down.

## 2020-09-30 NOTE — Patient Instructions (Signed)
[facility] ONCOLOG[facility]  Discharge Instructions: Thank you for choosing [facility] to provide your oncology and hematology care.  If you have a lab appointment with the [facility], please go directly to the [facility] and check in at the registration area.  Wear comfortable clothing and clothing appropriate for easy access to any Portacath or PICC line.   We strive to give you quality time with your provider. You may need to reschedule your appointment if you arrive late (15 or more minutes).  Arriving late affects you and other patients whose appointments are after yours.  Also, if you miss three or more appointments without notifying the office, you may be dismissed from the clinic at the provider's discretion.      For prescription refill requests, have your pharmacy contact our office and allow 72 hours for refills to be completed.    Today you received the following chemotherapy and/or immunotherapy agents Ogivri, Perjeta, Taxotere and Carboplatin       To help prevent nausea and vomiting after your treatment, we encourage you to take your nausea medication as directed.  BELOW ARE SYMPTOMS THAT SHOULD BE REPORTED IMMEDIATELY: *FEVER GREATER THAN 100.4 F (38 C) OR HIGHER *CHILLS OR SWEATING *NAUSEA AND VOMITING THAT IS NOT CONTROLLED WITH YOUR NAUSEA MEDICATION *UNUSUAL SHORTNESS OF BREATH *UNUSUAL BRUISING OR BLEEDING *URINARY PROBLEMS (pain or burning when urinating, or frequent urination) *BOWEL PROBLEMS (unusual diarrhea, constipation, pain near the anus) TENDERNESS IN MOUTH AND THROAT WITH OR WITHOUT PRESENCE OF ULCERS (sore throat, sores in mouth, or a toothache) UNUSUAL RASH, SWELLING OR PAIN  UNUSUAL VAGINAL DISCHARGE OR ITCHING   Items with * indicate a potential emergency and should be followed up as soon as possible or go to the Emergency Department if any problems should occur.  Please show the CHEMOTHERAPY ALERT CARD or  IMMUNOTHERAPY ALERT CARD at check-in to the Emergency Department and triage nurse.  Should you have questions after your visit or need to cancel or reschedule your appointment, please contact [facility]  [phone] and follow the prompts.  Office hours are 8:00 a.m. to 4:30 p.m. Monday - Friday. Please note that voicemails left after 4:00 p.m. may not be returned until the following business day.  We are closed weekends and major holidays. You have access to a nurse at all times for urgent questions. Please call the main number to the clinic [phone] and follow the prompts.  For any non-urgent questions, you may also contact your provider using MyChart. We now offer e-Visits for anyone 63 and older to request care online for non-urgent symptoms. For details visit [email or web address].   Also download the MyChart app! Go to the app store, search "MyChart", open the app, select [facility], and log in with your MyChart username and password.  Due to Covid, a mask is required upon entering the hospital/clinic. If you do not have a mask, one will be given to you upon arrival. For doctor visits, patients may have 1 support person aged 63 or older with them. For treatment visits, patients cannot have anyone with them due to current Covid guidelines and our immunocompromised population.

## 2020-09-30 NOTE — Progress Notes (Signed)
Hematology/Oncology Consult note [facility]  Telephone:([phone] Fax:([phone]  Patient Care Team: Kirk Ruths, MD as PCP - General (Internal Medicine) Rico Junker, RN as Oncology Nurse Navigator   Name of the patient: Wendy Caldwell  [id number]  12/21/1955   Date of visit: 09/30/20  Diagnosis-  left breast cancer clinical prognostic stage IIA cT2 N0 M0 ER weakly positive, PR negative HER2 positive    Chief complaint/ Reason for visit-on treatment assessment prior to cycle 4 of neoadjuvant TCHP chemotherapy  Heme/Onc history: Patient is a 65 year old female with a past medical history significant for psoriasis for which she is on Humira.  She self palpated a left breast mass which prompted a bilateral diagnostic mammogram on 06/25/2020.  That showed an irregular hypoechoic mass 2.5 x 1.7 x 1.8 cm at 1 o'clock position 1 cm from the nipple.  3 cm from the nipple were benign cysts.  1 mildly enlarged left axillary lymph node with cortical thickening 4 mm.  Both the breast mass and the lymph node were biopsied.  Left breast biopsy was consistent with invasive mammary carcinoma grade 3 ER weakly +1 to 10%, PR negative and HER2 positive by IHC.   Plan is to proceed with 6 cycles of neoadjuvant TCHP chemotherapy followed by surgery   Patient has baseline microcytosis with a hemoglobin of around 12.  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    Interval history-she is tolerating chemotherapy well so far.  She does have some self-limited diarrhea which lasts for 2 to 3 days after chemotherapy.  Energy levels and appetite are normal  ECOG PS- 1 Pain scale- 0   Review of systems- Review of Systems  Constitutional:  Positive for malaise/fatigue. Negative for chills, fever and weight loss.  HENT:  Negative for congestion, ear discharge and  nosebleeds.   Eyes:  Negative for blurred vision.  Respiratory:  Negative for cough, hemoptysis, sputum production, shortness of breath and wheezing.   Cardiovascular:  Negative for chest pain, palpitations, orthopnea and claudication.  Gastrointestinal:  Positive for diarrhea. Negative for abdominal pain, blood in stool, constipation, heartburn, melena, nausea and vomiting.  Genitourinary:  Negative for dysuria, flank pain, frequency, hematuria and urgency.  Musculoskeletal:  Negative for back pain, joint pain and myalgias.  Skin:  Negative for rash.  Neurological:  Negative for dizziness, tingling, focal weakness, seizures, weakness and headaches.  Endo/Heme/Allergies:  Does not bruise/bleed easily.  Psychiatric/Behavioral:  Negative for depression and suicidal ideas. The patient does not have insomnia.      No Known Allergies   Past Medical History:  Diagnosis Date   Family history of adverse reaction to anesthesia    adopted   GERD (gastroesophageal reflux disease)      Past Surgical History:  Procedure Laterality Date   BREAST BIOPSY Left 07/05/2020   [place] Bx, Q clip, path pending   BREAST BIOPSY Left 07/05/2020   [place] Bx, Vision, path pending    CARPAL TUNNEL RELEASE Bilateral    CHOLECYSTECTOMY     GANGLION CYST EXCISION     wrist   PORTACATH PLACEMENT Right 07/21/2020   Procedure: INSERTION PORT-A-CATH;  Surgeon: Herbert Pun, MD;  Location: [facility] ORS;  Service: General;  Laterality: Right;   right ovary removal      Social History   Socioeconomic History   Marital status: Married    Spouse name: Not on  file   Number of children: Not on file   Years of education: Not on file   Highest education level: Not on file  Occupational History   Not on file  Tobacco Use   Smoking status: Former    Types: Cigarettes    Start date: 2012   Smokeless tobacco: Never  Vaping Use   Vaping Use: Never used  Substance and Sexual Activity   Alcohol use: Yes     Comment: occassional    Drug use: Never   Sexual activity: Not on file  Other Topics Concern   Not on file  Social History Narrative   Not on file   Social Determinants of Health   Financial Resource Strain: Not on file  Food Insecurity: Not on file  Transportation Needs: Not on file  Physical Activity: Not on file  Stress: Not on file  Social Connections: Not on file  Intimate Partner Violence: Not on file    Family History  Adopted: Yes  Problem Relation Age of Onset   Breast cancer Neg Hx      Current Outpatient Medications:    lidocaine-prilocaine (EMLA) cream, Apply 1 application topically daily as needed (prior to port access)., Disp: 30 g, Rfl: 2   loratadine (CLARITIN) 10 MG tablet, Take 10 mg by mouth daily., Disp: , Rfl:    loratadine-pseudoephedrine (CLARITIN-D 24 HOUR) 10-240 MG 24 hr tablet, Take 1 tablet by mouth daily., Disp: 30 tablet, Rfl: 0   LORazepam (ATIVAN) 0.5 MG tablet, Take 1 tablet (0.5 mg total) by mouth every 6 (six) hours as needed (Nausea or vomiting)., Disp: 30 tablet, Rfl: 0   Multiple Vitamins-Minerals (VITEYES AREDS FORMULA/LUTEIN) CAPS, Take by mouth., Disp: , Rfl:    omeprazole (PRILOSEC) 40 MG capsule, Take 1 capsule (40 mg total) by mouth daily., Disp: 30 capsule, Rfl: 0   Polyethylene Glycol 400 (BLINK TEARS) 0.25 % SOLN, Apply 1 drop to eye 5 (five) times daily as needed. Every 2 hrs daily, Disp: , Rfl:    triamcinolone ointment (KENALOG) 0.5 %, Apply 1 application topically 2 (two) times daily., Disp: 30 g, Rfl: 0   vitamin B-12 (CYANOCOBALAMIN) 1000 MCG tablet, Take 1,000 mcg by mouth daily., Disp: , Rfl:    vitamin C (ASCORBIC ACID) 500 MG tablet, Take 500 mg by mouth daily., Disp: , Rfl:    vitamin E 180 MG (400 UNITS) capsule, Take 400 Units by mouth daily., Disp: , Rfl:    dexamethasone (DECADRON) 4 MG tablet, Take 2 tablets (8 mg total) by mouth 2 (two) times daily. Start the day before Taxotere. Then take daily x 3 days after  chemotherapy., Disp: 30 tablet, Rfl: 2   ondansetron (ZOFRAN) 8 MG tablet, Take 1 tablet (8 mg total) by mouth 2 (two) times daily as needed (Nausea or vomiting). Start on the third day after chemotherapy. (Patient not taking: Reported on 09/30/2020), Disp: 30 tablet, Rfl: 1   prochlorperazine (COMPAZINE) 10 MG tablet, Take 1 tablet (10 mg total) by mouth every 6 (six) hours as needed (Nausea or vomiting). (Patient not taking: Reported on 09/30/2020), Disp: 30 tablet, Rfl: 1 No current facility-administered medications for this visit.  Facility-Administered Medications Ordered in Other Visits:    CARBOplatin (PARAPLATIN) 580 mg in sodium chloride 0.9 % 250 mL chemo infusion, 580 mg, Intravenous, Once, Sindy Guadeloupe, MD   DOCEtaxel (TAXOTERE) 140 mg in sodium chloride 0.9 % 250 mL chemo infusion, 75 mg/m2 (Order-Specific), Intravenous, Once, Sindy Guadeloupe, MD  fosaprepitant (EMEND) 150 mg in sodium chloride 0.9 % 145 mL IVPB, 150 mg, Intravenous, Once, Sindy Guadeloupe, MD   heparin lock flush 100 unit/mL, 500 Units, Intracatheter, Once PRN, Sindy Guadeloupe, MD   pegfilgrastim (NEULASTA ONPRO KIT) injection 6 mg, 6 mg, Subcutaneous, Once, Sindy Guadeloupe, MD   pertuzumab (PERJETA) 420 mg in sodium chloride 0.9 % 250 mL chemo infusion, 420 mg, Intravenous, Once, Sindy Guadeloupe, MD   trastuzumab-dkst (OGIVRI) 450 mg in sodium chloride 0.9 % 250 mL chemo infusion, 450 mg, Intravenous, Once, Sindy Guadeloupe, MD  Physical exam:  Vitals:   09/30/20 0831  BP: 114/78  Pulse: 95  Resp: 18  Temp: 99 F (37.2 C)  TempSrc: Tympanic  SpO2: 98%  Weight: 173 lb 14.4 oz (78.9 kg)   Physical Exam Cardiovascular:     Rate and Rhythm: Regular rhythm. Tachycardia present.     Heart sounds: Normal heart sounds.  Pulmonary:     Effort: Pulmonary effort is normal.  Skin:    General: Skin is warm and dry.  Neurological:     Mental Status: She is alert and oriented to person, place, and time.     CMP Latest  Ref Rng & Units 09/30/2020  Glucose 70 - 99 mg/dL 140(H)  BUN 8 - 23 mg/dL 13  Creatinine 0.44 - 1.00 mg/dL 0.67  Sodium 135 - 145 mmol/L 139  Potassium 3.5 - 5.1 mmol/L 3.7  Chloride 98 - 111 mmol/L 104  CO2 22 - 32 mmol/L 24  Calcium 8.9 - 10.3 mg/dL 9.0  Total Protein 6.5 - 8.1 g/dL 6.8  Total Bilirubin 0.3 - 1.2 mg/dL 0.8  Alkaline Phos 38 - 126 U/L 97  AST 15 - 41 U/L 36  ALT 0 - 44 U/L 35   CBC Latest Ref Rng & Units 09/30/2020  WBC 4.0 - 10.5 K/uL 9.5  Hemoglobin 12.0 - 15.0 g/dL 10.0(L)  Hematocrit 36.0 - 46.0 % 31.4(L)  Platelets 150 - 400 K/uL 271    No images are attached to the encounter.  US Breast Limited Uni Left Inc Axilla  Result Date: 09/28/2020 CLINICAL DATA:  65 year old female diagnosed with invasive mammary carcinoma in the retroareolar left breast presenting for interim evaluation as the patient is undergoing neoadjuvant chemotherapy. She had MRI guided biopsies of the bilateral breasts 08/12/2020 with pathology results demonstrating benign breast tissue with increased stromal fibrosis and patchy periductal chronic inflammation of the upper-outer quadrant of the left breast, and benign breast tissue with increased stromal fibrosis of the retroareolar right breast. EXAM: ULTRASOUND OF THE LEFT BREAST COMPARISON:  Previous exam(s). FINDINGS: Ultrasound targeted to the retroareolar left breast at 1 o'clock, 1 cm from the nipple demonstrates the defined irregular mass has mildly decreased in size measuring 2.5 x 1.3 x 1.2 cm. No mass previously measured 2.5 x 1.7 x 1.8 cm on ultrasound 06/25/2020. In the left breast at 1 o'clock, 2 cm from the nipple there is an oval hypoechoic mass measuring 0.9 x 0.6 x 0.8 cm. This corresponds well in location, size and shape with the mass identified and biopsied under MRI guidance, which was benign. Near this mass is a 0.6 cm benign cyst, which can also be seen on the MRI. IMPRESSION: Mild decrease in size of the known left breast cancer  at 1 o'clock. RECOMMENDATION: Continue treatment plan for known left breast cancer. I have discussed the findings and recommendations with the patient. If applicable, a reminder letter will be sent  to the patient regarding the next appointment. BI-RADS CATEGORY  6: Known biopsy-proven malignancy. Electronically Signed   By: Ammie Ferrier M.D.   On: 09/28/2020 13:47    Assessment and plan- Patient is a 65 y.o. 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  She is here for on treatment assessment prior to cycle 4 of neoadjuvant TCHP chemotherapy  Counts okay to proceed with cycle 4 of neoadjuvant TCHP chemotherapy today with on pro Neulasta support. Patient has baseline microcytosis but her hemoglobin was closer to 12 before starting chemotherapy and now has drifted down to 10.  We have checked iron studies B12 folate and TSH today which are pending.  Discussed the results of interim ultrasound which show some improvement in the size of the left primary left breast mass Down from 2.5 x 1.7 x 1.8 cm and 2.5 x 1.3 x 1.2 cm.  It is not as significant as I would like it to be but nevertheless there is some shrinkage and definitely no further growth.  I plan is therefore to complete 3 more cycles of TCHP chemotherapy followed by definitive surgery.  Patient is leaning towards a lumpectomy but is concerned about receiving radiation treatment and potential side effects.  She would therefore like to speak to radiation oncology at this time and weigh her options if she wants to proceed with lumpectomy plus radiation versus mastectomy.  I will refer her to radiation oncology for the same.  I will get a repeat echocardiogram in 2 weeks time and see her back in 3 weeks on a Tuesday for cycle 5 of neoadjuvant TCHP chemotherapy     Visit Diagnosis 1. Encounter for antineoplastic chemotherapy   2. Malignant  neoplasm of upper-outer quadrant of left breast in female, estrogen receptor positive ([place])   3. Encounter for monoclonal antibody treatment for malignancy   4. Antineoplastic chemotherapy induced anemia      Dr. Randa Evens, MD, MPH [facility] at [facility] [phone] 09/30/2020 9:37 AM

## 2020-10-04 ENCOUNTER — Other Ambulatory Visit: Payer: BC Managed Care – PPO

## 2020-10-04 ENCOUNTER — Ambulatory Visit: Payer: BC Managed Care – PPO

## 2020-10-04 ENCOUNTER — Ambulatory Visit: Payer: BC Managed Care – PPO | Admitting: Oncology

## 2020-10-05 ENCOUNTER — Encounter: Payer: Self-pay | Admitting: General Surgery

## 2020-10-07 ENCOUNTER — Encounter: Payer: Self-pay | Admitting: Radiation Oncology

## 2020-10-07 ENCOUNTER — Ambulatory Visit
Admission: RE | Admit: 2020-10-07 | Discharge: 2020-10-07 | Disposition: A | Discharge status: Home / Self Care | Payer: BC Managed Care – PPO | Source: Ambulatory Visit | Attending: Radiation Oncology | Admitting: Radiation Oncology

## 2020-10-07 VITALS — BP 123/81 | HR 99

## 2020-10-07 DIAGNOSIS — C50412 Malignant neoplasm of upper-outer quadrant of left female breast: Secondary | ICD-10-CM | POA: Diagnosis not present

## 2020-10-07 DIAGNOSIS — Z79899 Other long term (current) drug therapy: Secondary | ICD-10-CM | POA: Diagnosis not present

## 2020-10-07 DIAGNOSIS — R590 Localized enlarged lymph nodes: Secondary | ICD-10-CM | POA: Insufficient documentation

## 2020-10-07 DIAGNOSIS — K219 Gastro-esophageal reflux disease without esophagitis: Secondary | ICD-10-CM | POA: Diagnosis not present

## 2020-10-07 DIAGNOSIS — Z87891 Personal history of nicotine dependence: Secondary | ICD-10-CM | POA: Diagnosis not present

## 2020-10-07 DIAGNOSIS — Z170 Estrogen receptor positive status [ER+]: Secondary | ICD-10-CM | POA: Insufficient documentation

## 2020-10-07 NOTE — Consult Note (Signed)
NEW PATIENT EVALUATION  Name: Wendy Caldwell  MRN: [id number]  Date:   10/07/2020     DOB: 1955-04-09   This 65 y.o. 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   PREVIOUS INVESTIGATIONS:  Mammogram ultrasound and MRI scans reviewed Clinical notes reviewed Pathology report reviewed  HPI: Patient is a 65 year old female self discovered left breast mass around the nipple areolar complex.  Mammogram confirmed a 2.5 x 1.7 x 1.8 cm mass at 1 o'clock position 1 cm from the nipple.  There was 1 mildly enlarged left axillary lymph node with cortical thickening measuring 4 mm.  She underwent biopsy of both breast and lymph node.  Lymph node was negative breast showed ER weakly PR negative and HER2/neu positive by IHC.  Tumor was a grade 3 invasive mammary carcinoma.  She has been started on neoadjuvant TCHP chemotherapy which she is tolerating fairly well.  She is up 4 of 6 of her cycles.  Patient has questions and is been asked to be seen by radiation oncology for consideration of treatment she currently is doing well.  She is having surgery scheduled after her neoadjuvant chemotherapy.  She has multiple other areas of the right breast as well as left breast biopsied by M mammography and MRI findings all of those areas were negative.  PLANNED TREATMENT REGIMEN: Completion of neoadjuvant chemotherapy followed by wide local excision versus mastectomy  PAST MEDICAL HISTORY:  has a past medical history of Family history of adverse reaction to anesthesia and GERD  (gastroesophageal reflux disease).    PAST SURGICAL HISTORY:  Past Surgical History:  Procedure Laterality Date   BREAST BIOPSY Left 07/05/2020   [place] Bx, Q clip, path pending   BREAST BIOPSY Left 07/05/2020   [place] Bx, Vision, path pending    CARPAL TUNNEL RELEASE Bilateral    CHOLECYSTECTOMY     GANGLION CYST EXCISION     wrist   PORTACATH PLACEMENT Right 07/21/2020   Procedure: INSERTION PORT-A-CATH;  Surgeon: Herbert Pun, MD;  Location: [facility] ORS;  Service: General;  Laterality: Right;   right ovary removal      FAMILY HISTORY: family history is not on file. She was adopted.  SOCIAL HISTORY:  reports that she has quit smoking. Her smoking use included cigarettes. She started smoking about 10 years ago. She has never used smokeless tobacco. She reports current alcohol use. She reports that she does not use drugs.  ALLERGIES: Patient has no known allergies.  MEDICATIONS:  Current Outpatient Medications  Medication Sig Dispense Refill   dexamethasone (DECADRON) 4 MG tablet Take 2 tablets (8 mg total) by mouth 2 (two) times daily. Start the day before Taxotere. Then take daily x 3 days after chemotherapy. 30 tablet 2   lidocaine-prilocaine (EMLA) cream Apply 1 application topically daily as needed (prior to port access). 30 g 2   loratadine (CLARITIN) 10 MG tablet Take 10 mg by mouth daily.     loratadine-pseudoephedrine (CLARITIN-D 24 HOUR) 10-240 MG 24 hr tablet Take 1 tablet by mouth daily. 30 tablet 0   LORazepam (ATIVAN) 0.5 MG tablet Take 1 tablet (0.5  mg total) by mouth every 6 (six) hours as needed (Nausea or vomiting). 30 tablet 0   Multiple Vitamins-Minerals (VITEYES AREDS FORMULA/LUTEIN) CAPS Take by mouth.     omeprazole (PRILOSEC) 40 MG capsule Take 1 capsule (40 mg total) by mouth daily. 30 capsule 0   ondansetron (ZOFRAN) 8 MG tablet Take 1 tablet (8 mg total) by mouth 2 (two) times daily as needed (Nausea or vomiting). Start on the third day after chemotherapy.  (Patient not taking: Reported on 09/30/2020) 30 tablet 1   Polyethylene Glycol 400 (BLINK TEARS) 0.25 % SOLN Apply 1 drop to eye 5 (five) times daily as needed. Every 2 hrs daily     prochlorperazine (COMPAZINE) 10 MG tablet Take 1 tablet (10 mg total) by mouth every 6 (six) hours as needed (Nausea or vomiting). (Patient not taking: Reported on 09/30/2020) 30 tablet 1   triamcinolone ointment (KENALOG) 0.5 % Apply 1 application topically 2 (two) times daily. 30 g 0   vitamin B-12 (CYANOCOBALAMIN) 1000 MCG tablet Take 1,000 mcg by mouth daily.     vitamin C (ASCORBIC ACID) 500 MG tablet Take 500 mg by mouth daily.     vitamin E 180 MG (400 UNITS) capsule Take 400 Units by mouth daily.     No current facility-administered medications for this encounter.    ECOG PERFORMANCE STATUS:  0 - Asymptomatic  REVIEW OF SYSTEMS: Patient denies any weight loss, fatigue, weakness, fever, chills or night sweats. Patient denies any loss of vision, blurred vision. Patient denies any ringing  of the ears or hearing loss. No irregular heartbeat. Patient denies heart murmur or history of fainting. Patient denies any chest pain or pain radiating to her upper extremities. Patient denies any shortness of breath, difficulty breathing at night, cough or hemoptysis. Patient denies any swelling in the lower legs. Patient denies any nausea vomiting, vomiting of blood, or coffee ground material in the vomitus. Patient denies any stomach pain. Patient states has had normal bowel movements no significant constipation or diarrhea. Patient denies any dysuria, hematuria or significant nocturia. Patient denies any problems walking, swelling in the joints or loss of balance. Patient denies any skin changes, loss of hair or loss of weight. Patient denies any excessive worrying or anxiety or significant depression. Patient denies any problems with insomnia. Patient denies excessive thirst, polyuria, polydipsia. Patient denies any swollen  glands, patient denies easy bruising or easy bleeding. Patient denies any recent infections, allergies or URI. Patient "s visual fields have not changed significantly in recent time.   PHYSICAL EXAM: BP 123/81   Pulse 99  There is some subtle thickening in the region of her previous palpable left breast mass contiguous with the nipple areolar complex.  No other dominant masses noted in either breast.  No axillary or supraclavicular adenopathy is appreciated.  Well-developed well-nourished patient in NAD. HEENT reveals PERLA, EOMI, discs not visualized.  Oral cavity is clear. No oral mucosal lesions are identified. Neck is clear without evidence of cervical or supraclavicular adenopathy. Lungs are clear to A&P. Cardiac examination is essentially unremarkable with regular rate and rhythm without murmur rub or thrill. Abdomen is benign with no organomegaly or masses noted. Motor sensory and DTR levels are equal and symmetric in the upper and lower extremities. Cranial nerves II through XII are grossly intact. Proprioception is intact. No peripheral adenopathy or edema is identified. No motor or sensory levels are noted. Crude visual fields are within normal range.  LABORATORY DATA: Pathology report reviewed  RADIOLOGY RESULTS: Ultrasound mammograms and MRI scans reviewed compatible with above-stated findings   IMPRESSION: Stage IIa ER weakly positive PR negative and HER2/neu overexpressed invasive mammary carcinoma the left breast currently undergoing neoadjuvant chemotherapy.  In 65 year old female  PLAN: At this time I have gone over the risks and benefits of radiation therapy as an adjunct to lumpectomy and sentinel lymph node biopsy.  Risks and benefits of radiation including skin reaction fatigue alteration of blood counts possible inclusion of superficial lung all were discussed in detail with the patient and her husband.  I have gone over the outcomes of mastectomy versus lumpectomy and  radiation as being equivalent if not superior in overall survival for lumpectomy and radiation.  I have left up the patient to contact me after her surgery when I can review her formal pathology report and make further recommendations.  She continues 2 more cycles of neoadjuvant chemotherapy at this time under Dr. Elroy Channel direction.  I be happy to answerany further questions with the patient.  I would like to take this opportunity to thank you for allowing me to participate in the care of your patient.Noreene Filbert, MD

## 2020-10-07 NOTE — Addendum Note (Signed)
Encounter addended by: Noreene Filbert, MD on: 10/07/2020 2:29 PM  Actions taken: Level of Service modified

## 2020-10-09 ENCOUNTER — Encounter: Payer: Self-pay | Admitting: Oncology

## 2020-10-11 ENCOUNTER — Telehealth: Payer: Self-pay | Admitting: *Deleted

## 2020-10-11 ENCOUNTER — Inpatient Hospital Stay: Payer: BC Managed Care – PPO | Attending: Oncology

## 2020-10-11 ENCOUNTER — Encounter: Payer: Self-pay | Admitting: Oncology

## 2020-10-11 VITALS — BP 112/64 | HR 88 | Temp 98.2°F | Resp 17

## 2020-10-11 DIAGNOSIS — Z170 Estrogen receptor positive status [ER+]: Secondary | ICD-10-CM | POA: Insufficient documentation

## 2020-10-11 DIAGNOSIS — E860 Dehydration: Secondary | ICD-10-CM

## 2020-10-11 DIAGNOSIS — Z79899 Other long term (current) drug therapy: Secondary | ICD-10-CM | POA: Insufficient documentation

## 2020-10-11 DIAGNOSIS — C50412 Malignant neoplasm of upper-outer quadrant of left female breast: Secondary | ICD-10-CM | POA: Diagnosis not present

## 2020-10-11 DIAGNOSIS — R718 Other abnormality of red blood cells: Secondary | ICD-10-CM | POA: Diagnosis not present

## 2020-10-11 DIAGNOSIS — R300 Dysuria: Secondary | ICD-10-CM

## 2020-10-11 DIAGNOSIS — R5383 Other fatigue: Secondary | ICD-10-CM | POA: Insufficient documentation

## 2020-10-11 DIAGNOSIS — R21 Rash and other nonspecific skin eruption: Secondary | ICD-10-CM | POA: Diagnosis not present

## 2020-10-11 LAB — URINALYSIS, COMPLETE (UACMP) WITH MICROSCOPIC
Bacteria, UA: NONE SEEN
Bilirubin Urine: NEGATIVE
Glucose, UA: NEGATIVE mg/dL
Hgb urine dipstick: NEGATIVE
Ketones, ur: 5 mg/dL — AB
Nitrite: NEGATIVE
Protein, ur: NEGATIVE mg/dL
Specific Gravity, Urine: 1.014 (ref 1.005–1.030)
pH: 5 (ref 5.0–8.0)

## 2020-10-11 MED ORDER — SODIUM CHLORIDE 0.9 % IV SOLN
Freq: Once | INTRAVENOUS | Status: AC
  Filled 2020-10-11: qty 250

## 2020-10-11 MED ORDER — SULFAMETHOXAZOLE-TRIMETHOPRIM 800-160 MG PO TABS: 1.0000 | ORAL_TABLET | Freq: Two times a day (BID) | ORAL | 0 refills | Status: DC

## 2020-10-11 MED ORDER — HEPARIN SOD (PORK) LOCK FLUSH 100 UNIT/ML IV SOLN
500.0000 [IU] | Freq: Once | INTRAVENOUS | Status: AC
  Administered 2020-10-11: 500 [IU] via INTRAVENOUS
  Filled 2020-10-11: qty 5

## 2020-10-11 MED ORDER — PREDNISONE 10 MG PO TABS: 10.0000 mg | ORAL_TABLET | Freq: Every day | ORAL | 0 refills | Status: DC

## 2020-10-11 MED ORDER — SODIUM CHLORIDE 0.9% FLUSH
10.0000 mL | INTRAVENOUS | Status: DC | PRN
  Administered 2020-10-11: 10 mL via INTRAVENOUS
  Filled 2020-10-11: qty 10

## 2020-10-11 NOTE — Progress Notes (Signed)
Pt complaining of increase in rash size and locations with more discomfort described as burning. Also states she has some burning discomfort while voiding, this started on Saturday. MD notified of above. Port accessed without difficulty no blood return obtained. Flushes easily. Performed fluid challenge. No swelling redness or discomfort. Fluid flows freely off of pump. Received 1 liter of NS. Obtained a U/A and culture prior to discharge. Dr Janese Banks visualized rash today. Prednisone taper sent to pt's pharmacy. Discharged to home.

## 2020-10-12 LAB — URINE CULTURE

## 2020-10-13 ENCOUNTER — Encounter: Payer: Self-pay | Admitting: *Deleted

## 2020-10-13 NOTE — Progress Notes (Signed)
Patient's husband called wanting some stats on survival and stopping chemotherapy.  States him and his wife will be discussing her stopping her treatment tonight and he wants some "ammunition".   Asked if they had discussed with Dr. Janese Banks and he said the patient talked to her about stopping on Monday.  Requested stats from Dr. Janese Banks.  She would like to do a video visit with them tomorrow.  Informed Antony Haste.  They will both be available tomorrow morning at 8:00 for a video visit.  Appointment scheduled.

## 2020-10-14 ENCOUNTER — Encounter: Payer: Self-pay | Admitting: Oncology

## 2020-10-14 ENCOUNTER — Inpatient Hospital Stay (HOSPITAL_BASED_OUTPATIENT_CLINIC_OR_DEPARTMENT_OTHER): Payer: BC Managed Care – PPO | Admitting: Oncology

## 2020-10-14 DIAGNOSIS — Z170 Estrogen receptor positive status [ER+]: Secondary | ICD-10-CM | POA: Diagnosis not present

## 2020-10-14 DIAGNOSIS — Z7189 Other specified counseling: Secondary | ICD-10-CM | POA: Diagnosis not present

## 2020-10-14 DIAGNOSIS — C50412 Malignant neoplasm of upper-outer quadrant of left female breast: Secondary | ICD-10-CM

## 2020-10-14 NOTE — Progress Notes (Signed)
I connected with Wendy Caldwell on 10/14/20 at  8:00 AM EDT by video enabled telemedicine visit and verified that I am speaking with the correct person using two identifiers.   I discussed the limitations, risks, security and privacy concerns of performing an evaluation and management service by telemedicine and the availability of in-person appointments. I also discussed with the patient that there may be a patient responsible charge related to this service. The patient expressed understanding and agreed to proceed.  Other persons participating in the visit and their role in the encounter:  patients husband  Patient's location:  home Provider's location:  home  Chief Complaint:  discuss continuing treatment due to concerns of toxicity  History of present illness: Patient is a 65 year old female with a past medical history significant for psoriasis for which she is on Humira.  She self palpated a left breast mass which prompted a bilateral diagnostic mammogram on 06/25/2020.  That showed an irregular hypoechoic mass 2.5 x 1.7 x 1.8 cm at 1 o'clock position 1 cm from the nipple.  3 cm from the nipple were benign cysts.  1 mildly enlarged left axillary lymph node with cortical thickening 4 mm.  Both the breast mass and the lymph node were biopsied.  Left breast biopsy was consistent with invasive mammary carcinoma grade 3 ER weakly +1 to 10%, PR negative and HER2 positive by IHC.   Patient has completed 4 cycles of neoadjuvant TCHP chemotherapy so far.  She has been feeling more fatigued with chemotherapy and also developed significant skin rash involving her bilateral hands, left axilla as well as back causing significant discomfort.    Patient has baseline microcytosis with a hemoglobin of around 12.  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    Interval history patient reports significant improvement in  her symptoms after starting high-dose steroids for her skin rash.  Rash in her bilateral hands has improved a whole lot but is still there.  Overall she is feeling better   Review of Systems  Constitutional:  Positive for malaise/fatigue. Negative for chills, fever and weight loss.  HENT:  Negative for congestion, ear discharge and nosebleeds.   Eyes:  Negative for blurred vision.  Respiratory:  Negative for cough, hemoptysis, sputum production, shortness of breath and wheezing.   Cardiovascular:  Negative for chest pain, palpitations, orthopnea and claudication.  Gastrointestinal:  Negative for abdominal pain, blood in stool, constipation, diarrhea, heartburn, melena, nausea and vomiting.  Genitourinary:  Negative for dysuria, flank pain, frequency, hematuria and urgency.  Musculoskeletal:  Negative for back pain, joint pain and myalgias.  Skin:  Positive for rash.  Neurological:  Negative for dizziness, tingling, focal weakness, seizures, weakness and headaches.  Endo/Heme/Allergies:  Does not bruise/bleed easily.  Psychiatric/Behavioral:  Negative for depression and suicidal ideas. The patient does not have insomnia.    No Known Allergies  Past Medical History:  Diagnosis Date   Family history of adverse reaction to anesthesia    adopted   GERD (gastroesophageal reflux disease)     Past Surgical History:  Procedure Laterality Date   BREAST BIOPSY Left 07/05/2020   [place] Bx, Q clip, path pending   BREAST BIOPSY Left 07/05/2020   [place] Bx, Vision, path pending    CARPAL TUNNEL RELEASE Bilateral    CHOLECYSTECTOMY     GANGLION CYST EXCISION     wrist   PORTACATH PLACEMENT Right 07/21/2020   Procedure:  INSERTION PORT-A-CATH;  Surgeon: Herbert Pun, MD;  Location: [facility] ORS;  Service: General;  Laterality: Right;   right ovary removal      Social History   Socioeconomic History   Marital status: Married    Spouse name: Not on file   Number of children: Not on file    Years of education: Not on file   Highest education level: Not on file  Occupational History   Not on file  Tobacco Use   Smoking status: Former    Types: Cigarettes    Start date: 2012   Smokeless tobacco: Never  Vaping Use   Vaping Use: Never used  Substance and Sexual Activity   Alcohol use: Yes    Comment: occassional    Drug use: Never   Sexual activity: Not on file  Other Topics Concern   Not on file  Social History Narrative   Not on file   Social Determinants of Health   Financial Resource Strain: Not on file  Food Insecurity: Not on file  Transportation Needs: Not on file  Physical Activity: Not on file  Stress: Not on file  Social Connections: Not on file  Intimate Partner Violence: Not on file    Family History  Adopted: Yes  Problem Relation Age of Onset   Breast cancer Neg Hx      Current Outpatient Medications:    dexamethasone (DECADRON) 4 MG tablet, Take 2 tablets (8 mg total) by mouth 2 (two) times daily. Start the day before Taxotere. Then take daily x 3 days after chemotherapy., Disp: 30 tablet, Rfl: 2   lidocaine-prilocaine (EMLA) cream, Apply 1 application topically daily as needed (prior to port access)., Disp: 30 g, Rfl: 2   loratadine (CLARITIN) 10 MG tablet, Take 10 mg by mouth daily., Disp: , Rfl:    loratadine-pseudoephedrine (CLARITIN-D 24 HOUR) 10-240 MG 24 hr tablet, Take 1 tablet by mouth daily., Disp: 30 tablet, Rfl: 0   LORazepam (ATIVAN) 0.5 MG tablet, Take 1 tablet (0.5 mg total) by mouth every 6 (six) hours as needed (Nausea or vomiting)., Disp: 30 tablet, Rfl: 0   Multiple Vitamins-Minerals (VITEYES AREDS FORMULA/LUTEIN) CAPS, Take by mouth., Disp: , Rfl:    omeprazole (PRILOSEC) 40 MG capsule, Take 1 capsule (40 mg total) by mouth daily., Disp: 30 capsule, Rfl: 0   ondansetron (ZOFRAN) 8 MG tablet, Take 1 tablet (8 mg total) by mouth 2 (two) times daily as needed (Nausea or vomiting). Start on the third day after chemotherapy.  (Patient not taking: Reported on 09/30/2020), Disp: 30 tablet, Rfl: 1   Polyethylene Glycol 400 (BLINK TEARS) 0.25 % SOLN, Apply 1 drop to eye 5 (five) times daily as needed. Every 2 hrs daily, Disp: , Rfl:    predniSONE (DELTASONE) 10 MG tablet, Take 1 tablet (10 mg total) by mouth daily with breakfast. Start with 5 tablets daily x 3 days , then 4 tablets x 3 day, then 3 tablets x 3 days, then 2 tablets x 3 days and then 1 tablet x 3 days, Disp: 45 tablet, Rfl: 0   prochlorperazine (COMPAZINE) 10 MG tablet, Take 1 tablet (10 mg total) by mouth every 6 (six) hours as needed (Nausea or vomiting). (Patient not taking: Reported on 09/30/2020), Disp: 30 tablet, Rfl: 1   sulfamethoxazole-trimethoprim (BACTRIM DS) 800-160 MG tablet, Take 1 tablet by mouth 2 (two) times daily., Disp: 10 tablet, Rfl: 0   triamcinolone ointment (KENALOG) 0.5 %, Apply 1 application topically 2 (two) times daily.,  Disp: 30 g, Rfl: 0   vitamin B-12 (CYANOCOBALAMIN) 1000 MCG tablet, Take 1,000 mcg by mouth daily., Disp: , Rfl:    vitamin C (ASCORBIC ACID) 500 MG tablet, Take 500 mg by mouth daily., Disp: , Rfl:    vitamin E 180 MG (400 UNITS) capsule, Take 400 Units by mouth daily., Disp: , Rfl:   US Breast Limited Uni Left Inc Axilla  Result Date: 09/28/2020 CLINICAL DATA:  65 year old female diagnosed with invasive mammary carcinoma in the retroareolar left breast presenting for interim evaluation as the patient is undergoing neoadjuvant chemotherapy. She had MRI guided biopsies of the bilateral breasts 08/12/2020 with pathology results demonstrating benign breast tissue with increased stromal fibrosis and patchy periductal chronic inflammation of the upper-outer quadrant of the left breast, and benign breast tissue with increased stromal fibrosis of the retroareolar right breast. EXAM: ULTRASOUND OF THE LEFT BREAST COMPARISON:  Previous exam(s). FINDINGS: Ultrasound targeted to the retroareolar left breast at 1 o'clock, 1 cm from  the nipple demonstrates the defined irregular mass has mildly decreased in size measuring 2.5 x 1.3 x 1.2 cm. No mass previously measured 2.5 x 1.7 x 1.8 cm on ultrasound 06/25/2020. In the left breast at 1 o'clock, 2 cm from the nipple there is an oval hypoechoic mass measuring 0.9 x 0.6 x 0.8 cm. This corresponds well in location, size and shape with the mass identified and biopsied under MRI guidance, which was benign. Near this mass is a 0.6 cm benign cyst, which can also be seen on the MRI. IMPRESSION: Mild decrease in size of the known left breast cancer at 1 o'clock. RECOMMENDATION: Continue treatment plan for known left breast cancer. I have discussed the findings and recommendations with the patient. If applicable, a reminder letter will be sent to the patient regarding the next appointment. BI-RADS CATEGORY  6: Known biopsy-proven malignancy. Electronically Signed   By: Ammie Ferrier M.D.   On: 09/28/2020 13:47   No images are attached to the encounter.   CMP Latest Ref Rng & Units 09/30/2020  Glucose 70 - 99 mg/dL 140(H)  BUN 8 - 23 mg/dL 13  Creatinine 0.44 - 1.00 mg/dL 0.67  Sodium 135 - 145 mmol/L 139  Potassium 3.5 - 5.1 mmol/L 3.7  Chloride 98 - 111 mmol/L 104  CO2 22 - 32 mmol/L 24  Calcium 8.9 - 10.3 mg/dL 9.0  Total Protein 6.5 - 8.1 g/dL 6.8  Total Bilirubin 0.3 - 1.2 mg/dL 0.8  Alkaline Phos 38 - 126 U/L 97  AST 15 - 41 U/L 36  ALT 0 - 44 U/L 35   CBC Latest Ref Rng & Units 09/30/2020  WBC 4.0 - 10.5 K/uL 9.5  Hemoglobin 12.0 - 15.0 g/dL 10.0(L)  Hematocrit 36.0 - 46.0 % 31.4(L)  Platelets 150 - 400 K/uL 271     Observation/objective: Appears in no acute distress over video visit today.  Breathing is nonlabored  Assessment and plan: Patient is a 65 year old female with clinical prognostic stage IIa T2 N0 M0 grade 3 invasive mammary carcinoma ER weakly positive, PR negative and HER2 positive s/p 4 cycles of neoadjuvant TCHP chemotherapy here to discuss ongoing  toxicity and continuation of chemotherapy  Patient had an interim ultrasound after 3 cycles of chemotherapy which showed some reduction in the size of her breast mass which was not significant.  From 2.5 x 1.7 x 1.8 cm the mass had gone down to 2.5 x 1.3 x 1.2 cm.  She is developing progressive fatigue  as well as significant skin rash with chemotherapy.  We discussed trying to finish 2 more cycles of TCHP chemotherapy versus stopping chemotherapy at this time and proceeding with surgery.  If we finish 6 cycles of TCHP chemotherapy that would give [place] an idea if patient would have achieved a pathological complete response or not which would be the basis to change treatment post surgery.    After deciding the pros and cons of continuing chemotherapy we have mutually agreed upon stopping chemotherapy at this time and proceeding with definitive lumpectomy and sentinel lymph node biopsy.  Patient does not have locally advanced disease and had negative nodes.  Depending on the response seen on her final pathology specimen I will decide if we can continue with Herceptin based treatment adjuvantly or switch to Kadcyla.  Patient will also require postlumpectomy radiation treatment which can go hand-in-hand with Herceptin or Kadcyla.  Patient is currently on a steroid taper which she will complete over the next 10 to 12 days.  I have also reached out to Dr. Peyton Najjar to schedule her for surgery soon.  I will tentatively see her on 10/29/2020 with repeat labs.  Patient and her husband had multiple insightful questions which were all answered to their satisfaction.  Follow-up instructions:  I discussed the assessment and treatment plan with the patient. The patient was provided an opportunity to ask questions and all were answered. The patient agreed with the plan and demonstrated an understanding of the instructions.   The patient was advised to call back or seek an in-person evaluation if the symptoms worsen or if  the condition fails to improve as anticipated.   Visit Diagnosis: 1. Malignant neoplasm of upper-outer quadrant of left breast in female, estrogen receptor positive ([place])   2. Goals of care, counseling/discussion     Dr. Randa Evens, MD, MPH [facility] Tel- [phone] 10/14/2020 11:23 AM

## 2020-10-15 ENCOUNTER — Ambulatory Visit: Payer: Self-pay | Admitting: General Surgery

## 2020-10-15 DIAGNOSIS — C50412 Malignant neoplasm of upper-outer quadrant of left female breast: Secondary | ICD-10-CM | POA: Diagnosis not present

## 2020-10-15 NOTE — H&P (View-Only) (Signed)
HISTORY OF PRESENT ILLNESS:    Wendy Caldwell is a 65 y.o.female patient who comes for surgical evaluation for left breast cancer.  Wendy Caldwell reports she felt a palpable area under her left nipple.  She went to see her primary care.  Mammogram and ultrasound, diagnostic was ordered.  This showed a 2.3 cm mass at 1:00 under the left nipple areolar complex.  This led to core needle biopsy.  Core needle biopsy showed invasive mammary carcinoma.  A suspicious lymph node was also biopsied but was negative for malignancy.  Patient denies any breast pain.  There is no pain radiation.  There is no alleviating or aggravating factors.  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  Due to the size of the mass and HER2 positive she underwent neoadjuvant chemotherapy.  Due to side effect and not significantly decrease of the size of the mass it was decided to stop neoadjuvant chemotherapy and proceed with surgical management.      PAST MEDICAL HISTORY:  Past Medical History:  Diagnosis Date   GERD (gastroesophageal reflux disease)    Malignant neoplasm of left breast (CMS-HCC) 2021        PAST SURGICAL HISTORY:   Past Surgical History:  Procedure Laterality Date   CHOLECYSTECTOMY           MEDICATIONS:  Outpatient Encounter Medications as of 10/15/2020  Medication Sig Dispense Refill   ascorbic acid, vitamin C, (VITAMIN C) 500 MG tablet Take by mouth once daily     CLARITIN-D 24 HOUR 10-240 mg ER tablet Take 1 tablet by mouth once daily as needed     cyanocobalamin (VITAMIN B12) 1000 MCG tablet Take 1,000 mcg by mouth once daily     lidocaine-prilocaine (EMLA) cream Apply topically as needed     LORazepam (ATIVAN) 0.5 MG tablet Take by mouth as needed     MULTIVITAMIN ORAL Take 1 tablet by mouth once daily     omeprazole (PRILOSEC) 40 MG DR capsule Take 1 capsule (40 mg total) by mouth once daily 90 capsule 3   polyethylene glycol 400 (BLINK TEARS) 0.25 % Drop Apply  to eye as needed     predniSONE (DELTASONE) 10 MG tablet Take by mouth     vit C/E/Zn/coppr/lutein/zeaxan (PRESERVISION AREDS-2 ORAL) Take 1 capsule by mouth 2 (two) times daily     vitamin E 400 UNIT capsule Take by mouth once daily     HUMIRA,CF, PEN 40 mg/0.4 mL pen injector kit Inject 40 mg subcutaneously every 14 (fourteen) days (Patient not taking: Reported on 10/15/2020)     loratadine-pseudoephedrine (CLARITIN-D 24-HOUR) 10-240 mg ER tablet Take 1 tablet by mouth once daily as needed for Allergies (Patient not taking: Reported on 10/15/2020) 30 tablet 5   multivitamin (THERA TAB) tablet Take by mouth once daily (Patient not taking: Reported on 10/15/2020)     ondansetron (ZOFRAN-ODT) 4 MG disintegrating tablet Take 1 tablet (4 mg total) by mouth every 8 (eight) hours as needed for Nausea or Vomiting (Patient not taking: No sig reported) 15 tablet 1   sodium, potassium, and magnesium (SUPREP) oral solution Take 1 Bottle by mouth as directed One kit contains 2 bottles.  Take both bottles at the times instructed by your provider. (Patient not taking: Reported on 10/15/2020) 354 mL 0   No facility-administered encounter medications on file as of 10/15/2020.     ALLERGIES:   Patient has no known allergies.   SOCIAL HISTORY:  Social History     Socioeconomic History   Marital status: Married  Tobacco Use   Smoking status: Former Smoker    Quit date: 2012    Years since quitting: 10.6   Smokeless tobacco: Never Used  Vaping Use   Vaping Use: Never used  Substance and Sexual Activity   Alcohol use: Yes    Alcohol/week: 2.0 standard drinks    Types: 1 Glasses of wine, 1 Shots of liquor per week   Drug use: Never    FAMILY HISTORY:  Family History  Adopted: Yes     GENERAL REVIEW OF SYSTEMS:   General ROS: negative for - chills, fatigue, fever, weight gain or weight loss Allergy and Immunology ROS: negative for - hives  Hematological and Lymphatic ROS: negative for - bleeding problems  or bruising, negative for palpable nodes Endocrine ROS: negative for - heat or cold intolerance, hair changes Respiratory ROS: negative for - cough, shortness of breath or wheezing Cardiovascular ROS: no chest pain or palpitations GI ROS: negative for nausea, vomiting, abdominal pain, diarrhea, constipation Musculoskeletal ROS: negative for - joint swelling or muscle pain Neurological ROS: negative for - confusion, syncope Dermatological ROS: negative for pruritus and rash  PHYSICAL EXAM:  Vitals:   10/15/20 0919  BP: 125/78  Pulse: 87  .  Ht:157.5 cm (5' 2") Wt:73.9 kg (163 lb) BSA:Body surface area is 1.8 meters squared. Body mass index is 29.81 kg/m..   GENERAL: Alert, active, oriented x3  HEENT: Pupils equal reactive to light. Extraocular movements are intact. Sclera clear. Palpebral conjunctiva normal red color.Pharynx clear.  NECK: Supple with no palpable mass and no adenopathy.  LUNGS: Sound clear with no rales rhonchi or wheezes.  HEART: Regular rhythm S1 and S2 without murmur.  BREAST: There is a small induration area of the left breast at 3:00 of the areolar complex.  There is no nipple retraction.  There is no axillary adenopathy.  No ulceration of the skin.  Right breast without any palpable mass or skin changes.  No axillary adenopathy.  ABDOMEN: Soft and depressible, nontender with no palpable mass, no hepatomegaly.   EXTREMITIES: Well-developed well-nourished symmetrical with no dependent edema.  NEUROLOGICAL: Awake alert oriented, facial expression symmetrical, moving all extremities.      IMPRESSION:     Malignant neoplasm of upper-outer quadrant of left female breast, unspecified estrogen receptor status (CMS-HCC) [C50.412]       Patient with invasive ductal carcinoma ER positive, HER2 positive.  She underwent 3 cycles of neoadjuvant chemotherapy with significant side effects.  Decision was to hold neoadjuvant chemotherapy and proceed with surgical  management.  I discussed with the patient the alternative of partial versus total mastectomy.  Patient decided to proceed with partial mastectomy and sentinel node biopsy.  I discussed with the patient surgical technique and the risks of surgery include pain, bleeding, infection, cosmetic results, lymphadenopathy, among others.  The patient reports understood and agreed to proceed.   PLAN:  Radiofrequency tagged left breast partial mastectomy with sentinel lymph node biopsy (19301, [id number]) CBC, CMP (done) Avoid taking aspirin 5 days before surgery Contact us if you have any concern.   Patient verbalized understanding, all questions were answered, and were agreeable with the plan outlined above.   Saivion Goettel Cintron-Diaz, MD  Electronically signed by Deloyd Handy Cintron-Diaz, MD  

## 2020-10-15 NOTE — H&P (Signed)
HISTORY OF PRESENT ILLNESS:    [name] is a 65 y.o.female patient who comes for surgical evaluation for left breast cancer.  [name] reports she felt a palpable area under her left nipple.  She went to see her primary care.  Mammogram and ultrasound, diagnostic was ordered.  This showed a 2.3 cm mass at 1:00 under the left nipple areolar complex.  This led to core needle biopsy.  Core needle biopsy showed invasive mammary carcinoma.  A suspicious lymph node was also biopsied but was negative for malignancy.  Patient denies any breast pain.  There is no pain radiation.  There is no alleviating or aggravating factors.  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  Due to the size of the mass and HER2 positive she underwent neoadjuvant chemotherapy.  Due to side effect and not significantly decrease of the size of the mass it was decided to stop neoadjuvant chemotherapy and proceed with surgical management.      PAST MEDICAL HISTORY:  Past Medical History:  Diagnosis Date   GERD (gastroesophageal reflux disease)    Malignant neoplasm of left breast (CMS-HCC) 2021        PAST SURGICAL HISTORY:   Past Surgical History:  Procedure Laterality Date   CHOLECYSTECTOMY           MEDICATIONS:  Outpatient Encounter Medications as of 10/15/2020  Medication Sig Dispense Refill   ascorbic acid, vitamin C, (VITAMIN C) 500 MG tablet Take by mouth once daily     CLARITIN-D 24 HOUR 10-240 mg ER tablet Take 1 tablet by mouth once daily as needed     cyanocobalamin (VITAMIN B12) 1000 MCG tablet Take 1,000 mcg by mouth once daily     lidocaine-prilocaine (EMLA) cream Apply topically as needed     LORazepam (ATIVAN) 0.5 MG tablet Take by mouth as needed     MULTIVITAMIN ORAL Take 1 tablet by mouth once daily     omeprazole (PRILOSEC) 40 MG DR capsule Take 1 capsule (40 mg total) by mouth once daily 90 capsule 3   polyethylene glycol 400 (BLINK TEARS) 0.25 % Drop Apply  to eye as needed     predniSONE (DELTASONE) 10 MG tablet Take by mouth     vit C/E/Zn/coppr/lutein/zeaxan (PRESERVISION AREDS-2 ORAL) Take 1 capsule by mouth 2 (two) times daily     vitamin E 400 UNIT capsule Take by mouth once daily     HUMIRA,CF, PEN 40 mg/0.4 mL pen injector kit Inject 40 mg subcutaneously every 14 (fourteen) days (Patient not taking: Reported on 10/15/2020)     loratadine-pseudoephedrine (CLARITIN-D 24-HOUR) 10-240 mg ER tablet Take 1 tablet by mouth once daily as needed for Allergies (Patient not taking: Reported on 10/15/2020) 30 tablet 5   multivitamin (THERA TAB) tablet Take by mouth once daily (Patient not taking: Reported on 10/15/2020)     ondansetron (ZOFRAN-ODT) 4 MG disintegrating tablet Take 1 tablet (4 mg total) by mouth every 8 (eight) hours as needed for Nausea or Vomiting (Patient not taking: No sig reported) 15 tablet 1   sodium, potassium, and magnesium (SUPREP) oral solution Take 1 Bottle by mouth as directed One kit contains 2 bottles.  Take both bottles at the times instructed by your provider. (Patient not taking: Reported on 10/15/2020) 354 mL 0   No facility-administered encounter medications on file as of 10/15/2020.     ALLERGIES:   Patient has no known allergies.   SOCIAL HISTORY:  Social History  Socioeconomic History   Marital status: Married  Tobacco Use   Smoking status: Former Smoker    Quit date: 2012    Years since quitting: 10.6   Smokeless tobacco: Never Used  Scientific laboratory technician Use: Never used  Substance and Sexual Activity   Alcohol use: Yes    Alcohol/week: 2.0 standard drinks    Types: 1 Glasses of wine, 1 Shots of liquor per week   Drug use: Never    FAMILY HISTORY:  Family History  Adopted: Yes     GENERAL REVIEW OF SYSTEMS:   General ROS: negative for - chills, fatigue, fever, weight gain or weight loss Allergy and Immunology ROS: negative for - hives  Hematological and Lymphatic ROS: negative for - bleeding problems  or bruising, negative for palpable nodes Endocrine ROS: negative for - heat or cold intolerance, hair changes Respiratory ROS: negative for - cough, shortness of breath or wheezing Cardiovascular ROS: no chest pain or palpitations GI ROS: negative for nausea, vomiting, abdominal pain, diarrhea, constipation Musculoskeletal ROS: negative for - joint swelling or muscle pain Neurological ROS: negative for - confusion, syncope Dermatological ROS: negative for pruritus and rash  PHYSICAL EXAM:  Vitals:   10/15/20 0919  BP: 125/78  Pulse: 87  .  Ht:157.5 cm (_0 ) Wt:73.9 kg (163 lb) JSC:BIPJ surface area is 1.8 meters squared. Body mass index is 29.81 kg/m.Marland Kitchen   GENERAL: Alert, active, oriented x3  HEENT: Pupils equal reactive to light. Extraocular movements are intact. Sclera clear. Palpebral conjunctiva normal red color.Pharynx clear.  NECK: Supple with no palpable mass and no adenopathy.  LUNGS: Sound clear with no rales rhonchi or wheezes.  HEART: Regular rhythm S1 and S2 without murmur.  BREAST: There is a small induration area of the left breast at 3:00 of the areolar complex.  There is no nipple retraction.  There is no axillary adenopathy.  No ulceration of the skin.  Right breast without any palpable mass or skin changes.  No axillary adenopathy.  ABDOMEN: Soft and depressible, nontender with no palpable mass, no hepatomegaly.   EXTREMITIES: Well-developed well-nourished symmetrical with no dependent edema.  NEUROLOGICAL: Awake alert oriented, facial expression symmetrical, moving all extremities.      IMPRESSION:     Malignant neoplasm of upper-outer quadrant of left female breast, unspecified estrogen receptor status (CMS-HCC) [C50.412]       Patient with invasive ductal carcinoma ER positive, HER2 positive.  She underwent 3 cycles of neoadjuvant chemotherapy with significant side effects.  Decision was to hold neoadjuvant chemotherapy and proceed with surgical  management.  I discussed with the patient the alternative of partial versus total mastectomy.  Patient decided to proceed with partial mastectomy and sentinel node biopsy.  I discussed with the patient surgical technique and the risks of surgery include pain, bleeding, infection, cosmetic results, lymphadenopathy, among others.  The patient reports understood and agreed to proceed.   PLAN:  Radiofrequency tagged left breast partial mastectomy with sentinel lymph node biopsy (19301, [id number]) CBC, CMP (done) Avoid taking aspirin 5 days before surgery Contact us if you have any concern.   Patient verbalized understanding, all questions were answered, and were agreeable with the plan outlined above.   Herbert Pun, MD  Electronically signed by Herbert Pun, MD

## 2020-10-18 ENCOUNTER — Ambulatory Visit
Admission: RE | Admit: 2020-10-18 | Discharge: 2020-10-18 | Disposition: A | Discharge status: Home / Self Care | Payer: BC Managed Care – PPO | Source: Ambulatory Visit | Attending: Oncology | Admitting: Oncology

## 2020-10-18 ENCOUNTER — Other Ambulatory Visit: Payer: Self-pay

## 2020-10-18 ENCOUNTER — Other Ambulatory Visit: Payer: Self-pay | Admitting: General Surgery

## 2020-10-18 DIAGNOSIS — Z170 Estrogen receptor positive status [ER+]: Secondary | ICD-10-CM | POA: Diagnosis not present

## 2020-10-18 DIAGNOSIS — Z5181 Encounter for therapeutic drug level monitoring: Secondary | ICD-10-CM | POA: Diagnosis not present

## 2020-10-18 DIAGNOSIS — I517 Cardiomegaly: Secondary | ICD-10-CM | POA: Diagnosis not present

## 2020-10-18 DIAGNOSIS — C50412 Malignant neoplasm of upper-outer quadrant of left female breast: Secondary | ICD-10-CM

## 2020-10-18 DIAGNOSIS — Z0189 Encounter for other specified special examinations: Secondary | ICD-10-CM

## 2020-10-18 DIAGNOSIS — K219 Gastro-esophageal reflux disease without esophagitis: Secondary | ICD-10-CM | POA: Diagnosis not present

## 2020-10-18 LAB — ECHOCARDIOGRAM COMPLETE
AR max vel: 2.3 cm2
AV Area VTI: 2.46 cm2
AV Area mean vel: 2.23 cm2
AV Mean grad: 5 mmHg
AV Peak grad: 8.4 mmHg
Ao pk vel: 1.45 m/s
Area-P 1/2: 3.61 cm2
S' Lateral: 1.78 cm

## 2020-10-18 NOTE — Progress Notes (Signed)
*  PRELIMINARY RESULTS* Echocardiogram 2D Echocardiogram has been performed.  Wendy Caldwell 10/18/2020, 10:33 AM

## 2020-10-20 ENCOUNTER — Ambulatory Visit
Admission: RE | Admit: 2020-10-20 | Discharge: 2020-10-20 | Disposition: A | Discharge status: Home / Self Care | Payer: BC Managed Care – PPO | Source: Ambulatory Visit | Attending: General Surgery | Admitting: General Surgery

## 2020-10-20 ENCOUNTER — Other Ambulatory Visit: Payer: Self-pay | Admitting: General Surgery

## 2020-10-20 ENCOUNTER — Other Ambulatory Visit: Payer: Self-pay

## 2020-10-20 ENCOUNTER — Encounter
Admission: RE | Admit: 2020-10-20 | Discharge: 2020-10-20 | Disposition: A | Discharge status: Home / Self Care | Payer: BC Managed Care – PPO | Source: Ambulatory Visit | Attending: General Surgery | Admitting: General Surgery

## 2020-10-20 ENCOUNTER — Ambulatory Visit: Admission: RE | Admit: 2020-10-20 | Payer: BC Managed Care – PPO | Source: Ambulatory Visit

## 2020-10-20 DIAGNOSIS — C50412 Malignant neoplasm of upper-outer quadrant of left female breast: Secondary | ICD-10-CM

## 2020-10-20 DIAGNOSIS — R928 Other abnormal and inconclusive findings on diagnostic imaging of breast: Secondary | ICD-10-CM | POA: Diagnosis not present

## 2020-10-20 DIAGNOSIS — N6342 Unspecified lump in left breast, subareolar: Secondary | ICD-10-CM | POA: Diagnosis not present

## 2020-10-20 NOTE — Patient Instructions (Signed)
Your procedure is scheduled on: 10/26/20 Report to  [facility] at time given by Dr Windell Moment staff [phone]) Day Surgery [phone]   Remember: Instructions that are not followed completely may result in serious medical risk, up to and including death, or upon the discretion of your surgeon and anesthesiologist your surgery may need to be rescheduled.     _X__ 1. Do not eat food or drink any liquids after midnight the night before your procedure.                 No gum chewing or hard candies.   __X__2.  On the morning of surgery brush your teeth with toothpaste and water, you                 may rinse your mouth with mouthwash if you wish.  Do not swallow any              toothpaste of mouthwash.     _X__ 3.  No Alcohol for 24 hours before or after surgery.   _X__ 4.  Do Not Smoke or use e-cigarettes For 24 Hours Prior to Your Surgery.                 Do not use any chewable tobacco products for at least 6 hours prior to                 surgery.  ____  5.  Bring all medications with you on the day of surgery if instructed.   __X__  6.  Notify your doctor if there is any change in your medical condition      (cold, fever, infections).     Do not wear jewelry, make-up, hairpins, clips or nail polish. Do not wear lotions, powders, or perfumes. NO DEODORANT Do not shave BODY HAIR 48 hours prior to surgery. Men may shave face and neck. Do not bring valuables to the hospital.    [facility] is not responsible for any belongings or valuables.  Contacts, dentures/partials or body piercings may not be worn into surgery. Bring a case for your contacts, glasses or hearing aids, a denture cup will be supplied. Leave your suitcase in the car. After surgery it may be brought to your room. For patients admitted to the hospital, discharge time is determined by your treatment team.   Patients discharged the day of surgery will not be allowed to drive home.   Please read over the  following fact sheets that you were given:     __X__ Take these medicines the morning of surgery with A SIP OF WATER:    1. omeprazole (PRILOSEC) 40 MG capsule  2.   3.   4.  5.  6.  ____ Fleet Enema (as directed)   __X__ Use CHG Soap/SAGE wipes as directed  ____ Use inhalers on the day of surgery  ____ Stop metformin/Janumet/Farxiga 2 days prior to surgery    ____ Take 1/2 of usual insulin dose the night before surgery. No insulin the morning          of surgery.   ____ Stop Blood Thinners Coumadin/Plavix/Xarelto/Pleta/Pradaxa/Eliquis/Effient/Aspirin  on   Or contact your Surgeon, Cardiologist or Medical Doctor regarding  ability to stop your blood thinners  __X__ Stop Anti-inflammatories 7 days before surgery such as Advil, Ibuprofen, Motrin,  BC or Goodies Powder, Naprosyn, Naproxen, Aleve, Aspirin    __X__ Stop all herbal supplements, fish oil or vitamin E until after surgery.  STOP ALL SUPPLEMENT TODAY 10/20/20  ____ Bring C-Pap to the hospital.

## 2020-10-25 ENCOUNTER — Ambulatory Visit: Payer: BC Managed Care – PPO

## 2020-10-25 ENCOUNTER — Ambulatory Visit: Payer: BC Managed Care – PPO | Admitting: Oncology

## 2020-10-25 ENCOUNTER — Other Ambulatory Visit: Payer: BC Managed Care – PPO

## 2020-10-25 MED ORDER — CEFAZOLIN SODIUM-DEXTROSE 2-4 GM/100ML-% IV SOLN
2.0000 g | INTRAVENOUS | Status: AC
  Administered 2020-10-26: 2 g via INTRAVENOUS

## 2020-10-26 ENCOUNTER — Ambulatory Visit: Payer: BC Managed Care – PPO | Admitting: Certified Registered Nurse Anesthetist

## 2020-10-26 ENCOUNTER — Encounter: Payer: Self-pay | Admitting: General Surgery

## 2020-10-26 ENCOUNTER — Ambulatory Visit
Admission: RE | Admit: 2020-10-26 | Discharge: 2020-10-26 | Disposition: A | Discharge status: Home / Self Care | Payer: BC Managed Care – PPO | Attending: General Surgery | Admitting: General Surgery

## 2020-10-26 ENCOUNTER — Other Ambulatory Visit: Payer: BC Managed Care – PPO

## 2020-10-26 ENCOUNTER — Ambulatory Visit
Admission: RE | Admit: 2020-10-26 | Discharge: 2020-10-26 | Disposition: A | Discharge status: Home / Self Care | Payer: BC Managed Care – PPO | Source: Ambulatory Visit | Attending: General Surgery | Admitting: General Surgery

## 2020-10-26 ENCOUNTER — Other Ambulatory Visit: Payer: Self-pay

## 2020-10-26 ENCOUNTER — Encounter
Admission: RE | Disposition: A | Discharge status: Home / Self Care | Payer: Self-pay | Source: Home / Self Care | Attending: General Surgery

## 2020-10-26 DIAGNOSIS — Z9221 Personal history of antineoplastic chemotherapy: Secondary | ICD-10-CM | POA: Diagnosis not present

## 2020-10-26 DIAGNOSIS — D242 Benign neoplasm of left breast: Secondary | ICD-10-CM | POA: Diagnosis not present

## 2020-10-26 DIAGNOSIS — Z7952 Long term (current) use of systemic steroids: Secondary | ICD-10-CM | POA: Insufficient documentation

## 2020-10-26 DIAGNOSIS — Z170 Estrogen receptor positive status [ER+]: Secondary | ICD-10-CM | POA: Insufficient documentation

## 2020-10-26 DIAGNOSIS — K219 Gastro-esophageal reflux disease without esophagitis: Secondary | ICD-10-CM | POA: Diagnosis not present

## 2020-10-26 DIAGNOSIS — R928 Other abnormal and inconclusive findings on diagnostic imaging of breast: Secondary | ICD-10-CM | POA: Diagnosis not present

## 2020-10-26 DIAGNOSIS — Z87891 Personal history of nicotine dependence: Secondary | ICD-10-CM | POA: Insufficient documentation

## 2020-10-26 DIAGNOSIS — C50412 Malignant neoplasm of upper-outer quadrant of left female breast: Secondary | ICD-10-CM

## 2020-10-26 DIAGNOSIS — Z79899 Other long term (current) drug therapy: Secondary | ICD-10-CM | POA: Insufficient documentation

## 2020-10-26 DIAGNOSIS — C50812 Malignant neoplasm of overlapping sites of left female breast: Secondary | ICD-10-CM | POA: Diagnosis not present

## 2020-10-26 HISTORY — PX: BREAST LUMPECTOMY: SHX2

## 2020-10-26 HISTORY — PX: PART MASTECTOMY,RADIO FREQUENCY LOCALIZER,AXILLARY SENTINEL NODE BIOPSY: SHX6901

## 2020-10-26 SURGERY — PART MASTECTOMY,RADIO FREQUENCY LOCALIZER,AXILLARY SENTINEL NODE BIOPSY
Anesthesia: General | Site: Breast | Laterality: Left

## 2020-10-26 MED ORDER — FENTANYL CITRATE (PF) 100 MCG/2ML IJ SOLN
INTRAMUSCULAR | Status: AC
  Administered 2020-10-26: 25 ug via INTRAVENOUS
  Filled 2020-10-26: qty 2

## 2020-10-26 MED ORDER — MIDAZOLAM HCL 2 MG/2ML IJ SOLN
INTRAMUSCULAR | Status: AC
  Filled 2020-10-26: qty 2

## 2020-10-26 MED ORDER — CHLORHEXIDINE GLUCONATE 0.12 % MT SOLN: 15.0000 mL | Freq: Once | OROMUCOSAL | Status: AC

## 2020-10-26 MED ORDER — STERILE WATER FOR IRRIGATION IR SOLN
Status: DC | PRN
  Administered 2020-10-26: 1000 mL

## 2020-10-26 MED ORDER — LIDOCAINE HCL (CARDIAC) PF 100 MG/5ML IV SOSY
PREFILLED_SYRINGE | INTRAVENOUS | Status: DC | PRN
  Administered 2020-10-26: 50 mg via INTRAVENOUS

## 2020-10-26 MED ORDER — LACTATED RINGERS IV SOLN: INTRAVENOUS | Status: DC

## 2020-10-26 MED ORDER — HYDROCODONE-ACETAMINOPHEN 5-325 MG PO TABS: 1.0000 | ORAL_TABLET | ORAL | 0 refills | Status: AC | PRN

## 2020-10-26 MED ORDER — MIDAZOLAM HCL 2 MG/2ML IJ SOLN
INTRAMUSCULAR | Status: DC | PRN
  Administered 2020-10-26: 2 mg via INTRAVENOUS

## 2020-10-26 MED ORDER — BUPIVACAINE-EPINEPHRINE (PF) 0.5% -1:200000 IJ SOLN
INTRAMUSCULAR | Status: DC | PRN
  Administered 2020-10-26: 30 mL

## 2020-10-26 MED ORDER — ACETAMINOPHEN 10 MG/ML IV SOLN
INTRAVENOUS | Status: AC
  Filled 2020-10-26: qty 100

## 2020-10-26 MED ORDER — ORAL CARE MOUTH RINSE: 15.0000 mL | Freq: Once | OROMUCOSAL | Status: AC

## 2020-10-26 MED ORDER — METHYLENE BLUE 0.5 % INJ SOLN
INTRAVENOUS | Status: AC
  Filled 2020-10-26: qty 10

## 2020-10-26 MED ORDER — ONDANSETRON HCL 4 MG/2ML IJ SOLN: 4.0000 mg | Freq: Once | INTRAMUSCULAR | Status: DC | PRN

## 2020-10-26 MED ORDER — PHENYLEPHRINE HCL (PRESSORS) 10 MG/ML IV SOLN
INTRAVENOUS | Status: DC | PRN
  Administered 2020-10-26 (×4): 100 ug via INTRAVENOUS

## 2020-10-26 MED ORDER — TECHNETIUM TC 99M TILMANOCEPT KIT
1.0000 | PACK | Freq: Once | INTRAVENOUS | Status: AC
  Administered 2020-10-26: 1.078 via INTRADERMAL

## 2020-10-26 MED ORDER — CHLORHEXIDINE GLUCONATE 0.12 % MT SOLN
OROMUCOSAL | Status: AC
  Administered 2020-10-26: 15 mL via OROMUCOSAL
  Filled 2020-10-26: qty 15

## 2020-10-26 MED ORDER — BUPIVACAINE-EPINEPHRINE (PF) 0.5% -1:200000 IJ SOLN
INTRAMUSCULAR | Status: AC
  Filled 2020-10-26: qty 30

## 2020-10-26 MED ORDER — CEFAZOLIN SODIUM-DEXTROSE 2-4 GM/100ML-% IV SOLN
INTRAVENOUS | Status: AC
  Filled 2020-10-26: qty 100

## 2020-10-26 MED ORDER — ONDANSETRON HCL 4 MG/2ML IJ SOLN
INTRAMUSCULAR | Status: DC | PRN
Start: 2020-10-26 — End: 2020-10-26
  Administered 2020-10-26: 4 mg via INTRAVENOUS

## 2020-10-26 MED ORDER — FENTANYL CITRATE (PF) 100 MCG/2ML IJ SOLN
25.0000 ug | INTRAMUSCULAR | Status: DC | PRN
  Administered 2020-10-26 (×2): 25 ug via INTRAVENOUS

## 2020-10-26 MED ORDER — LACTATED RINGERS IV SOLN: INTRAVENOUS | Status: DC | PRN

## 2020-10-26 MED ORDER — FENTANYL CITRATE (PF) 100 MCG/2ML IJ SOLN
INTRAMUSCULAR | Status: AC
  Filled 2020-10-26: qty 2

## 2020-10-26 MED ORDER — FENTANYL CITRATE (PF) 100 MCG/2ML IJ SOLN
INTRAMUSCULAR | Status: DC | PRN
  Administered 2020-10-26 (×4): 25 ug via INTRAVENOUS

## 2020-10-26 MED ORDER — DEXAMETHASONE SODIUM PHOSPHATE 10 MG/ML IJ SOLN
INTRAMUSCULAR | Status: DC | PRN
  Administered 2020-10-26: 10 mg via INTRAVENOUS

## 2020-10-26 MED ORDER — ACETAMINOPHEN 10 MG/ML IV SOLN
INTRAVENOUS | Status: DC | PRN
  Administered 2020-10-26: 1000 mg via INTRAVENOUS

## 2020-10-26 MED ORDER — MEPERIDINE HCL 25 MG/ML IJ SOLN: 6.2500 mg | INTRAMUSCULAR | Status: DC | PRN

## 2020-10-26 MED ORDER — KETOROLAC TROMETHAMINE 30 MG/ML IJ SOLN
INTRAMUSCULAR | Status: DC | PRN
  Administered 2020-10-26: 30 mg via INTRAVENOUS

## 2020-10-26 MED ORDER — PROPOFOL 10 MG/ML IV BOLUS
INTRAVENOUS | Status: DC | PRN
  Administered 2020-10-26: 150 mg via INTRAVENOUS

## 2020-10-26 SURGICAL SUPPLY — 44 items
ADH SKN CLS APL DERMABOND .7 (GAUZE/BANDAGES/DRESSINGS) ×1
APL PRP STRL LF DISP 70% ISPRP (MISCELLANEOUS) ×1
BLADE SURG 15 STRL LF DISP TIS (BLADE) ×2 IMPLANT
BLADE SURG 15 STRL SS (BLADE) ×4
CANISTER SUCT 1200ML W/VALVE (MISCELLANEOUS) ×2 IMPLANT
CHLORAPREP W/TINT 26 (MISCELLANEOUS) ×2 IMPLANT
CNTNR SPEC 2.5X3XGRAD LEK (MISCELLANEOUS)
CONT SPEC 4OZ STER OR WHT (MISCELLANEOUS)
CONT SPEC 4OZ STRL OR WHT (MISCELLANEOUS)
CONTAINER SPEC 2.5X3XGRAD LEK (MISCELLANEOUS) IMPLANT
DERMABOND ADVANCED (GAUZE/BANDAGES/DRESSINGS) ×1
DERMABOND ADVANCED .7 DNX12 (GAUZE/BANDAGES/DRESSINGS) ×1 IMPLANT
DEVICE DUBIN SPECIMEN MAMMOGRA (MISCELLANEOUS) ×2 IMPLANT
DRAPE LAPAROTOMY TRNSV 106X77 (MISCELLANEOUS) ×2 IMPLANT
DRSG GAUZE FLUFF 36X18 (GAUZE/BANDAGES/DRESSINGS) IMPLANT
ELECT CAUTERY BLADE 6.4 (BLADE) ×2 IMPLANT
ELECT REM PT RETURN 9FT ADLT (ELECTROSURGICAL) ×2
ELECTRODE REM PT RTRN 9FT ADLT (ELECTROSURGICAL) ×1 IMPLANT
GAUZE 4X4 16PLY ~~LOC~~+RFID DBL (SPONGE) ×2 IMPLANT
GLOVE SURG ENC MOIS LTX SZ6.5 (GLOVE) ×2 IMPLANT
GLOVE SURG UNDER POLY LF SZ6.5 (GLOVE) ×2 IMPLANT
GOWN STRL REUS W/ TWL LRG LVL3 (GOWN DISPOSABLE) ×2 IMPLANT
GOWN STRL REUS W/TWL LRG LVL3 (GOWN DISPOSABLE) ×4
HEMOSTAT ARISTA ABSORB 3G PWDR (HEMOSTASIS) ×2 IMPLANT
KIT MARKER MARGIN INK (KITS) ×2 IMPLANT
KIT TURNOVER KIT A (KITS) ×2 IMPLANT
LABEL OR SOLS (LABEL) ×2 IMPLANT
MANIFOLD NEPTUNE II (INSTRUMENTS) ×2 IMPLANT
MARKER MARGIN CORRECT CLIP (MARKER) ×2 IMPLANT
NEEDLE HYPO 22GX1.5 SAFETY (NEEDLE) ×2 IMPLANT
NEEDLE HYPO 25X1 1.5 SAFETY (NEEDLE) ×2 IMPLANT
PACK BASIN MINOR ARMC (MISCELLANEOUS) ×2 IMPLANT
SET LOCALIZER 20 PROBE US (MISCELLANEOUS) ×2 IMPLANT
SLEVE PROBE SENORX GAMMA FIND (MISCELLANEOUS) ×2 IMPLANT
SUT MNCRL 4-0 (SUTURE) ×4
SUT MNCRL 4-0 27XMFL (SUTURE) ×2
SUT SILK 2 0 SH (SUTURE) ×2 IMPLANT
SUT VIC AB 3-0 SH 27 (SUTURE) ×4
SUT VIC AB 3-0 SH 27X BRD (SUTURE) ×2 IMPLANT
SUTURE MNCRL 4-0 27XMF (SUTURE) ×2 IMPLANT
SYR 10ML LL (SYRINGE) ×4 IMPLANT
SYR BULB IRRIG 60ML STRL (SYRINGE) ×2 IMPLANT
TRAP NEPTUNE SPECIMEN COLLECT (MISCELLANEOUS) ×2 IMPLANT
WATER STERILE IRR 1000ML POUR (IV SOLUTION) ×2 IMPLANT

## 2020-10-26 NOTE — Anesthesia Procedure Notes (Signed)
Procedure Name: LMA Insertion Date/Time: 10/26/2020 9:34 AM Performed by: Willette Alma, CRNA Pre-anesthesia Checklist: Patient identified, Patient being monitored, Timeout performed, Emergency Drugs available and Suction available Patient Re-evaluated:Patient Re-evaluated prior to induction Oxygen Delivery Method: Circle system utilized Preoxygenation: Pre-oxygenation with 100% oxygen Induction Type: IV induction Ventilation: Mask ventilation without difficulty LMA: LMA inserted LMA Size: 4.0 Tube type: Oral Number of attempts: 1 Placement Confirmation: positive ETCO2 and breath sounds checked- equal and bilateral Tube secured with: Tape Dental Injury: Teeth and Oropharynx as per pre-operative assessment

## 2020-10-26 NOTE — Anesthesia Preprocedure Evaluation (Addendum)
Anesthesia Evaluation   Patient awake    Reviewed: Allergy & Precautions, NPO status , Patient's Chart, lab work & pertinent test results  History of Anesthesia Complications (+) Family history of anesthesia reaction  Airway Mallampati: III  TM Distance: <3 FB     Dental  (+) Caps, Teeth Intact   Pulmonary former smoker,    Pulmonary exam normal        Cardiovascular negative cardio ROS Normal cardiovascular exam     Neuro/Psych negative neurological ROS  negative psych ROS   GI/Hepatic Neg liver ROS, GERD  Medicated and Controlled,  Endo/Other  negative endocrine ROS  Renal/GU negative Renal ROS  negative genitourinary   Musculoskeletal negative musculoskeletal ROS (+)   Abdominal Normal abdominal exam  (+)   Peds negative pediatric ROS (+)  Hematology negative hematology ROS (+)   Anesthesia Other Findings Past Medical History: No date: Family history of adverse reaction to anesthesia     Comment:  adopted No date: GERD (gastroesophageal reflux disease) Left ventricular ejection fraction, by estimation, is 60 to 65%.  Reproductive/Obstetrics                             Anesthesia Physical  Anesthesia Plan  ASA: 2  Anesthesia Plan: General   Post-op Pain Management:    Induction: Intravenous  PONV Risk Score and Plan: 2 and Ondansetron and Midazolam  Airway Management Planned: LMA  Additional Equipment:   Intra-op Plan:   Post-operative Plan: Extubation in OR  Informed Consent: I have reviewed the patients History and Physical, chart, labs and discussed the procedure including the risks, benefits and alternatives for the proposed anesthesia with the patient or authorized representative who has indicated his/her understanding and acceptance.     Dental advisory given  Plan Discussed with: CRNA, Surgeon and Anesthesiologist  Anesthesia Plan Comments:        Anesthesia Quick Evaluation

## 2020-10-26 NOTE — Op Note (Signed)
Preoperative diagnosis: Left breast carcinoma.  Postoperative diagnosis: Left breast carcinoma.   Procedure: Left radiofrequency tag-localized partial mastectomy.                      Left Axillary Sentinel Lymph node biopsy  Anesthesia: GETA  Surgeon: Dr. Windell Moment  Wound Classification: Clean  Indications: Patient is a 65 y.o. female with a nonpalpable left breast mass noted on mammography with core biopsy demonstrating invasive mammary carcinoma requires radiofrequency tag-localized partial mastectomy for treatment with sentinel lymph node biopsy.   Findings: 1. Specimen mammography shows marker and tag on specimen 2. Pathology call refers gross examination of margins was clear 3. No other palpable mass or lymph node identified.   Description of procedure: Preoperative radiofrequency tag localization was performed by radiology. In the nuclear medicine suite, the subareolar region was injected with Tc-99 sulfur colloid. Localization studies were reviewed. The patient was taken to the operating room and placed supine on the operating table, and after general anesthesia the left chest and axilla were prepped and draped in the usual sterile fashion. A time-out was completed verifying correct patient, procedure, site, positioning, and implant(s) and/or special equipment prior to beginning this procedure.  By comparing the localization studies and interrogation with Localizer device, the probable trajectory and location of the mass was visualized. A circumareolar skin incision was planned in such a way to include the nipple areolar complex. The skin incision was made. Flaps were raised and the location of the tag was confirmed with Localizer device confirmed. A 2-0 silk figure-of-eight stay suture was placed and used for retraction. Dissection was then taken down circumferentially, taking care to include the entire localizing tag and a wide margin of grossly normal tissue. The specimen and  entire localizing tag were removed. The specimen was oriented and sent to radiology with the localization studies. Confirmation was received that the entire target lesion had been resected. The wound was irrigated. Hemostasis was checked. The wound was closed with interrupted sutures of 3-0 Vicryl and a subcuticular suture of Monocryl 3-0. No attempt was made to close the dead space.   A hand-held gamma probe was used to identify the location of the hottest spot in the axilla. An incision was made around the caudal axillary hairline. Dissection was carried down until subdermal facias was advanced. The probe was placed and again, the point of maximal count was found. Dissection continue until nodule was identified. The probe was placed in contact with the node. The node was excised in its entirety.  An additional hot spot was detected and the node was excised in similar fashion. No additional hot spots were identified. No clinically abnormal nodes were palpated. The procedure was terminated. Hemostasis was achieved and the wound closed in layers with deep interrupted 3-0 Vicryl and skin was closed with subcuticular suture of Monocryl 3-0.  The patient tolerated the procedure well and was taken to the postanesthesia care unit in stable condition.   Sentinel Node Biopsy Synoptic Operative Report  Operation performed with curative intent:Yes  Tracer(s) used to identify sentinel nodes in the upfront surgery (non-neoadjuvant) setting (select all that apply):Radioactive Tracer  Tracer(s) used to identify sentinel nodes in the neoadjuvant setting (select all that apply):N/A  All nodes (colored or non-colored) present at the end of a dye-filled lymphatic channel were removed:N/A  All significantly radioactive nodes were removed:Yes  All palpable suspicious nodes were removed:N/A  Biopsy-proven positive nodes marked with clips prior to chemotherapy were identified  and removed:N/A  Specimen: Left Breast  mass                     Sentinel Lymph nodes #1, #2  Complications: None  Estimated Blood Loss: 10 mL

## 2020-10-26 NOTE — Transfer of Care (Signed)
Immediate Anesthesia Transfer of Care Note  Patient: Wendy Caldwell  Procedure(s) Performed: PART [place] SENTINEL NODE BIOPSY (Left: Breast)  Patient Location: PACU  Anesthesia Type:General  Level of Consciousness: awake  Airway & Oxygen Therapy: Patient Spontanous Breathing and Patient connected to face mask oxygen  Post-op Assessment: Report given to RN and Post -op Vital signs reviewed and stable  Post vital signs: Reviewed and stable  Last Vitals:  Vitals Value Taken Time  BP    Temp    Pulse    Resp    SpO2      Last Pain:  Vitals:   10/26/20 0846  TempSrc: Oral  PainSc: 0-No pain         Complications: No notable events documented.

## 2020-10-26 NOTE — Interval H&P Note (Signed)
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  The various methods of treatment have been discussed with the patient and family. After consideration of risks, benefits and other options for treatment, the patient has consented to  Procedure(s): PART Clever (Left) as a surgical intervention.  The patient's history has been reviewed, patient examined, no change in status, stable for surgery.  I have reviewed the patient's chart and labs.  Questions were answered to the patient's satisfaction.     Herbert Pun

## 2020-10-26 NOTE — Anesthesia Postprocedure Evaluation (Signed)
Anesthesia Post Note  Patient: Wendy Caldwell  Procedure(s) Performed: PART MASTECTOMY,RADIO FREQUENCY LOCALIZER,AXILLARY SENTINEL NODE BIOPSY (Left: Breast)  Anesthesia Type: General Anesthetic complications: no   No notable events documented.   Last Vitals:  Vitals:   10/26/20 1202 10/26/20 1234  BP: 131/75 117/61  Pulse: 84 80  Resp: 16 16  Temp: (!) 36.3 C   SpO2: 98% 98%    Last Pain:  Vitals:   10/26/20 1234  TempSrc:   PainSc: 3                  Phill Mutter

## 2020-10-26 NOTE — Discharge Instructions (Addendum)
  Diet: Resume home heart healthy regular diet.   Activity: Increase activity as tolerated, but light activity and walking are encouraged. Do not drive or drink alcohol if taking narcotic pain medications.  Wound care: May shower with soapy water and pat dry (do not rub incisions), but no baths or submerging incision underwater until follow-up. (no swimming)   Medications: Resume all home medications. For mild to moderate pain: acetaminophen (Tylenol) or ibuprofen (if no kidney disease). Combining Tylenol with alcohol can substantially increase your risk of causing liver disease. Narcotic pain medications, if prescribed, can be used for severe pain, though may cause nausea, constipation, and drowsiness. Do not combine Tylenol and Norco within a 6 hour period as Norco contains Tylenol. If you do not need the narcotic pain medication, you do not need to fill the prescription.  Call office [phone]) at any time if any questions, worsening pain, fevers/chills, bleeding, drainage from incision site, or other concerns.   AMBULATORY SURGERY  DISCHARGE INSTRUCTIONS   The drugs that you were given will stay in your system until tomorrow so for the next 24 hours you should not:  Drive an automobile Make any legal decisions Drink any alcoholic beverage   You may resume regular meals tomorrow.  Today it is better to start with liquids and gradually work up to solid foods.  You may eat anything you prefer, but it is better to start with liquids, then soup and crackers, and gradually work up to solid foods.   Please notify your doctor immediately if you have any unusual bleeding, trouble breathing, redness and pain at the surgery site, drainage, fever, or pain not relieved by medication.   1 part rubbing alcohol 2 parts water in small ziplock bag.  Put in the freezer wrap in cloth and apply to area. Tylenol 500 mg every 8 hours alternately with ibuprofen '800mg'$  every 8 hours. If you need  hydrocodone you can take it also.                              Additional Instructions:

## 2020-10-29 ENCOUNTER — Inpatient Hospital Stay (HOSPITAL_BASED_OUTPATIENT_CLINIC_OR_DEPARTMENT_OTHER): Payer: BC Managed Care – PPO | Admitting: Oncology

## 2020-10-29 ENCOUNTER — Other Ambulatory Visit: Payer: Self-pay | Admitting: Anatomic Pathology & Clinical Pathology

## 2020-10-29 ENCOUNTER — Encounter: Payer: Self-pay | Admitting: Oncology

## 2020-10-29 ENCOUNTER — Inpatient Hospital Stay: Payer: BC Managed Care – PPO

## 2020-10-29 VITALS — BP 133/81 | HR 79 | Temp 99.7°F | Resp 18 | Wt 176.5 lb

## 2020-10-29 DIAGNOSIS — C50412 Malignant neoplasm of upper-outer quadrant of left female breast: Secondary | ICD-10-CM | POA: Diagnosis not present

## 2020-10-29 DIAGNOSIS — Z79899 Other long term (current) drug therapy: Secondary | ICD-10-CM | POA: Diagnosis not present

## 2020-10-29 DIAGNOSIS — Z7189 Other specified counseling: Secondary | ICD-10-CM

## 2020-10-29 DIAGNOSIS — Z170 Estrogen receptor positive status [ER+]: Secondary | ICD-10-CM

## 2020-10-29 DIAGNOSIS — R7989 Other specified abnormal findings of blood chemistry: Secondary | ICD-10-CM

## 2020-10-29 DIAGNOSIS — R718 Other abnormality of red blood cells: Secondary | ICD-10-CM | POA: Diagnosis not present

## 2020-10-29 DIAGNOSIS — R21 Rash and other nonspecific skin eruption: Secondary | ICD-10-CM | POA: Diagnosis not present

## 2020-10-29 DIAGNOSIS — R5383 Other fatigue: Secondary | ICD-10-CM | POA: Diagnosis not present

## 2020-10-29 LAB — CBC WITH DIFFERENTIAL/PLATELET
Abs Immature Granulocytes: 0.03 10*3/uL (ref 0.00–0.07)
Basophils Absolute: 0 10*3/uL (ref 0.0–0.1)
Basophils Relative: 1 %
Eosinophils Absolute: 0.3 10*3/uL (ref 0.0–0.5)
Eosinophils Relative: 4 %
HCT: 29.2 % — ABNORMAL LOW (ref 36.0–46.0)
Hemoglobin: 9.2 g/dL — ABNORMAL LOW (ref 12.0–15.0)
Immature Granulocytes: 0 %
Lymphocytes Relative: 28 %
Lymphs Abs: 2.3 10*3/uL (ref 0.7–4.0)
MCH: 22.2 pg — ABNORMAL LOW (ref 26.0–34.0)
MCHC: 31.5 g/dL (ref 30.0–36.0)
MCV: 70.4 fL — ABNORMAL LOW (ref 80.0–100.0)
Monocytes Absolute: 0.8 10*3/uL (ref 0.1–1.0)
Monocytes Relative: 10 %
Neutro Abs: 4.6 10*3/uL (ref 1.7–7.7)
Neutrophils Relative %: 57 %
Platelets: 319 10*3/uL (ref 150–400)
RBC: 4.15 MIL/uL (ref 3.87–5.11)
RDW: 19.3 % — ABNORMAL HIGH (ref 11.5–15.5)
WBC: 8.1 10*3/uL (ref 4.0–10.5)
nRBC: 0 % (ref 0.0–0.2)

## 2020-10-29 LAB — TSH: TSH: 0.404 u[IU]/mL (ref 0.350–4.500)

## 2020-10-29 LAB — COMPREHENSIVE METABOLIC PANEL
ALT: 51 U/L — ABNORMAL HIGH (ref 0–44)
AST: 37 U/L (ref 15–41)
Albumin: 3.5 g/dL (ref 3.5–5.0)
Alkaline Phosphatase: 86 U/L (ref 38–126)
Anion gap: 6 (ref 5–15)
BUN: 12 mg/dL (ref 8–23)
CO2: 26 mmol/L (ref 22–32)
Calcium: 8.7 mg/dL — ABNORMAL LOW (ref 8.9–10.3)
Chloride: 108 mmol/L (ref 98–111)
Creatinine, Ser: 0.62 mg/dL (ref 0.44–1.00)
GFR, Estimated: >60 mL/min (ref >60)
Glucose, Bld: 96 mg/dL (ref 70–99)
Potassium: 3.7 mmol/L (ref 3.5–5.1)
Sodium: 140 mmol/L (ref 135–145)
Total Bilirubin: 0.9 mg/dL (ref 0.3–1.2)
Total Protein: 6.4 g/dL — ABNORMAL LOW (ref 6.5–8.1)

## 2020-10-29 LAB — SURGICAL PATHOLOGY

## 2020-10-31 ENCOUNTER — Encounter: Payer: Self-pay | Admitting: Oncology

## 2020-10-31 NOTE — Progress Notes (Signed)
DISCONTINUE ON PATHWAY REGIMEN - Breast     A cycle is every 21 days:     Pertuzumab      Pertuzumab      Trastuzumab-xxxx      Trastuzumab-xxxx      Carboplatin      Docetaxel   **Always confirm dose/schedule in your pharmacy ordering system**  REASON: Toxicities / Adverse Event PRIOR TREATMENT: [id number]: Docetaxel + Carboplatin + Trastuzumab IV + Pertuzumab IV (TCHP IV) q21 Days x 6 Cycles TREATMENT RESPONSE: Complete Response (CR)  START ON PATHWAY REGIMEN - Breast     A cycle is every 21 days:     Trastuzumab-xxxx      Pertuzumab   **Always confirm dose/schedule in your pharmacy ordering system**  Patient Characteristics: Post-Neoadjuvant Therapy and Resection, HER2 Positive, ER Positive, No Residual Disease, Adjuvant Targeted Therapy After Neoadjuvant Chemo/Targeted Therapy Therapeutic Status: Post-Neoadjuvant Therapy and Resection Residual Invasive Disease Post-Neoadjuvant Therapy<= No ER Status: Positive (+) HER2 Status: Positive (+) PR Status: Negative (-) Intent of Therapy: Curative Intent, Discussed with Patient

## 2020-10-31 NOTE — Progress Notes (Signed)
ON PATHWAY REGIMEN - Breast  No Change  Continue With Treatment as Ordered.  Original Decision Date/Time: 07/12/2020 16:19     A cycle is every 21 days:     Pertuzumab      Pertuzumab      Trastuzumab-xxxx      Trastuzumab-xxxx      Carboplatin      Docetaxel   **Always confirm dose/schedule in your pharmacy ordering system**  Patient Characteristics: Preoperative or Nonsurgical Candidate (Clinical Staging), Neoadjuvant Therapy followed by Surgery, Invasive Disease, Chemotherapy, HER2 Positive, ER Positive Therapeutic Status: Preoperative or Nonsurgical Candidate (Clinical Staging) AJCC M Category: cM0 AJCC Grade: G3 Breast Surgical Plan: Neoadjuvant Therapy followed by Surgery ER Status: Positive (+) AJCC 8 Stage Grouping: IIA HER2 Status: Positive (+) AJCC T Category: cT2 AJCC N Category: cN0 PR Status: Negative (-) Intent of Therapy: Curative Intent, Discussed with Patient

## 2020-10-31 NOTE — Progress Notes (Signed)
I connected with Wendy Caldwell on 10/31/20 at  1:15 PM EDT by video enabled telemedicine visit and verified that I am speaking with the correct person using two identifiers.   I discussed the limitations, risks, security and privacy concerns of performing an evaluation and management service by telemedicine and the availability of in-person appointments. I also discussed with the patient that there may be a patient responsible charge related to this service. The patient expressed understanding and agreed to proceed.  Other persons participating in the visit and their role in the encounter:  none  Patient's location:  cancer center Provider's location:  home  Chief Complaint:  discuss final pathology results and further management  History of present illness: Patient is a 65 year old female with a past medical history significant for psoriasis for which she is on Humira.  She self palpated a left breast mass which prompted a bilateral diagnostic mammogram on 06/25/2020.  That showed an irregular hypoechoic mass 2.5 x 1.7 x 1.8 cm at 1 o'clock position 1 cm from the nipple.  3 cm from the nipple were benign cysts.  1 mildly enlarged left axillary lymph node with cortical thickening 4 mm.  Both the breast mass and the lymph node were biopsied.  Left breast biopsy was consistent with invasive mammary carcinoma grade 3 ER weakly +1 to 10%, PR negative and HER2 positive by IHC.  Patient has baseline microcytosis with a hemoglobin of around 12.  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   Patient completed 4 cycles of neoadjuvant TCHP chemotherapy kin July 2022.  She has been feeling more fatigued with chemotherapy and also developed significant skin rash involving her bilateral hands, left axilla as well as back causing significant discomfort.  Plan was therefore to stop 2 cycles and proceed with lumpectomy and  SLNB on 10/26/2020.  Final pathology showed benign mammary parenchyma with focal atypical not lobular hyperplasia but no evidence of residual invasive mammary carcinoma.  2 sentinel lymph nodes negative for malignancy.  Overall cancer cellularity 0% PCR.  YPT0N0.    Interval history: Skin rash around her fingers as well as left axilla has nearly resolved per patient.  She is now off steroids.  She is recovering well from her surgery.   Review of Systems  Constitutional:  Positive for malaise/fatigue. Negative for chills, fever and weight loss.  HENT:  Negative for congestion, ear discharge and nosebleeds.   Eyes:  Negative for blurred vision.  Respiratory:  Negative for cough, hemoptysis, sputum production, shortness of breath and wheezing.   Cardiovascular:  Negative for chest pain, palpitations, orthopnea and claudication.  Gastrointestinal:  Negative for abdominal pain, blood in stool, constipation, diarrhea, heartburn, melena, nausea and vomiting.  Genitourinary:  Negative for dysuria, flank pain, frequency, hematuria and urgency.  Musculoskeletal:  Negative for back pain, joint pain and myalgias.  Skin:  Negative for rash.  Neurological:  Negative for dizziness, tingling, focal weakness, seizures, weakness and headaches.  Endo/Heme/Allergies:  Does not bruise/bleed easily.  Psychiatric/Behavioral:  Negative for depression and suicidal ideas. The patient does not have insomnia.    No Known Allergies  Past Medical History:  Diagnosis Date   Family history of adverse reaction to anesthesia    adopted   GERD (gastroesophageal reflux disease)     Past Surgical History:  Procedure Laterality Date   BREAST BIOPSY Left 07/05/2020   [place] Bx, Q clip, IMC   BREAST  BIOPSY Left 07/05/2020   [place] Bx, Vision, benign breast tissue with increased stromal fibrosis   CARPAL TUNNEL RELEASE Bilateral    CHOLECYSTECTOMY     GANGLION CYST EXCISION     wrist   PART MASTECTOMY,RADIO FREQUENCY  LOCALIZER,AXILLARY SENTINEL NODE BIOPSY Left 10/26/2020   Procedure: PART MASTECTOMY,RADIO FREQUENCY LOCALIZER,AXILLARY SENTINEL NODE BIOPSY;  Surgeon: Herbert Pun, MD;  Location: [facility] ORS;  Service: General;  Laterality: Left;   PORTACATH PLACEMENT Right 07/21/2020   Procedure: INSERTION PORT-A-CATH;  Surgeon: Herbert Pun, MD;  Location: [facility] ORS;  Service: General;  Laterality: Right;   right ovary removal      Social History   Socioeconomic History   Marital status: Married    Spouse name: Not on file   Number of children: Not on file   Years of education: Not on file   Highest education level: Not on file  Occupational History   Not on file  Tobacco Use   Smoking status: Former    Types: Cigarettes    Start date: 2012   Smokeless tobacco: Never  Vaping Use   Vaping Use: Never used  Substance and Sexual Activity   Alcohol use: Yes    Comment: occassional    Drug use: Never   Sexual activity: Not on file  Other Topics Concern   Not on file  Social History Narrative   Not on file   Social Determinants of Health   Financial Resource Strain: Not on file  Food Insecurity: Not on file  Transportation Needs: Not on file  Physical Activity: Not on file  Stress: Not on file  Social Connections: Not on file  Intimate Partner Violence: Not on file    Family History  Adopted: Yes  Problem Relation Age of Onset   Breast cancer Neg Hx      Current Outpatient Medications:    dexamethasone (DECADRON) 4 MG tablet, Take 2 tablets (8 mg total) by mouth 2 (two) times daily. Start the day before Taxotere. Then take daily x 3 days after chemotherapy., Disp: 30 tablet, Rfl: 2   lidocaine-prilocaine (EMLA) cream, Apply 1 application topically daily as needed (prior to port access)., Disp: 30 g, Rfl: 2   Multiple Vitamins-Minerals (EYE-VITE PLUS LUTEIN PO), Take 1 capsule by mouth 2 (two) times daily. AREDS2, Disp: , Rfl:    Multiple Vitamins-Minerals  (MULTIVITAMIN WITH MINERALS) tablet, Take 1 tablet by mouth daily., Disp: , Rfl:    vitamin B-12 (CYANOCOBALAMIN) 1000 MCG tablet, Take 1,000 mcg by mouth daily., Disp: , Rfl:    vitamin C (ASCORBIC ACID) 500 MG tablet, Take 500 mg by mouth daily., Disp: , Rfl:    vitamin E 180 MG (400 UNITS) capsule, Take 400 Units by mouth daily., Disp: , Rfl:    benzonatate (TESSALON) 100 MG capsule, Take 100 mg by mouth daily as needed for cough. (Patient not taking: Reported on 10/29/2020), Disp: , Rfl:    loratadine-pseudoephedrine (CLARITIN-D 24 HOUR) 10-240 MG 24 hr tablet, Take 1 tablet by mouth daily. (Patient not taking: Reported on 10/29/2020), Disp: 30 tablet, Rfl: 0   LORazepam (ATIVAN) 0.5 MG tablet, Take 1 tablet (0.5 mg total) by mouth every 6 (six) hours as needed (Nausea or vomiting). (Patient not taking: No sig reported), Disp: 30 tablet, Rfl: 0   omeprazole (PRILOSEC) 40 MG capsule, Take 1 capsule (40 mg total) by mouth daily. (Patient not taking: Reported on 10/29/2020), Disp: 30 capsule, Rfl: 0   ondansetron (ZOFRAN) 8 MG tablet, Take 1  tablet (8 mg total) by mouth 2 (two) times daily as needed (Nausea or vomiting). Start on the third day after chemotherapy. (Patient not taking: Reported on 10/29/2020), Disp: 30 tablet, Rfl: 1   ondansetron (ZOFRAN-ODT) 4 MG disintegrating tablet, Take 4 mg by mouth daily as needed for nausea/vomiting. (Patient not taking: Reported on 10/29/2020), Disp: , Rfl:    Polyethylene Glycol 400 (BLINK TEARS) 0.25 % SOLN, Apply 1 drop to eye 5 (five) times daily as needed. Every 2 hrs daily (Patient not taking: Reported on 10/29/2020), Disp: , Rfl:    predniSONE (DELTASONE) 10 MG tablet, Take 1 tablet (10 mg total) by mouth daily with breakfast. Start with 5 tablets daily x 3 days , then 4 tablets x 3 day, then 3 tablets x 3 days, then 2 tablets x 3 days and then 1 tablet x 3 days (Patient not taking: Reported on 10/29/2020), Disp: 45 tablet, Rfl: 0   prochlorperazine (COMPAZINE)  10 MG tablet, Take 1 tablet (10 mg total) by mouth every 6 (six) hours as needed (Nausea or vomiting). (Patient not taking: No sig reported), Disp: 30 tablet, Rfl: 1   triamcinolone ointment (KENALOG) 0.5 %, Apply 1 application topically 2 (two) times daily. (Patient not taking: Reported on 10/29/2020), Disp: 30 g, Rfl: 0  NM Sentinel Node Inj-No Rpt (Breast)  Result Date: 10/26/2020 Sulfur Colloid was injected by the Nuclear Medicine Technologist for sentinel lymph node localization.   MM Breast Surgical Specimen  Result Date: 10/26/2020 CLINICAL DATA:  Specimen radiograph status post left breast lumpectomy. EXAM: SPECIMEN RADIOGRAPH OF THE LEFT BREAST COMPARISON:  Previous exam(s). FINDINGS: Status post excision of the left breast. The radiofrequency tag and biopsy marker clip are present and completely intact within the specimen. IMPRESSION: Specimen radiograph of the left breast. Electronically Signed   By: Ammie Ferrier M.D.   On: 10/26/2020 10:35  MM DIAG BREAST TOMO UNI LEFT  Result Date: 10/20/2020 CLINICAL DATA:  Mammogram following radiofrequency tag localization of the left breast. EXAM: 3D DIAGNOSTIC LEFT MAMMOGRAM POST ULTRASOUND BIOPSY COMPARISON:  Previous exam(s). FINDINGS: 3D Mammographic images were obtained following ultrasound guided radiofrequency tag localization of a mass in the retroareolar left breast. The radiofrequency tag is seen adjacent to the Q shaped biopsy marking clip in the retroareolar left breast. IMPRESSION: Appropriate positioning of the radiofrequency tag in the retroareolar left breast mass, adjacent to the Q shaped biopsy marking clip. Final Assessment: Post Procedure Mammograms for Marker Placement Electronically Signed   By: Ammie Ferrier M.D.   On: 10/20/2020 16:22  ECHOCARDIOGRAM COMPLETE  Result Date: 10/18/2020    ECHOCARDIOGRAM REPORT   Patient Name:   Wendy Caldwell [facility] Date of Exam: 10/18/2020 Medical Rec #:  [id number]          Height:        63.0 in Accession #:    [id number]         Weight:       173.9 lb Date of Birth:  1956-02-20         BSA:          1.822 m Patient Age:    80 years           BP:           112/64 mmHg Patient Gender: F                  HR:           88 bpm. Exam Location:  [facility] Procedure: 2D Echo, Cardiac Doppler, Color Doppler and Strain Analysis Indications:     Chemo Z09  History:         Patient has prior history of Echocardiogram examinations, most                  recent 07/20/2020. GERD.  Sonographer:     Sherrie Sport RDCS (AE) Referring Phys:  [id number] Weston Anna Roshelle Traub Diagnosing Phys: Kathlyn Sacramento MD  Sonographer Comments: Global longitudinal strain was attempted. IMPRESSIONS  1. Left ventricular ejection fraction, by estimation, is 60 to 65%. The left ventricle has normal function. The left ventricle has no regional wall motion abnormalities. There is mild left ventricular hypertrophy. Left ventricular diastolic parameters were normal. The average left ventricular global longitudinal strain is -16.8 %. The global longitudinal strain is normal.  2. Right ventricular systolic function is normal. The right ventricular size is normal.  3. The mitral valve is normal in structure. No evidence of mitral valve regurgitation. No evidence of mitral stenosis.  4. The aortic valve is normal in structure. Aortic valve regurgitation is not visualized. No aortic stenosis is present.  5. The inferior vena cava is normal in size with greater than 50% respiratory variability, suggesting right atrial pressure of 3 mmHg. FINDINGS  Left Ventricle: Left ventricular ejection fraction, by estimation, is 60 to 65%. The left ventricle has normal function. The left ventricle has no regional wall motion abnormalities. The average left ventricular global longitudinal strain is -16.8 %. The global longitudinal strain is normal. The left ventricular internal cavity size was normal in size. There is mild left ventricular hypertrophy. Left ventricular  diastolic parameters were normal. Right Ventricle: The right ventricular size is normal. No increase in right ventricular wall thickness. Right ventricular systolic function is normal. Left Atrium: Left atrial size was normal in size. Right Atrium: Right atrial size was normal in size. Pericardium: There is no evidence of pericardial effusion. Mitral Valve: The mitral valve is normal in structure. No evidence of mitral valve regurgitation. No evidence of mitral valve stenosis. Tricuspid Valve: The tricuspid valve is normal in structure. Tricuspid valve regurgitation is trivial. No evidence of tricuspid stenosis. Aortic Valve: The aortic valve is normal in structure. Aortic valve regurgitation is not visualized. No aortic stenosis is present. Aortic valve mean gradient measures 5.0 mmHg. Aortic valve peak gradient measures 8.4 mmHg. Aortic valve area, by VTI measures 2.46 cm. Pulmonic Valve: The pulmonic valve was normal in structure. Pulmonic valve regurgitation is not visualized. No evidence of pulmonic stenosis. Aorta: The aortic root is normal in size and structure. Venous: The inferior vena cava is normal in size with greater than 50% respiratory variability, suggesting right atrial pressure of 3 mmHg. IAS/Shunts: No atrial level shunt detected by color flow Doppler.  LEFT VENTRICLE PLAX 2D LVIDd:         2.45 cm  Diastology LVIDs:         1.78 cm  LV e' medial:    7.18 cm/s LV PW:         1.40 cm  LV E/e' medial:  9.2 LV IVS:        0.91 cm  LV e' lateral:   12.30 cm/s LVOT diam:     2.00 cm  LV E/e' lateral: 5.4 LV SV:         73 LV SV Index:   40       2D Longitudinal Strain LVOT Area:     3.14 cm  2D Strain GLS Avg:     -16.8 %                          3D Volume EF:                         3D EF:        57 %                         LV EDV:       110 ml                         LV ESV:       48 ml                         LV SV:        62 ml RIGHT VENTRICLE RV Basal diam:  2.54 cm RV S prime:     18.00 cm/s  TAPSE (M-mode): 3.7 cm LEFT ATRIUM             Index       RIGHT ATRIUM          Index LA diam:        3.20 cm 1.76 cm/m  RA Area:     8.56 cm LA Vol (A2C):   47.9 ml 26.29 ml/m RA Volume:   14.50 ml 7.96 ml/m LA Vol (A4C):   43.3 ml 23.76 ml/m LA Biplane Vol: 47.5 ml 26.07 ml/m  AORTIC VALVE                    PULMONIC VALVE AV Area (Vmax):    2.30 cm     PV Vmax:        0.89 m/s AV Area (Vmean):   2.23 cm     PV Peak grad:   3.2 mmHg AV Area (VTI):     2.46 cm     RVOT Peak grad: 5 mmHg AV Vmax:           145.00 cm/s AV Vmean:          106.000 cm/s AV VTI:            0.296 m AV Peak Grad:      8.4 mmHg AV Mean Grad:      5.0 mmHg LVOT Vmax:         106.00 cm/s LVOT Vmean:        75.200 cm/s LVOT VTI:          0.232 m LVOT/AV VTI ratio: 0.78  AORTA Ao Root diam: 2.80 cm MITRAL VALVE               TRICUSPID VALVE MV Area (PHT): 3.61 cm    TR Peak grad:   26.8 mmHg MV Decel Time: 210 msec    TR Vmax:        259.00 cm/s MV E velocity: 66.40 cm/s MV A velocity: 63.80 cm/s  SHUNTS MV E/A ratio:  1.04        Systemic VTI:  0.23 m                            Systemic Diam: 2.00 cm Kathlyn Sacramento MD Electronically signed by Kathlyn Sacramento MD Signature  Date/Time: 10/18/2020/2:58:20 PM    Final    [place] LT RADIO FREQUENCY TAG LOC US GUIDE  Result Date: 10/20/2020 CLINICAL DATA:  Radiofrequency tag localization of the left breast prior to lumpectomy. EXAM: NEEDLE LOCALIZATION OF THE LEFT BREAST WITH ULTRASOUND GUIDANCE COMPARISON:  Previous exams. FINDINGS: Patient presents for needle localization prior to left breast lumpectomy. I met with the patient and we discussed the procedure of needle localization including benefits and alternatives. We discussed the high likelihood of a successful procedure. We discussed the risks of the procedure, including infection, bleeding, tissue injury, and further surgery. Informed, written consent was given. The usual time-out protocol was performed immediately prior to the  procedure. Using ultrasound guidance, sterile technique, 1% lidocaine the mass in the retroareolar left breast with the Q shaped biopsy marking clip was localized using a lateral approach. The radiofrequency tag was deployed successfully. IMPRESSION: Radar reflector localization of the left breast. No apparent complications. Electronically Signed   By: Ammie Ferrier M.D.   On: 10/20/2020 16:19   No images are attached to the encounter.   CMP Latest Ref Rng & Units 10/29/2020  Glucose 70 - 99 mg/dL 96  BUN 8 - 23 mg/dL 12  Creatinine 0.44 - 1.00 mg/dL 0.62  Sodium 135 - 145 mmol/L 140  Potassium 3.5 - 5.1 mmol/L 3.7  Chloride 98 - 111 mmol/L 108  CO2 22 - 32 mmol/L 26  Calcium 8.9 - 10.3 mg/dL 8.7(L)  Total Protein 6.5 - 8.1 g/dL 6.4(L)  Total Bilirubin 0.3 - 1.2 mg/dL 0.9  Alkaline Phos 38 - 126 U/L 86  AST 15 - 41 U/L 37  ALT 0 - 44 U/L 51(H)   CBC Latest Ref Rng & Units 10/29/2020  WBC 4.0 - 10.5 K/uL 8.1  Hemoglobin 12.0 - 15.0 g/dL 9.2(L)  Hematocrit 36.0 - 46.0 % 29.2(L)  Platelets 150 - 400 K/uL 319     Observation/objective: Appears in no acute distress over video visit today.  Breathing is nonlabored  Assessment and plan: Patient is a 65 year old female with a history of stage II aT2 N0 M0 grade 3 invasive mammary carcinoma of the left breast ER weakly positive PR negative and HER2 positive s/p 4 cycles of neoadjuvant TCHP chemotherapy followed by lumpectomy and sentinel lymph node biopsy here to discuss final pathology results and further management  Final pathology results were not back at the time of my visit with the patient but did come back later that evening and I called the patient with the results.Interim ultrasound after 3 cycles of TCHP chemotherapy did not show significant shrinkage in the size of her left breast mass which was still close to 2.5 x 1.3 x 1.2 cm after 3 cycles of TCHP chemotherapy.  Despite that final pathology after 4 cycles of TCHP chemotherapy  shows a complete pathological response with no residual tumor YPT0N0.  She therefore does not require further chemotherapy and can continue Herceptin and Perjeta every 3 weeks to complete 1 year of treatment at this time  If patient has significant issues tolerating this regimen and recurrence of her skin rash I will consider dropping perjeta on giving Herceptin alone for 1 year.  Patient verbalized understanding of the plan.  I will tentatively see her back in 2 weeks to start Herceptin and Perjeta.  I will also refer her to radiation oncology for consideration of adjuvant radiation treatment.  Treatment will be given with a curative intent.  Patient verbalized understanding of the plan  Follow-up instructions: As above  I discussed the assessment and treatment plan with the patient. The patient was provided an opportunity to ask questions and all were answered. The patient agreed with the plan and demonstrated an understanding of the instructions.   The patient was advised to call back or seek an in-person evaluation if the symptoms worsen or if the condition fails to improve as anticipated.  Visit Diagnosis: 1. Goals of care, counseling/discussion   2. Malignant neoplasm of upper-outer quadrant of left breast in female, estrogen receptor positive ([place])     Dr. Randa Evens, MD, MPH [facility] Tel- [phone] 10/31/2020 8:32 PM

## 2020-11-03 DIAGNOSIS — K219 Gastro-esophageal reflux disease without esophagitis: Secondary | ICD-10-CM | POA: Diagnosis not present

## 2020-11-03 DIAGNOSIS — L409 Psoriasis, unspecified: Secondary | ICD-10-CM | POA: Diagnosis not present

## 2020-11-03 DIAGNOSIS — R718 Other abnormality of red blood cells: Secondary | ICD-10-CM | POA: Diagnosis not present

## 2020-11-06 ENCOUNTER — Other Ambulatory Visit: Payer: Self-pay | Admitting: *Deleted

## 2020-11-06 DIAGNOSIS — C50412 Malignant neoplasm of upper-outer quadrant of left female breast: Secondary | ICD-10-CM

## 2020-11-08 ENCOUNTER — Ambulatory Visit
Admission: RE | Admit: 2020-11-08 | Discharge: 2020-11-08 | Disposition: A | Discharge status: Home / Self Care | Payer: BC Managed Care – PPO | Source: Ambulatory Visit | Attending: Radiation Oncology | Admitting: Radiation Oncology

## 2020-11-08 DIAGNOSIS — N6489 Other specified disorders of breast: Secondary | ICD-10-CM | POA: Diagnosis not present

## 2020-11-08 DIAGNOSIS — C50412 Malignant neoplasm of upper-outer quadrant of left female breast: Secondary | ICD-10-CM | POA: Diagnosis not present

## 2020-11-08 DIAGNOSIS — Z170 Estrogen receptor positive status [ER+]: Secondary | ICD-10-CM | POA: Diagnosis not present

## 2020-11-08 NOTE — Progress Notes (Signed)
Radiation Oncology Follow up Note  Name: Wendy Caldwell   Date:   11/08/2020 MRN:  [id number] DOB: [birth year]    This 65 y.o. female presents to the clinic today for reevaluation for rate radiation therapy to her left breast for stage IIa (cT2 N0 M0) ER weakly positive PR negative HER2/neu overexpressed who has completed neoadjuvant chemotherapy.  REFERRING PROVIDER: Kirk Ruths, MD  HPI: Patient is a 65 year old female who originally was consulted back in July when she presented with a stage IIa invasive mammary carcinoma of the left breast..  Initial mammogram confirmed a 2.5 x 1.7 cm mass at the 1 o'clock position 1 cm from the nipple.  Was 1 mildly enlarged left axilla lymph node with cortical thickening measuring 4 mm.  Lymph node biopsy was negative breast showed ER weakly positive PR negative and HER2 overexpressed invasive mammary carcinoma grade 3.  She had TCHP neoadjuvant chemotherapy then underwent wide local excision and sentinel node biopsy.  No residual cancer was found in her left breast and 2 sentinel lymph nodes were negative for metastatic disease.  She is currently receiving Herceptin and Perjeta and is now referred to ration collagen for consideration of treatment.  She specifically denies breast tenderness.  She is having some tenderness in the left axilla and she does have a seroma present there.  COMPLICATIONS OF TREATMENT: none  FOLLOW UP COMPLIANCE: keeps appointments   PHYSICAL EXAM:  BP (P) 140/82 (BP Location: Right Arm, Patient Position: Sitting)   Pulse (P) 90   Temp (!) (P) 97.2 F (36.2 C) (Tympanic)   Resp (P) 18   Wt (P) 173 lb 11.2 oz (78.8 kg)   BMI (P) 29.82 kg/m  Patient is status post wide local excision of the left breast there is a horizontal incision which involves the nipple areolar complex.  There is also a seroma present in her left axilla.  Right breast is free of dominant mass.  Well-developed well-nourished patient in NAD. HEENT  reveals PERLA, EOMI, discs not visualized.  Oral cavity is clear. No oral mucosal lesions are identified. Neck is clear without evidence of cervical or supraclavicular adenopathy. Lungs are clear to A&P. Cardiac examination is essentially unremarkable with regular rate and rhythm without murmur rub or thrill. Abdomen is benign with no organomegaly or masses noted. Motor sensory and DTR levels are equal and symmetric in the upper and lower extremities. Cranial nerves II through XII are grossly intact. Proprioception is intact. No peripheral adenopathy or edema is identified. No motor or sensory levels are noted. Crude visual fields are within normal range.  RADIOLOGY RESULTS: No current films for review  PLAN: At this time I recommended a hypofractionated course of whole breast radiation to her left breast.  I also boost her scar another 1000 cGy using electron beam.  Risks and benefits of treatment including skin reaction fatigue alteration of blood counts possible occlusion of superficial lung all were reviewed in detail with the patient.  She has an appointment with her surgeon this week for evaluation of her seroma in the left axilla.  That may need drainage.  Patient comprehends my recommendations well.  I would like to take this opportunity to thank you for allowing me to participate in the care of your patient.Noreene Filbert, MD

## 2020-11-11 ENCOUNTER — Ambulatory Visit
Admission: RE | Admit: 2020-11-11 | Discharge: 2020-11-11 | Disposition: A | Discharge status: Home / Self Care | Payer: BC Managed Care – PPO | Source: Ambulatory Visit | Attending: Radiation Oncology | Admitting: Radiation Oncology

## 2020-11-11 DIAGNOSIS — Z170 Estrogen receptor positive status [ER+]: Secondary | ICD-10-CM | POA: Diagnosis not present

## 2020-11-11 DIAGNOSIS — C50412 Malignant neoplasm of upper-outer quadrant of left female breast: Secondary | ICD-10-CM | POA: Insufficient documentation

## 2020-11-11 DIAGNOSIS — Z510 Encounter for antineoplastic radiation therapy: Secondary | ICD-10-CM | POA: Diagnosis not present

## 2020-11-12 ENCOUNTER — Inpatient Hospital Stay (HOSPITAL_BASED_OUTPATIENT_CLINIC_OR_DEPARTMENT_OTHER): Payer: BC Managed Care – PPO | Admitting: Oncology

## 2020-11-12 ENCOUNTER — Encounter: Payer: Self-pay | Admitting: Oncology

## 2020-11-12 ENCOUNTER — Inpatient Hospital Stay: Payer: BC Managed Care – PPO | Attending: Oncology

## 2020-11-12 ENCOUNTER — Inpatient Hospital Stay: Payer: BC Managed Care – PPO

## 2020-11-12 VITALS — BP 124/72 | HR 90 | Temp 98.9°F | Resp 16 | Wt 174.7 lb

## 2020-11-12 VITALS — BP 129/68 | HR 80 | Resp 16

## 2020-11-12 DIAGNOSIS — C50412 Malignant neoplasm of upper-outer quadrant of left female breast: Secondary | ICD-10-CM

## 2020-11-12 DIAGNOSIS — Z170 Estrogen receptor positive status [ER+]: Secondary | ICD-10-CM | POA: Insufficient documentation

## 2020-11-12 DIAGNOSIS — Z79899 Other long term (current) drug therapy: Secondary | ICD-10-CM | POA: Diagnosis not present

## 2020-11-12 DIAGNOSIS — Z5112 Encounter for antineoplastic immunotherapy: Secondary | ICD-10-CM

## 2020-11-12 LAB — CBC WITH DIFFERENTIAL/PLATELET
Abs Immature Granulocytes: 0.02 10*3/uL (ref 0.00–0.07)
Basophils Absolute: 0.1 10*3/uL (ref 0.0–0.1)
Basophils Relative: 1 %
Eosinophils Absolute: 0.4 10*3/uL (ref 0.0–0.5)
Eosinophils Relative: 6 %
HCT: 35.6 % — ABNORMAL LOW (ref 36.0–46.0)
Hemoglobin: 11.1 g/dL — ABNORMAL LOW (ref 12.0–15.0)
Immature Granulocytes: 0 %
Lymphocytes Relative: 33 %
Lymphs Abs: 2.5 10*3/uL (ref 0.7–4.0)
MCH: 21.9 pg — ABNORMAL LOW (ref 26.0–34.0)
MCHC: 31.2 g/dL (ref 30.0–36.0)
MCV: 70.1 fL — ABNORMAL LOW (ref 80.0–100.0)
Monocytes Absolute: 0.7 10*3/uL (ref 0.1–1.0)
Monocytes Relative: 10 %
Neutro Abs: 3.8 10*3/uL (ref 1.7–7.7)
Neutrophils Relative %: 50 %
Platelets: 309 10*3/uL (ref 150–400)
RBC: 5.08 MIL/uL (ref 3.87–5.11)
RDW: 15.5 % (ref 11.5–15.5)
WBC: 7.5 10*3/uL (ref 4.0–10.5)
nRBC: 0 % (ref 0.0–0.2)

## 2020-11-12 LAB — COMPREHENSIVE METABOLIC PANEL
ALT: 21 U/L (ref 0–44)
AST: 30 U/L (ref 15–41)
Albumin: 3.7 g/dL (ref 3.5–5.0)
Alkaline Phosphatase: 79 U/L (ref 38–126)
Anion gap: 8 (ref 5–15)
BUN: 16 mg/dL (ref 8–23)
CO2: 26 mmol/L (ref 22–32)
Calcium: 8.9 mg/dL (ref 8.9–10.3)
Chloride: 104 mmol/L (ref 98–111)
Creatinine, Ser: 0.64 mg/dL (ref 0.44–1.00)
GFR, Estimated: >60 mL/min (ref >60)
Glucose, Bld: 110 mg/dL — ABNORMAL HIGH (ref 70–99)
Potassium: 3.8 mmol/L (ref 3.5–5.1)
Sodium: 138 mmol/L (ref 135–145)
Total Bilirubin: 0.6 mg/dL (ref 0.3–1.2)
Total Protein: 6.8 g/dL (ref 6.5–8.1)

## 2020-11-12 MED ORDER — SODIUM CHLORIDE 0.9% FLUSH
10.0000 mL | Freq: Once | INTRAVENOUS | Status: AC
  Administered 2020-11-12: 10 mL via INTRAVENOUS
  Filled 2020-11-12: qty 10

## 2020-11-12 MED ORDER — HEPARIN SOD (PORK) LOCK FLUSH 100 UNIT/ML IV SOLN
INTRAVENOUS | Status: AC
  Administered 2020-11-12: 500 [IU] via INTRAVENOUS
  Filled 2020-11-12: qty 5

## 2020-11-12 MED ORDER — SODIUM CHLORIDE 0.9 % IV SOLN
420.0000 mg | Freq: Once | INTRAVENOUS | Status: AC
  Administered 2020-11-12: 420 mg via INTRAVENOUS
  Filled 2020-11-12: qty 14

## 2020-11-12 MED ORDER — HEPARIN SOD (PORK) LOCK FLUSH 100 UNIT/ML IV SOLN
500.0000 [IU] | Freq: Once | INTRAVENOUS | Status: AC
  Filled 2020-11-12: qty 5

## 2020-11-12 MED ORDER — TRASTUZUMAB-DKST CHEMO 150 MG IV SOLR
600.0000 mg | Freq: Once | INTRAVENOUS | Status: AC
  Administered 2020-11-12: 600 mg via INTRAVENOUS
  Filled 2020-11-12: qty 28.57

## 2020-11-12 MED ORDER — SODIUM CHLORIDE 0.9 % IV SOLN
Freq: Once | INTRAVENOUS | Status: AC
  Filled 2020-11-12: qty 250

## 2020-11-12 MED ORDER — ACETAMINOPHEN 325 MG PO TABS
650.0000 mg | ORAL_TABLET | Freq: Once | ORAL | Status: AC
  Administered 2020-11-12: 650 mg via ORAL
  Filled 2020-11-12: qty 2

## 2020-11-12 MED ORDER — DIPHENHYDRAMINE HCL 25 MG PO CAPS
50.0000 mg | ORAL_CAPSULE | Freq: Once | ORAL | Status: AC
  Administered 2020-11-12: 50 mg via ORAL
  Filled 2020-11-12: qty 2

## 2020-11-12 MED ORDER — HEPARIN SOD (PORK) LOCK FLUSH 100 UNIT/ML IV SOLN
500.0000 [IU] | Freq: Once | INTRAVENOUS | Status: DC | PRN
  Filled 2020-11-12: qty 5

## 2020-11-12 NOTE — Patient Instructions (Signed)
[facility]  Discharge Instructions: Thank you for choosing [facility] to provide your oncology and hematology care.  If you have a lab appointment with the [facility], please go directly to the [facility] and check in at the registration area.  Wear comfortable clothing and clothing appropriate for easy access to any Portacath or PICC line.   We strive to give you quality time with your provider. You may need to reschedule your appointment if you arrive late (15 or more minutes).  Arriving late affects you and other patients whose appointments are after yours.  Also, if you miss three or more appointments without notifying the office, you may be dismissed from the clinic at the provider's discretion.      For prescription refill requests, have your pharmacy contact our office and allow 72 hours for refills to be completed.    Today you received the following chemotherapy and/or immunotherapy agents Ogivri & Perjeta      To help prevent nausea and vomiting after your treatment, we encourage you to take your nausea medication as directed.  BELOW ARE SYMPTOMS THAT SHOULD BE REPORTED IMMEDIATELY: *FEVER GREATER THAN 100.4 F (38 C) OR HIGHER *CHILLS OR SWEATING *NAUSEA AND VOMITING THAT IS NOT CONTROLLED WITH YOUR NAUSEA MEDICATION *UNUSUAL SHORTNESS OF BREATH *UNUSUAL BRUISING OR BLEEDING *URINARY PROBLEMS (pain or burning when urinating, or frequent urination) *BOWEL PROBLEMS (unusual diarrhea, constipation, pain near the anus) TENDERNESS IN MOUTH AND THROAT WITH OR WITHOUT PRESENCE OF ULCERS (sore throat, sores in mouth, or a toothache) UNUSUAL RASH, SWELLING OR PAIN  UNUSUAL VAGINAL DISCHARGE OR ITCHING   Items with * indicate a potential emergency and should be followed up as soon as possible or go to the Emergency Department if any problems should occur.  Please show the CHEMOTHERAPY ALERT CARD or IMMUNOTHERAPY ALERT CARD at  check-in to the Emergency Department and triage nurse.  Should you have questions after your visit or need to cancel or reschedule your appointment, please contact [facility]  [phone] and follow the prompts.  Office hours are 8:00 a.m. to 4:30 p.m. Monday - Friday. Please note that voicemails left after 4:00 p.m. may not be returned until the following business day.  We are closed weekends and major holidays. You have access to a nurse at all times for urgent questions. Please call the main number to the clinic [phone] and follow the prompts.  For any non-urgent questions, you may also contact your provider using MyChart. We now offer e-Visits for anyone 3 and older to request care online for non-urgent symptoms. For details visit [email or web address].   Also download the MyChart app! Go to the app store, search "MyChart", open the app, select [facility], and log in with your MyChart username and password.  Due to Covid, a mask is required upon entering the hospital/clinic. If you do not have a mask, one will be given to you upon arrival. For doctor visits, patients may have 1 support person aged 69 or older with them. For treatment visits, patients cannot have anyone with them due to current Covid guidelines and our immunocompromised population.

## 2020-11-12 NOTE — Progress Notes (Signed)
Hematology/Oncology Consult note [facility]  Telephone:([phone] [phone]  Patient Care Team: Kirk Ruths, MD as PCP - General (Internal Medicine) Rico Junker, RN as Oncology Nurse Navigator   Name of the patient: Wendy Caldwell  [id number]  02-10-1956   Date of visit: 11/12/20  Diagnosis-  left breast cancer clinical prognostic stage IIA cT2 N0 M0 ER weakly positive, PR negative HER2 positive  Chief complaint/ Reason for visit-on treatment assessment prior to cycle 1 of adjuvant Herceptin and Perjeta  Heme/Onc history: Patient is a 65 year old female with a past medical history significant for psoriasis for which she is on Humira.  She self palpated a left breast mass which prompted a bilateral diagnostic mammogram on 06/25/2020.  That showed an irregular hypoechoic mass 2.5 x 1.7 x 1.8 cm at 1 o'clock position 1 cm from the nipple.  3 cm from the nipple were benign cysts.  1 mildly enlarged left axillary lymph node with cortical thickening 4 mm.  Both the breast mass and the lymph node were biopsied.  Left breast biopsy was consistent with invasive mammary carcinoma grade 3 ER weakly +1 to 10%, PR negative and HER2 positive by IHC.  Patient completed 4 cycles of neoadjuvant TCHP chemotherapy which was complicated by worsening skin rash and fatigue.  Patient did not wish to proceed with 2 further cycles of chemotherapy and proceeded with lumpectomy and sentinel lymph node biopsy.  Final pathology showed complete pathological response on 10/26/2020.  2 sentinel lymph nodes negative for malignancy.  Plan is to proceed with adjuvant Herceptin and Perjeta for 1 year as well as adjuvant radiation treatment and discussion for endocrine therapy down the line.  Interval history-patient reports doing well overall.  She does have a seroma in her left breast which was drained once and May need to be drained again.  ECOG PS- 1 Pain scale-  0   Review of systems- Review of Systems  Constitutional:  Positive for malaise/fatigue. Negative for chills, fever and weight loss.  HENT:  Negative for congestion, ear discharge and nosebleeds.   Eyes:  Negative for blurred vision.  Respiratory:  Negative for cough, hemoptysis, sputum production, shortness of breath and wheezing.   Cardiovascular:  Negative for chest pain, palpitations, orthopnea and claudication.  Gastrointestinal:  Negative for abdominal pain, blood in stool, constipation, diarrhea, heartburn, melena, nausea and vomiting.  Genitourinary:  Negative for dysuria, flank pain, frequency, hematuria and urgency.  Musculoskeletal:  Negative for back pain, joint pain and myalgias.  Skin:  Negative for rash.  Neurological:  Negative for dizziness, tingling, focal weakness, seizures, weakness and headaches.  Endo/Heme/Allergies:  Does not bruise/bleed easily.  Psychiatric/Behavioral:  Negative for depression and suicidal ideas. The patient does not have insomnia.      No Known Allergies   Past Medical History:  Diagnosis Date   Family history of adverse reaction to anesthesia    adopted   GERD (gastroesophageal reflux disease)      Past Surgical History:  Procedure Laterality Date   BREAST BIOPSY Left 07/05/2020   [place] Bx, Q clip, [facility]   BREAST BIOPSY Left 07/05/2020   [place] Bx, [facility], benign breast tissue with increased stromal fibrosis   CARPAL TUNNEL RELEASE Bilateral    CHOLECYSTECTOMY     GANGLION CYST EXCISION     wrist   PART MASTECTOMY,RADIO FREQUENCY LOCALIZER,AXILLARY SENTINEL NODE BIOPSY Left 10/26/2020   Procedure: PART MASTECTOMY,RADIO FREQUENCY LOCALIZER,AXILLARY SENTINEL NODE BIOPSY;  Surgeon: Herbert Pun,  MD;  Location: [facility] ORS;  Service: General;  Laterality: Left;   PORTACATH PLACEMENT Right 07/21/2020   Procedure: INSERTION PORT-A-CATH;  Surgeon: Herbert Pun, MD;  Location: [facility] ORS;  Service: General;  Laterality: Right;   right  ovary removal      Social History   Socioeconomic History   Marital status: Married    Spouse name: Not on file   Number of children: Not on file   Years of education: Not on file   Highest education level: Not on file  Occupational History   Not on file  Tobacco Use   Smoking status: Former    Types: Cigarettes    Start date: 2012   Smokeless tobacco: Never  Vaping Use   Vaping Use: Never used  Substance and Sexual Activity   Alcohol use: Yes    Comment: occassional    Drug use: Never   Sexual activity: Not on file  Other Topics Concern   Not on file  Social History Narrative   Not on file   Social Determinants of Health   Financial Resource Strain: Not on file  Food Insecurity: Not on file  Transportation Needs: Not on file  Physical Activity: Not on file  Stress: Not on file  Social Connections: Not on file  Intimate Partner Violence: Not on file    Family History  Adopted: Yes  Problem Relation Age of Onset   Breast cancer Neg Hx      Current Outpatient Medications:    Multiple Vitamins-Minerals (EYE-VITE PLUS LUTEIN PO), Take 1 capsule by mouth 2 (two) times daily. AREDS2, Disp: , Rfl:    Multiple Vitamins-Minerals (MULTIVITAMIN WITH MINERALS) tablet, Take 1 tablet by mouth daily., Disp: , Rfl:    omeprazole (PRILOSEC) 40 MG capsule, Take 1 capsule (40 mg total) by mouth daily., Disp: 30 capsule, Rfl: 0   vitamin B-12 (CYANOCOBALAMIN) 1000 MCG tablet, Take 1,000 mcg by mouth daily., Disp: , Rfl:    vitamin C (ASCORBIC ACID) 500 MG tablet, Take 500 mg by mouth daily., Disp: , Rfl:    vitamin E 180 MG (400 UNITS) capsule, Take 400 Units by mouth daily., Disp: , Rfl:    benzonatate (TESSALON) 100 MG capsule, Take 100 mg by mouth daily as needed for cough. (Patient not taking: No sig reported), Disp: , Rfl:    loratadine-pseudoephedrine (CLARITIN-D 24 HOUR) 10-240 MG 24 hr tablet, Take 1 tablet by mouth daily. (Patient not taking: No sig reported), Disp: 30  tablet, Rfl: 0   ondansetron (ZOFRAN-ODT) 4 MG disintegrating tablet, Take 4 mg by mouth daily as needed for nausea/vomiting. (Patient not taking: No sig reported), Disp: , Rfl:    Polyethylene Glycol 400 (BLINK TEARS) 0.25 % SOLN, Apply 1 drop to eye 5 (five) times daily as needed. Every 2 hrs daily (Patient not taking: No sig reported), Disp: , Rfl:    predniSONE (DELTASONE) 10 MG tablet, Take 1 tablet (10 mg total) by mouth daily with breakfast. Start with 5 tablets daily x 3 days , then 4 tablets x 3 day, then 3 tablets x 3 days, then 2 tablets x 3 days and then 1 tablet x 3 days (Patient not taking: No sig reported), Disp: 45 tablet, Rfl: 0   triamcinolone ointment (KENALOG) 0.5 %, Apply 1 application topically 2 (two) times daily. (Patient not taking: Reported on 10/29/2020), Disp: 30 g, Rfl: 0 No current facility-administered medications for this visit.  Facility-Administered Medications Ordered in Other Visits:    heparin  lock flush 100 unit/mL, 500 Units, Intracatheter, Once PRN, Sindy Guadeloupe, MD  Physical exam:  Vitals:   11/12/20 1002  BP: 124/72  Pulse: 90  Resp: 16  Temp: 98.9 F (37.2 C)  SpO2: 99%  Weight: 174 lb 11.2 oz (79.2 kg)   Physical Exam Constitutional:      General: She is not in acute distress. Cardiovascular:     Rate and Rhythm: Normal rate and regular rhythm.     Heart sounds: Normal heart sounds.  Pulmonary:     Effort: Pulmonary effort is normal.     Breath sounds: Normal breath sounds.  Abdominal:     General: Bowel sounds are normal.     Palpations: Abdomen is soft.  Skin:    General: Skin is warm and dry.  Neurological:     Mental Status: She is alert and oriented to person, place, and time.  Breast exam: There is an area of fluid accumulation seen at the site of sentinel lymph node biopsy site of the left breast.  Overall surgical incision is healing well.  CMP Latest Ref Rng & Units 11/12/2020  Glucose 70 - 99 mg/dL 110(H)  BUN 8 - 23 mg/dL  16  Creatinine 0.44 - 1.00 mg/dL 0.64  Sodium 135 - 145 mmol/L 138  Potassium 3.5 - 5.1 mmol/L 3.8  Chloride 98 - 111 mmol/L 104  CO2 22 - 32 mmol/L 26  Calcium 8.9 - 10.3 mg/dL 8.9  Total Protein 6.5 - 8.1 g/dL 6.8  Total Bilirubin 0.3 - 1.2 mg/dL 0.6  Alkaline Phos 38 - 126 U/L 79  AST 15 - 41 U/L 30  ALT 0 - 44 U/L 21   CBC Latest Ref Rng & Units 11/12/2020  WBC 4.0 - 10.5 K/uL 7.5  Hemoglobin 12.0 - 15.0 g/dL 11.1(L)  Hematocrit 36.0 - 46.0 % 35.6(L)  Platelets 150 - 400 K/uL 309    No images are attached to the encounter.  NM Sentinel Node Inj-No Rpt (Breast)  Result Date: 10/26/2020 Sulfur Colloid was injected by the Nuclear Medicine Technologist for sentinel lymph node localization.   MM Breast Surgical Specimen  Result Date: 10/26/2020 CLINICAL DATA:  Specimen radiograph status post left breast lumpectomy. EXAM: SPECIMEN RADIOGRAPH OF THE LEFT BREAST COMPARISON:  Previous exam(s). FINDINGS: Status post excision of the left breast. The radiofrequency tag and biopsy marker clip are present and completely intact within the specimen. IMPRESSION: Specimen radiograph of the left breast. Electronically Signed   By: Ammie Ferrier M.D.   On: 10/26/2020 10:35  MM DIAG BREAST TOMO UNI LEFT  Result Date: 10/20/2020 CLINICAL DATA:  Mammogram following radiofrequency tag localization of the left breast. EXAM: 3D DIAGNOSTIC LEFT MAMMOGRAM POST ULTRASOUND BIOPSY COMPARISON:  Previous exam(s). FINDINGS: 3D Mammographic images were obtained following ultrasound guided radiofrequency tag localization of a mass in the retroareolar left breast. The radiofrequency tag is seen adjacent to the Q shaped biopsy marking clip in the retroareolar left breast. IMPRESSION: Appropriate positioning of the radiofrequency tag in the retroareolar left breast mass, adjacent to the Q shaped biopsy marking clip. Final Assessment: Post Procedure Mammograms for Marker Placement Electronically Signed   By: Ammie Ferrier M.D.   On: 10/20/2020 16:22  ECHOCARDIOGRAM COMPLETE  Result Date: 10/18/2020    ECHOCARDIOGRAM REPORT   Patient Name:   MICHAIAH MAIDEN [facility] Date of Exam: 10/18/2020 Medical Rec #:  [id number]          Height:       14.0  in Accession #:    [id number]         Weight:       173.9 lb Date of Birth:  [birth year]         BSA:          1.822 m Patient Age:    89 years           BP:           112/64 mmHg Patient Gender: F                  HR:           88 bpm. Exam Location:  [facility] Procedure: 2D Echo, Cardiac Doppler, Color Doppler and Strain Analysis Indications:     Chemo Z09  History:         Patient has prior history of Echocardiogram examinations, most                  recent 07/20/2020. GERD.  Sonographer:     Sherrie Sport RDCS (AE) Referring Phys:  [id number] Weston Anna Cressida [place] Diagnosing Phys: Kathlyn Sacramento MD  Sonographer Comments: Global longitudinal strain was attempted. IMPRESSIONS  1. Left ventricular ejection fraction, by estimation, is 60 to 65%. The left ventricle has normal function. The left ventricle has no regional wall motion abnormalities. There is mild left ventricular hypertrophy. Left ventricular diastolic parameters were normal. The average left ventricular global longitudinal strain is -16.8 %. The global longitudinal strain is normal.  2. Right ventricular systolic function is normal. The right ventricular size is normal.  3. The mitral valve is normal in structure. No evidence of mitral valve regurgitation. No evidence of mitral stenosis.  4. The aortic valve is normal in structure. Aortic valve regurgitation is not visualized. No aortic stenosis is present.  5. The inferior vena cava is normal in size with greater than 50% respiratory variability, suggesting right atrial pressure of 3 mmHg. FINDINGS  Left Ventricle: Left ventricular ejection fraction, by estimation, is 60 to 65%. The left ventricle has normal function. The left ventricle has no regional wall motion abnormalities. The average left  ventricular global longitudinal strain is -16.8 %. The global longitudinal strain is normal. The left ventricular internal cavity size was normal in size. There is mild left ventricular hypertrophy. Left ventricular diastolic parameters were normal. Right Ventricle: The right ventricular size is normal. No increase in right ventricular wall thickness. Right ventricular systolic function is normal. Left Atrium: Left atrial size was normal in size. Right Atrium: Right atrial size was normal in size. Pericardium: There is no evidence of pericardial effusion. Mitral Valve: The mitral valve is normal in structure. No evidence of mitral valve regurgitation. No evidence of mitral valve stenosis. Tricuspid Valve: The tricuspid valve is normal in structure. Tricuspid valve regurgitation is trivial. No evidence of tricuspid stenosis. Aortic Valve: The aortic valve is normal in structure. Aortic valve regurgitation is not visualized. No aortic stenosis is present. Aortic valve mean gradient measures 5.0 mmHg. Aortic valve peak gradient measures 8.4 mmHg. Aortic valve area, by VTI measures 2.46 cm. Pulmonic Valve: The pulmonic valve was normal in structure. Pulmonic valve regurgitation is not visualized. No evidence of pulmonic stenosis. Aorta: The aortic root is normal in size and structure. Venous: The inferior vena cava is normal in size with greater than 50% respiratory variability, suggesting right atrial pressure of 3 mmHg. IAS/Shunts: No atrial level shunt detected by color flow Doppler.  LEFT VENTRICLE PLAX 2D LVIDd:  2.45 cm  Diastology LVIDs:         1.78 cm  LV e' medial:    7.18 cm/s LV PW:         1.40 cm  LV E/e' medial:  9.2 LV IVS:        0.91 cm  LV e' lateral:   12.30 cm/s LVOT diam:     2.00 cm  LV E/e' lateral: 5.4 LV SV:         73 LV SV Index:   40       2D Longitudinal Strain LVOT Area:     3.14 cm 2D Strain GLS Avg:     -16.8 %                          3D Volume EF:                         3D  EF:        57 %                         LV EDV:       110 ml                         LV ESV:       48 ml                         LV SV:        62 ml RIGHT VENTRICLE RV Basal diam:  2.54 cm RV S prime:     18.00 cm/s TAPSE (M-mode): 3.7 cm LEFT ATRIUM             Index       RIGHT ATRIUM          Index LA diam:        3.20 cm 1.76 cm/m  RA Area:     8.56 cm LA Vol (A2C):   47.9 ml 26.29 ml/m RA Volume:   14.50 ml 7.96 ml/m LA Vol (A4C):   43.3 ml 23.76 ml/m LA Biplane Vol: 47.5 ml 26.07 ml/m  AORTIC VALVE                    PULMONIC VALVE AV Area (Vmax):    2.30 cm     PV Vmax:        0.89 m/s AV Area (Vmean):   2.23 cm     PV Peak grad:   3.2 mmHg AV Area (VTI):     2.46 cm     RVOT Peak grad: 5 mmHg AV Vmax:           145.00 cm/s AV Vmean:          106.000 cm/s AV VTI:            0.296 m AV Peak Grad:      8.4 mmHg AV Mean Grad:      5.0 mmHg LVOT Vmax:         106.00 cm/s LVOT Vmean:        75.200 cm/s LVOT VTI:          0.232 m LVOT/AV VTI ratio: 0.78  AORTA Ao Root diam: 2.80 cm MITRAL VALVE  TRICUSPID VALVE MV Area (PHT): 3.61 cm    TR Peak grad:   26.8 mmHg MV Decel Time: 210 msec    TR Vmax:        259.00 cm/s MV E velocity: 66.40 cm/s MV A velocity: 63.80 cm/s  SHUNTS MV E/A ratio:  1.04        Systemic VTI:  0.23 m                            Systemic Diam: 2.00 cm Kathlyn Sacramento MD Electronically signed by Kathlyn Sacramento MD Signature Date/Time: 10/18/2020/2:58:20 PM    Final    [place] LT RADIO FREQUENCY TAG LOC US GUIDE  Result Date: 10/20/2020 CLINICAL DATA:  Radiofrequency tag localization of the left breast prior to lumpectomy. EXAM: NEEDLE LOCALIZATION OF THE LEFT BREAST WITH ULTRASOUND GUIDANCE COMPARISON:  Previous exams. FINDINGS: Patient presents for needle localization prior to left breast lumpectomy. I met with the patient and we discussed the procedure of needle localization including benefits and alternatives. We discussed the high likelihood of a successful procedure. We  discussed the risks of the procedure, including infection, bleeding, tissue injury, and further surgery. Informed, written consent was given. The usual time-out protocol was performed immediately prior to the procedure. Using ultrasound guidance, sterile technique, 1% lidocaine the mass in the retroareolar left breast with the Q shaped biopsy marking clip was localized using a lateral approach. The radiofrequency tag was deployed successfully. IMPRESSION: Radar reflector localization of the left breast. No apparent complications. Electronically Signed   By: Ammie Ferrier M.D.   On: 10/20/2020 16:19    Assessment and plan- Patient is a 65 y.o. female with clinical prognostic stage IIa T2 N0 M0 grade 3 invasive mammary carcinoma ER weakly positive, PR negative and HER2 positive.  She received 4 cycles of neoadjuvant TCHP chemotherapy with complete pathological response.  She is here for on treatment assessment prior to cycle 1 of adjuvant Herceptin and Perjeta  Counts okay to proceed with cycle 1 of Herceptin and Perjeta cycle 1 adjuvant today.  If she continues to have significant skin rash with this regimen I will consider dropping perjeta at that time.  Patient will also be starting adjuvant radiation treatment soon.  She is weakly ER positive and I will consider endocrine therapy down the line upon completion of radiation treatment.  I will see her back in 3 weeks for cycle 2 of adjuvant Herceptin and Perjeta.  No labs   Visit Diagnosis 1. Malignant neoplasm of upper-outer quadrant of left breast in female, estrogen receptor positive ([place])   2. Encounter for monoclonal antibody treatment for malignancy      Dr. Randa Evens, MD, MPH [facility] [phone] 11/12/2020 4:23 PM

## 2020-11-16 DIAGNOSIS — Z170 Estrogen receptor positive status [ER+]: Secondary | ICD-10-CM | POA: Diagnosis not present

## 2020-11-16 DIAGNOSIS — Z510 Encounter for antineoplastic radiation therapy: Secondary | ICD-10-CM | POA: Diagnosis not present

## 2020-11-16 DIAGNOSIS — C50412 Malignant neoplasm of upper-outer quadrant of left female breast: Secondary | ICD-10-CM | POA: Diagnosis not present

## 2020-11-18 ENCOUNTER — Ambulatory Visit: Admission: RE | Admit: 2020-11-18 | Payer: BC Managed Care – PPO | Source: Ambulatory Visit

## 2020-11-18 DIAGNOSIS — C50412 Malignant neoplasm of upper-outer quadrant of left female breast: Secondary | ICD-10-CM | POA: Diagnosis not present

## 2020-11-18 DIAGNOSIS — Z170 Estrogen receptor positive status [ER+]: Secondary | ICD-10-CM | POA: Diagnosis not present

## 2020-11-18 DIAGNOSIS — Z510 Encounter for antineoplastic radiation therapy: Secondary | ICD-10-CM | POA: Diagnosis not present

## 2020-11-22 ENCOUNTER — Ambulatory Visit
Admission: RE | Admit: 2020-11-22 | Discharge: 2020-11-22 | Disposition: A | Discharge status: Home / Self Care | Payer: BC Managed Care – PPO | Source: Ambulatory Visit | Attending: Radiation Oncology | Admitting: Radiation Oncology

## 2020-11-22 DIAGNOSIS — Z510 Encounter for antineoplastic radiation therapy: Secondary | ICD-10-CM | POA: Diagnosis not present

## 2020-11-22 DIAGNOSIS — C50412 Malignant neoplasm of upper-outer quadrant of left female breast: Secondary | ICD-10-CM | POA: Diagnosis not present

## 2020-11-22 DIAGNOSIS — Z170 Estrogen receptor positive status [ER+]: Secondary | ICD-10-CM | POA: Diagnosis not present

## 2020-11-23 ENCOUNTER — Ambulatory Visit
Admission: RE | Admit: 2020-11-23 | Discharge: 2020-11-23 | Disposition: A | Discharge status: Home / Self Care | Payer: BC Managed Care – PPO | Source: Ambulatory Visit | Attending: Radiation Oncology | Admitting: Radiation Oncology

## 2020-11-23 DIAGNOSIS — Z510 Encounter for antineoplastic radiation therapy: Secondary | ICD-10-CM | POA: Diagnosis not present

## 2020-11-23 DIAGNOSIS — Z170 Estrogen receptor positive status [ER+]: Secondary | ICD-10-CM | POA: Diagnosis not present

## 2020-11-23 DIAGNOSIS — C50412 Malignant neoplasm of upper-outer quadrant of left female breast: Secondary | ICD-10-CM | POA: Diagnosis not present

## 2020-11-24 ENCOUNTER — Ambulatory Visit
Admission: RE | Admit: 2020-11-24 | Discharge: 2020-11-24 | Disposition: A | Discharge status: Home / Self Care | Payer: BC Managed Care – PPO | Source: Ambulatory Visit | Attending: Radiation Oncology | Admitting: Radiation Oncology

## 2020-11-24 DIAGNOSIS — Z510 Encounter for antineoplastic radiation therapy: Secondary | ICD-10-CM | POA: Diagnosis not present

## 2020-11-24 DIAGNOSIS — C50412 Malignant neoplasm of upper-outer quadrant of left female breast: Secondary | ICD-10-CM | POA: Diagnosis not present

## 2020-11-24 DIAGNOSIS — Z170 Estrogen receptor positive status [ER+]: Secondary | ICD-10-CM | POA: Diagnosis not present

## 2020-11-25 ENCOUNTER — Ambulatory Visit
Admission: RE | Admit: 2020-11-25 | Discharge: 2020-11-25 | Disposition: A | Discharge status: Home / Self Care | Payer: BC Managed Care – PPO | Source: Ambulatory Visit | Attending: Radiation Oncology | Admitting: Radiation Oncology

## 2020-11-25 DIAGNOSIS — C50412 Malignant neoplasm of upper-outer quadrant of left female breast: Secondary | ICD-10-CM | POA: Diagnosis not present

## 2020-11-25 DIAGNOSIS — Z170 Estrogen receptor positive status [ER+]: Secondary | ICD-10-CM | POA: Diagnosis not present

## 2020-11-25 DIAGNOSIS — Z510 Encounter for antineoplastic radiation therapy: Secondary | ICD-10-CM | POA: Diagnosis not present

## 2020-11-26 ENCOUNTER — Ambulatory Visit
Admission: RE | Admit: 2020-11-26 | Discharge: 2020-11-26 | Disposition: A | Discharge status: Home / Self Care | Payer: BC Managed Care – PPO | Source: Ambulatory Visit | Attending: Radiation Oncology | Admitting: Radiation Oncology

## 2020-11-26 DIAGNOSIS — C50412 Malignant neoplasm of upper-outer quadrant of left female breast: Secondary | ICD-10-CM | POA: Diagnosis not present

## 2020-11-26 DIAGNOSIS — Z510 Encounter for antineoplastic radiation therapy: Secondary | ICD-10-CM | POA: Diagnosis not present

## 2020-11-26 DIAGNOSIS — Z170 Estrogen receptor positive status [ER+]: Secondary | ICD-10-CM | POA: Diagnosis not present

## 2020-11-27 ENCOUNTER — Encounter: Payer: Self-pay | Admitting: Oncology

## 2020-11-29 ENCOUNTER — Ambulatory Visit
Admission: RE | Admit: 2020-11-29 | Discharge: 2020-11-29 | Disposition: A | Discharge status: Home / Self Care | Payer: BC Managed Care – PPO | Source: Ambulatory Visit | Attending: Radiation Oncology | Admitting: Radiation Oncology

## 2020-11-29 DIAGNOSIS — C50412 Malignant neoplasm of upper-outer quadrant of left female breast: Secondary | ICD-10-CM | POA: Diagnosis not present

## 2020-11-29 DIAGNOSIS — Z510 Encounter for antineoplastic radiation therapy: Secondary | ICD-10-CM | POA: Diagnosis not present

## 2020-11-29 DIAGNOSIS — Z170 Estrogen receptor positive status [ER+]: Secondary | ICD-10-CM | POA: Diagnosis not present

## 2020-11-30 ENCOUNTER — Ambulatory Visit
Admission: RE | Admit: 2020-11-30 | Discharge: 2020-11-30 | Disposition: A | Discharge status: Home / Self Care | Payer: BC Managed Care – PPO | Source: Ambulatory Visit | Attending: Radiation Oncology | Admitting: Radiation Oncology

## 2020-11-30 DIAGNOSIS — Z170 Estrogen receptor positive status [ER+]: Secondary | ICD-10-CM | POA: Diagnosis not present

## 2020-11-30 DIAGNOSIS — C50412 Malignant neoplasm of upper-outer quadrant of left female breast: Secondary | ICD-10-CM | POA: Diagnosis not present

## 2020-11-30 DIAGNOSIS — Z510 Encounter for antineoplastic radiation therapy: Secondary | ICD-10-CM | POA: Diagnosis not present

## 2020-12-01 ENCOUNTER — Ambulatory Visit
Admission: RE | Admit: 2020-12-01 | Discharge: 2020-12-01 | Disposition: A | Discharge status: Home / Self Care | Payer: BC Managed Care – PPO | Source: Ambulatory Visit | Attending: Radiation Oncology | Admitting: Radiation Oncology

## 2020-12-01 DIAGNOSIS — Z170 Estrogen receptor positive status [ER+]: Secondary | ICD-10-CM | POA: Diagnosis not present

## 2020-12-01 DIAGNOSIS — Z510 Encounter for antineoplastic radiation therapy: Secondary | ICD-10-CM | POA: Diagnosis not present

## 2020-12-01 DIAGNOSIS — C50412 Malignant neoplasm of upper-outer quadrant of left female breast: Secondary | ICD-10-CM | POA: Diagnosis not present

## 2020-12-02 ENCOUNTER — Ambulatory Visit
Admission: RE | Admit: 2020-12-02 | Discharge: 2020-12-02 | Disposition: A | Discharge status: Home / Self Care | Payer: BC Managed Care – PPO | Source: Ambulatory Visit | Attending: Radiation Oncology | Admitting: Radiation Oncology

## 2020-12-02 DIAGNOSIS — Z510 Encounter for antineoplastic radiation therapy: Secondary | ICD-10-CM | POA: Diagnosis not present

## 2020-12-02 DIAGNOSIS — C50412 Malignant neoplasm of upper-outer quadrant of left female breast: Secondary | ICD-10-CM | POA: Diagnosis not present

## 2020-12-02 DIAGNOSIS — Z170 Estrogen receptor positive status [ER+]: Secondary | ICD-10-CM | POA: Diagnosis not present

## 2020-12-03 ENCOUNTER — Ambulatory Visit
Admission: RE | Admit: 2020-12-03 | Discharge: 2020-12-03 | Disposition: A | Discharge status: Home / Self Care | Payer: BC Managed Care – PPO | Source: Ambulatory Visit | Attending: Radiation Oncology | Admitting: Radiation Oncology

## 2020-12-03 ENCOUNTER — Inpatient Hospital Stay: Payer: BC Managed Care – PPO

## 2020-12-03 ENCOUNTER — Inpatient Hospital Stay (HOSPITAL_BASED_OUTPATIENT_CLINIC_OR_DEPARTMENT_OTHER): Payer: BC Managed Care – PPO | Admitting: Oncology

## 2020-12-03 ENCOUNTER — Encounter: Payer: Self-pay | Admitting: Oncology

## 2020-12-03 VITALS — BP 119/71 | HR 74 | Temp 97.6°F | Resp 16 | Wt 175.4 lb

## 2020-12-03 DIAGNOSIS — Z170 Estrogen receptor positive status [ER+]: Secondary | ICD-10-CM

## 2020-12-03 DIAGNOSIS — Z510 Encounter for antineoplastic radiation therapy: Secondary | ICD-10-CM | POA: Diagnosis not present

## 2020-12-03 DIAGNOSIS — G620 Drug-induced polyneuropathy: Secondary | ICD-10-CM

## 2020-12-03 DIAGNOSIS — C50412 Malignant neoplasm of upper-outer quadrant of left female breast: Secondary | ICD-10-CM

## 2020-12-03 DIAGNOSIS — Z79899 Other long term (current) drug therapy: Secondary | ICD-10-CM | POA: Diagnosis not present

## 2020-12-03 DIAGNOSIS — Z5112 Encounter for antineoplastic immunotherapy: Secondary | ICD-10-CM | POA: Diagnosis not present

## 2020-12-03 DIAGNOSIS — T451X5A Adverse effect of antineoplastic and immunosuppressive drugs, initial encounter: Secondary | ICD-10-CM

## 2020-12-03 LAB — COMPREHENSIVE METABOLIC PANEL
ALT: 22 U/L (ref 0–44)
AST: 23 U/L (ref 15–41)
Albumin: 3.8 g/dL (ref 3.5–5.0)
Alkaline Phosphatase: 95 U/L (ref 38–126)
Anion gap: 6 (ref 5–15)
BUN: 17 mg/dL (ref 8–23)
CO2: 25 mmol/L (ref 22–32)
Calcium: 9 mg/dL (ref 8.9–10.3)
Chloride: 105 mmol/L (ref 98–111)
Creatinine, Ser: 0.89 mg/dL (ref 0.44–1.00)
GFR, Estimated: >60 mL/min (ref >60)
Glucose, Bld: 102 mg/dL — ABNORMAL HIGH (ref 70–99)
Potassium: 4 mmol/L (ref 3.5–5.1)
Sodium: 136 mmol/L (ref 135–145)
Total Bilirubin: 0.7 mg/dL (ref 0.3–1.2)
Total Protein: 7 g/dL (ref 6.5–8.1)

## 2020-12-03 LAB — CBC WITH DIFFERENTIAL/PLATELET
Abs Immature Granulocytes: 0.03 10*3/uL (ref 0.00–0.07)
Basophils Absolute: 0.1 10*3/uL (ref 0.0–0.1)
Basophils Relative: 1 %
Eosinophils Absolute: 0.9 10*3/uL — ABNORMAL HIGH (ref 0.0–0.5)
Eosinophils Relative: 11 %
HCT: 35.6 % — ABNORMAL LOW (ref 36.0–46.0)
Hemoglobin: 11.4 g/dL — ABNORMAL LOW (ref 12.0–15.0)
Immature Granulocytes: 0 %
Lymphocytes Relative: 24 %
Lymphs Abs: 1.8 10*3/uL (ref 0.7–4.0)
MCH: 21.9 pg — ABNORMAL LOW (ref 26.0–34.0)
MCHC: 32 g/dL (ref 30.0–36.0)
MCV: 68.5 fL — ABNORMAL LOW (ref 80.0–100.0)
Monocytes Absolute: 0.9 10*3/uL (ref 0.1–1.0)
Monocytes Relative: 12 %
Neutro Abs: 4 10*3/uL (ref 1.7–7.7)
Neutrophils Relative %: 52 %
Platelets: 307 10*3/uL (ref 150–400)
RBC: 5.2 MIL/uL — ABNORMAL HIGH (ref 3.87–5.11)
RDW: 13.3 % (ref 11.5–15.5)
WBC: 7.7 10*3/uL (ref 4.0–10.5)
nRBC: 0 % (ref 0.0–0.2)

## 2020-12-03 MED ORDER — TRASTUZUMAB-DKST CHEMO 150 MG IV SOLR
450.0000 mg | Freq: Once | INTRAVENOUS | Status: AC
  Administered 2020-12-03: 450 mg via INTRAVENOUS
  Filled 2020-12-03: qty 21.43

## 2020-12-03 MED ORDER — DIPHENHYDRAMINE HCL 25 MG PO CAPS
50.0000 mg | ORAL_CAPSULE | Freq: Once | ORAL | Status: AC
  Administered 2020-12-03: 50 mg via ORAL
  Filled 2020-12-03: qty 2

## 2020-12-03 MED ORDER — ACETAMINOPHEN 325 MG PO TABS
650.0000 mg | ORAL_TABLET | Freq: Once | ORAL | Status: AC
  Administered 2020-12-03: 650 mg via ORAL
  Filled 2020-12-03: qty 2

## 2020-12-03 MED ORDER — SODIUM CHLORIDE 0.9% FLUSH
10.0000 mL | INTRAVENOUS | Status: DC | PRN
  Filled 2020-12-03: qty 10

## 2020-12-03 MED ORDER — SODIUM CHLORIDE 0.9 % IV SOLN
Freq: Once | INTRAVENOUS | Status: AC
  Filled 2020-12-03: qty 250

## 2020-12-03 MED ORDER — SODIUM CHLORIDE 0.9 % IV SOLN
420.0000 mg | Freq: Once | INTRAVENOUS | Status: AC
  Administered 2020-12-03: 420 mg via INTRAVENOUS
  Filled 2020-12-03: qty 14

## 2020-12-03 MED ORDER — HEPARIN SOD (PORK) LOCK FLUSH 100 UNIT/ML IV SOLN
500.0000 [IU] | Freq: Once | INTRAVENOUS | Status: AC | PRN
  Administered 2020-12-03: 500 [IU]
  Filled 2020-12-03: qty 5

## 2020-12-03 NOTE — Progress Notes (Signed)
Hematology/Oncology Consult note [facility]  Telephone:([phone] [phone]  Patient Care Team: Kirk Ruths, MD as PCP - General (Internal Medicine) Rico Junker, RN as Oncology Nurse Navigator   Name of the patient: Wendy Caldwell  [id number]  1955-05-23   Date of visit: 12/03/20  Diagnosis- left breast cancer clinical prognostic stage IIA cT2 N0 M0 ER weakly positive, PR negative HER2 positive  Chief complaint/ Reason for visit-on treatment assessment prior to cycle 2 of adjuvant Herceptin and Perjeta  Heme/Onc history: Patient is a 65 year old female with a past medical history significant for psoriasis for which she is on Humira.  She self palpated a left breast mass which prompted a bilateral diagnostic mammogram on 06/25/2020.  That showed an irregular hypoechoic mass 2.5 x 1.7 x 1.8 cm at 1 o'clock position 1 cm from the nipple.  3 cm from the nipple were benign cysts.  1 mildly enlarged left axillary lymph node with cortical thickening 4 mm.  Both the breast mass and the lymph node were biopsied.  Left breast biopsy was consistent with invasive mammary carcinoma grade 3 ER weakly +1 to 10%, PR negative and HER2 positive by IHC.   Patient completed 4 cycles of neoadjuvant TCHP chemotherapy which was complicated by worsening skin rash and fatigue.  Patient did not wish to proceed with 2 further cycles of chemotherapy and proceeded with lumpectomy and sentinel lymph node biopsy.  Final pathology showed complete pathological response on 10/26/2020.  2 sentinel lymph nodes negative for malignancy.   Plan is to proceed with adjuvant Herceptin and Perjeta for 1 year as well as adjuvant radiation treatment and discussion for endocrine therapy down the line.    Interval history-patient has started adjuvant radiation treatment overall tolerating well.  She does have some baseline tingling numbness in her fingertips and toes which started  right after she completed chemotherapy.  She does report discomfort and fullness in her left axilla and she will be seeing Dr. Peyton Najjar soon to see if she needs another drainage of her seroma.  She does not report any significant diarrhea or skin rash with the start of Herceptin and Perjeta.  ECOG PS- 1 Pain scale- 0   Review of systems- Review of Systems  Constitutional:  Positive for malaise/fatigue. Negative for chills, fever and weight loss.  HENT:  Negative for congestion, ear discharge and nosebleeds.   Eyes:  Negative for blurred vision.  Respiratory:  Negative for cough, hemoptysis, sputum production, shortness of breath and wheezing.   Cardiovascular:  Negative for chest pain, palpitations, orthopnea and claudication.  Gastrointestinal:  Negative for abdominal pain, blood in stool, constipation, diarrhea, heartburn, melena, nausea and vomiting.  Genitourinary:  Negative for dysuria, flank pain, frequency, hematuria and urgency.  Musculoskeletal:  Negative for back pain, joint pain and myalgias.  Skin:  Negative for rash.  Neurological:  Positive for sensory change (Peripheral neuropathy). Negative for dizziness, tingling, focal weakness, seizures, weakness and headaches.  Endo/Heme/Allergies:  Does not bruise/bleed easily.  Psychiatric/Behavioral:  Negative for depression and suicidal ideas. The patient does not have insomnia.      No Known Allergies   Past Medical History:  Diagnosis Date   Family history of adverse reaction to anesthesia    adopted   GERD (gastroesophageal reflux disease)      Past Surgical History:  Procedure Laterality Date   BREAST BIOPSY Left 07/05/2020   [place] Bx, Q clip, [facility]   BREAST BIOPSY Left 07/05/2020  [place] Bx, [facility], benign breast tissue with increased stromal fibrosis   CARPAL TUNNEL RELEASE Bilateral    CHOLECYSTECTOMY     GANGLION CYST EXCISION     wrist   PART MASTECTOMY,RADIO FREQUENCY LOCALIZER,AXILLARY SENTINEL NODE BIOPSY Left  10/26/2020   Procedure: PART MASTECTOMY,RADIO FREQUENCY LOCALIZER,AXILLARY SENTINEL NODE BIOPSY;  Surgeon: Herbert Pun, MD;  Location: [facility] ORS;  Service: General;  Laterality: Left;   PORTACATH PLACEMENT Right 07/21/2020   Procedure: INSERTION PORT-A-CATH;  Surgeon: Herbert Pun, MD;  Location: [facility] ORS;  Service: General;  Laterality: Right;   right ovary removal      Social History   Socioeconomic History   Marital status: Married    Spouse name: Not on file   Number of children: Not on file   Years of education: Not on file   Highest education level: Not on file  Occupational History   Not on file  Tobacco Use   Smoking status: Former    Types: Cigarettes    Start date: 2012   Smokeless tobacco: Never  Vaping Use   Vaping Use: Never used  Substance and Sexual Activity   Alcohol use: Yes    Comment: occassional    Drug use: Never   Sexual activity: Not on file  Other Topics Concern   Not on file  Social History Narrative   Not on file   Social Determinants of Health   Financial Resource Strain: Not on file  Food Insecurity: Not on file  Transportation Needs: Not on file  Physical Activity: Not on file  Stress: Not on file  Social Connections: Not on file  Intimate Partner Violence: Not on file    Family History  Adopted: Yes  Problem Relation Age of Onset   Breast cancer Neg Hx      Current Outpatient Medications:    Multiple Vitamins-Minerals (EYE-VITE PLUS LUTEIN PO), Take 1 capsule by mouth 2 (two) times daily. AREDS2, Disp: , Rfl:    Multiple Vitamins-Minerals (MULTIVITAMIN WITH MINERALS) tablet, Take 1 tablet by mouth daily., Disp: , Rfl:    omeprazole (PRILOSEC) 40 MG capsule, Take 1 capsule (40 mg total) by mouth daily., Disp: 30 capsule, Rfl: 0   Polyethylene Glycol 400 (BLINK TEARS) 0.25 % SOLN, Apply 1 drop to eye 5 (five) times daily as needed. Every 2 hrs daily, Disp: , Rfl:    vitamin B-12 (CYANOCOBALAMIN) 1000 MCG tablet,  Take 1,000 mcg by mouth daily., Disp: , Rfl:    vitamin C (ASCORBIC ACID) 500 MG tablet, Take 500 mg by mouth daily., Disp: , Rfl:    vitamin E 180 MG (400 UNITS) capsule, Take 400 Units by mouth daily., Disp: , Rfl:    benzonatate (TESSALON) 100 MG capsule, Take 100 mg by mouth daily as needed for cough. (Patient not taking: No sig reported), Disp: , Rfl:    loratadine-pseudoephedrine (CLARITIN-D 24 HOUR) 10-240 MG 24 hr tablet, Take 1 tablet by mouth daily. (Patient not taking: No sig reported), Disp: 30 tablet, Rfl: 0   ondansetron (ZOFRAN-ODT) 4 MG disintegrating tablet, Take 4 mg by mouth daily as needed for nausea/vomiting. (Patient not taking: No sig reported), Disp: , Rfl:    predniSONE (DELTASONE) 10 MG tablet, Take 1 tablet (10 mg total) by mouth daily with breakfast. Start with 5 tablets daily x 3 days , then 4 tablets x 3 day, then 3 tablets x 3 days, then 2 tablets x 3 days and then 1 tablet x 3 days (Patient not taking: No  sig reported), Disp: 45 tablet, Rfl: 0   triamcinolone ointment (KENALOG) 0.5 %, Apply 1 application topically 2 (two) times daily. (Patient not taking: No sig reported), Disp: 30 g, Rfl: 0 No current facility-administered medications for this visit.  Facility-Administered Medications Ordered in Other Visits:    sodium chloride flush (NS) 0.9 % injection 10 mL, 10 mL, Intracatheter, PRN, Sindy Guadeloupe, MD  Physical exam:  Vitals:   12/03/20 0840  BP: 119/71  Pulse: 74  Resp: 16  Temp: 97.6 F (36.4 C)  SpO2: 99%  Weight: 175 lb 6.4 oz (79.6 kg)   Physical Exam Constitutional:      General: She is not in acute distress. Cardiovascular:     Rate and Rhythm: Normal rate and regular rhythm.     Heart sounds: Normal heart sounds.  Pulmonary:     Effort: Pulmonary effort is normal.  Lymphadenopathy:     Comments: There is induration and fullness noted in the left axilla likely from reaccumulation of seroma.  Skin:    General: Skin is warm and dry.   Neurological:     Mental Status: She is alert and oriented to person, place, and time.     CMP Latest Ref Rng & Units 12/03/2020  Glucose 70 - 99 mg/dL 102(H)  BUN 8 - 23 mg/dL 17  Creatinine 0.44 - 1.00 mg/dL 0.89  Sodium 135 - 145 mmol/L 136  Potassium 3.5 - 5.1 mmol/L 4.0  Chloride 98 - 111 mmol/L 105  CO2 22 - 32 mmol/L 25  Calcium 8.9 - 10.3 mg/dL 9.0  Total Protein 6.5 - 8.1 g/dL 7.0  Total Bilirubin 0.3 - 1.2 mg/dL 0.7  Alkaline Phos 38 - 126 U/L 95  AST 15 - 41 U/L 23  ALT 0 - 44 U/L 22   CBC Latest Ref Rng & Units 12/03/2020  WBC 4.0 - 10.5 K/uL 7.7  Hemoglobin 12.0 - 15.0 g/dL 11.4(L)  Hematocrit 36.0 - 46.0 % 35.6(L)  Platelets 150 - 400 K/uL 307    Assessment and plan- Patient is a 65 y.o. female with clinical prognostic stage IIa T2 N0 M0 grade 3 invasive mammary carcinoma ER weakly positive, PR negative and HER2 positive.  She received 4 cycles of neoadjuvant TCHP chemotherapy with complete pathological response.  She is here for on treatment assessment prior to cycle 2 of adjuvant Herceptin and Perjeta  Counts okay to proceed with cycle 2 of adjuvant Herceptin and Perjeta today.  She will directly proceed for cycle 3 in 3 weeks and I will see her back in 6 weeks for cycle 4 with labs.  Her last echocardiogram was done in August 2022 which was normal and she would need a repeat one in November 2022.  She is currently undergoing adjuvant radiation treatment.  When I see her back in 6 weeks I will discuss hormone therapy with her.  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  I will refer her to acupuncture at this time and hold off on any additional medications   Visit Diagnosis 1. Encounter for monoclonal antibody treatment for malignancy   2. Malignant neoplasm of upper-outer quadrant of left breast in female, estrogen receptor positive ([place])   3. Chemotherapy-induced peripheral neuropathy ([facility])      Dr. Randa Evens,  MD, MPH [facility] [phone] 12/03/2020 1:21 PM

## 2020-12-03 NOTE — Progress Notes (Signed)
Pt states that her neuropathy is starting to become pretty constant here lately. As well as, ever since chemo she is not able to control her allergies with any allergy medication she is currently taking and it is becoming an issue.

## 2020-12-03 NOTE — Patient Instructions (Signed)
[facility]  Discharge Instructions: Thank you for choosing [facility] to provide your oncology and hematology care.  If you have a lab appointment with the [facility], please go directly to the [facility] and check in at the registration area.  Wear comfortable clothing and clothing appropriate for easy access to any Portacath or PICC line.   We strive to give you quality time with your provider. You may need to reschedule your appointment if you arrive late (15 or more minutes).  Arriving late affects you and other patients whose appointments are after yours.  Also, if you miss three or more appointments without notifying the office, you may be dismissed from the clinic at the provider's discretion.      For prescription refill requests, have your pharmacy contact our office and allow 72 hours for refills to be completed.    Today you received the following chemotherapy and/or immunotherapy agents : Trastuzumab, Pertuzumab      To help prevent nausea and vomiting after your treatment, we encourage you to take your nausea medication as directed.  BELOW ARE SYMPTOMS THAT SHOULD BE REPORTED IMMEDIATELY: *FEVER GREATER THAN 100.4 F (38 C) OR HIGHER *CHILLS OR SWEATING *NAUSEA AND VOMITING THAT IS NOT CONTROLLED WITH YOUR NAUSEA MEDICATION *UNUSUAL SHORTNESS OF BREATH *UNUSUAL BRUISING OR BLEEDING *URINARY PROBLEMS (pain or burning when urinating, or frequent urination) *BOWEL PROBLEMS (unusual diarrhea, constipation, pain near the anus) TENDERNESS IN MOUTH AND THROAT WITH OR WITHOUT PRESENCE OF ULCERS (sore throat, sores in mouth, or a toothache) UNUSUAL RASH, SWELLING OR PAIN  UNUSUAL VAGINAL DISCHARGE OR ITCHING   Items with * indicate a potential emergency and should be followed up as soon as possible or go to the Emergency Department if any problems should occur.  Please show the CHEMOTHERAPY ALERT CARD or IMMUNOTHERAPY ALERT  CARD at check-in to the Emergency Department and triage nurse.  Should you have questions after your visit or need to cancel or reschedule your appointment, please contact [facility]  [phone] and follow the prompts.  Office hours are 8:00 a.m. to 4:30 p.m. Monday - Friday. Please note that voicemails left after 4:00 p.m. may not be returned until the following business day.  We are closed weekends and major holidays. You have access to a nurse at all times for urgent questions. Please call the main number to the clinic [phone] and follow the prompts.  For any non-urgent questions, you may also contact your provider using MyChart. We now offer e-Visits for anyone 52 and older to request care online for non-urgent symptoms. For details visit [email or web address].   Also download the MyChart app! Go to the app store, search "MyChart", open the app, select [facility], and log in with your MyChart username and password.  Due to Covid, a mask is required upon entering the hospital/clinic. If you do not have a mask, one will be given to you upon arrival. For doctor visits, patients may have 1 support person aged 68 or older with them. For treatment visits, patients cannot have anyone with them due to current Covid guidelines and our immunocompromised population.

## 2020-12-06 ENCOUNTER — Ambulatory Visit
Admission: RE | Admit: 2020-12-06 | Discharge: 2020-12-06 | Disposition: A | Discharge status: Home / Self Care | Payer: BC Managed Care – PPO | Source: Ambulatory Visit | Attending: Radiation Oncology | Admitting: Radiation Oncology

## 2020-12-06 DIAGNOSIS — Z170 Estrogen receptor positive status [ER+]: Secondary | ICD-10-CM | POA: Diagnosis not present

## 2020-12-06 DIAGNOSIS — C50412 Malignant neoplasm of upper-outer quadrant of left female breast: Secondary | ICD-10-CM | POA: Diagnosis not present

## 2020-12-06 DIAGNOSIS — Z510 Encounter for antineoplastic radiation therapy: Secondary | ICD-10-CM | POA: Diagnosis not present

## 2020-12-07 ENCOUNTER — Ambulatory Visit
Admission: RE | Admit: 2020-12-07 | Discharge: 2020-12-07 | Disposition: A | Discharge status: Home / Self Care | Payer: BC Managed Care – PPO | Source: Ambulatory Visit | Attending: Radiation Oncology | Admitting: Radiation Oncology

## 2020-12-07 DIAGNOSIS — Z510 Encounter for antineoplastic radiation therapy: Secondary | ICD-10-CM | POA: Diagnosis not present

## 2020-12-07 DIAGNOSIS — Z170 Estrogen receptor positive status [ER+]: Secondary | ICD-10-CM | POA: Diagnosis not present

## 2020-12-07 DIAGNOSIS — C50412 Malignant neoplasm of upper-outer quadrant of left female breast: Secondary | ICD-10-CM | POA: Diagnosis not present

## 2020-12-08 ENCOUNTER — Ambulatory Visit
Admission: RE | Admit: 2020-12-08 | Discharge: 2020-12-08 | Disposition: A | Discharge status: Home / Self Care | Payer: BC Managed Care – PPO | Source: Ambulatory Visit | Attending: Radiation Oncology | Admitting: Radiation Oncology

## 2020-12-08 DIAGNOSIS — C50412 Malignant neoplasm of upper-outer quadrant of left female breast: Secondary | ICD-10-CM | POA: Diagnosis not present

## 2020-12-08 DIAGNOSIS — Z510 Encounter for antineoplastic radiation therapy: Secondary | ICD-10-CM | POA: Diagnosis not present

## 2020-12-08 DIAGNOSIS — Z170 Estrogen receptor positive status [ER+]: Secondary | ICD-10-CM | POA: Diagnosis not present

## 2020-12-09 ENCOUNTER — Ambulatory Visit
Admission: RE | Admit: 2020-12-09 | Discharge: 2020-12-09 | Disposition: A | Discharge status: Home / Self Care | Payer: BC Managed Care – PPO | Source: Ambulatory Visit | Attending: Radiation Oncology | Admitting: Radiation Oncology

## 2020-12-09 DIAGNOSIS — C50412 Malignant neoplasm of upper-outer quadrant of left female breast: Secondary | ICD-10-CM | POA: Diagnosis not present

## 2020-12-09 DIAGNOSIS — Z170 Estrogen receptor positive status [ER+]: Secondary | ICD-10-CM | POA: Diagnosis not present

## 2020-12-09 DIAGNOSIS — Z510 Encounter for antineoplastic radiation therapy: Secondary | ICD-10-CM | POA: Diagnosis not present

## 2020-12-10 ENCOUNTER — Ambulatory Visit
Admission: RE | Admit: 2020-12-10 | Discharge: 2020-12-10 | Disposition: A | Discharge status: Home / Self Care | Payer: BC Managed Care – PPO | Source: Ambulatory Visit | Attending: Radiation Oncology | Admitting: Radiation Oncology

## 2020-12-10 DIAGNOSIS — C50412 Malignant neoplasm of upper-outer quadrant of left female breast: Secondary | ICD-10-CM | POA: Diagnosis not present

## 2020-12-10 DIAGNOSIS — Z510 Encounter for antineoplastic radiation therapy: Secondary | ICD-10-CM | POA: Diagnosis not present

## 2020-12-10 DIAGNOSIS — Z170 Estrogen receptor positive status [ER+]: Secondary | ICD-10-CM | POA: Diagnosis not present

## 2020-12-13 ENCOUNTER — Ambulatory Visit
Admission: RE | Admit: 2020-12-13 | Discharge: 2020-12-13 | Disposition: A | Discharge status: Home / Self Care | Payer: BC Managed Care – PPO | Source: Ambulatory Visit | Attending: Radiation Oncology | Admitting: Radiation Oncology

## 2020-12-13 DIAGNOSIS — Z170 Estrogen receptor positive status [ER+]: Secondary | ICD-10-CM | POA: Insufficient documentation

## 2020-12-13 DIAGNOSIS — Z510 Encounter for antineoplastic radiation therapy: Secondary | ICD-10-CM | POA: Diagnosis not present

## 2020-12-13 DIAGNOSIS — C50412 Malignant neoplasm of upper-outer quadrant of left female breast: Secondary | ICD-10-CM | POA: Diagnosis not present

## 2020-12-14 ENCOUNTER — Ambulatory Visit
Admission: RE | Admit: 2020-12-14 | Discharge: 2020-12-14 | Disposition: A | Discharge status: Home / Self Care | Payer: BC Managed Care – PPO | Source: Ambulatory Visit | Attending: Radiation Oncology | Admitting: Radiation Oncology

## 2020-12-14 DIAGNOSIS — C50412 Malignant neoplasm of upper-outer quadrant of left female breast: Secondary | ICD-10-CM | POA: Diagnosis not present

## 2020-12-14 DIAGNOSIS — Z170 Estrogen receptor positive status [ER+]: Secondary | ICD-10-CM | POA: Diagnosis not present

## 2020-12-14 DIAGNOSIS — Z510 Encounter for antineoplastic radiation therapy: Secondary | ICD-10-CM | POA: Diagnosis not present

## 2020-12-15 ENCOUNTER — Ambulatory Visit
Admission: RE | Admit: 2020-12-15 | Discharge: 2020-12-15 | Disposition: A | Discharge status: Home / Self Care | Payer: BC Managed Care – PPO | Source: Ambulatory Visit | Attending: Radiation Oncology | Admitting: Radiation Oncology

## 2020-12-15 DIAGNOSIS — Z510 Encounter for antineoplastic radiation therapy: Secondary | ICD-10-CM | POA: Diagnosis not present

## 2020-12-15 DIAGNOSIS — Z170 Estrogen receptor positive status [ER+]: Secondary | ICD-10-CM | POA: Diagnosis not present

## 2020-12-15 DIAGNOSIS — C50412 Malignant neoplasm of upper-outer quadrant of left female breast: Secondary | ICD-10-CM | POA: Diagnosis not present

## 2020-12-16 ENCOUNTER — Ambulatory Visit
Admission: RE | Admit: 2020-12-16 | Discharge: 2020-12-16 | Disposition: A | Discharge status: Home / Self Care | Payer: BC Managed Care – PPO | Source: Ambulatory Visit | Attending: Radiation Oncology | Admitting: Radiation Oncology

## 2020-12-16 DIAGNOSIS — C50412 Malignant neoplasm of upper-outer quadrant of left female breast: Secondary | ICD-10-CM | POA: Diagnosis not present

## 2020-12-16 DIAGNOSIS — Z170 Estrogen receptor positive status [ER+]: Secondary | ICD-10-CM | POA: Diagnosis not present

## 2020-12-16 DIAGNOSIS — Z510 Encounter for antineoplastic radiation therapy: Secondary | ICD-10-CM | POA: Diagnosis not present

## 2020-12-17 ENCOUNTER — Ambulatory Visit
Admission: RE | Admit: 2020-12-17 | Discharge: 2020-12-17 | Disposition: A | Discharge status: Home / Self Care | Payer: BC Managed Care – PPO | Source: Ambulatory Visit | Attending: Radiation Oncology | Admitting: Radiation Oncology

## 2020-12-17 DIAGNOSIS — C50412 Malignant neoplasm of upper-outer quadrant of left female breast: Secondary | ICD-10-CM | POA: Diagnosis not present

## 2020-12-17 DIAGNOSIS — Z510 Encounter for antineoplastic radiation therapy: Secondary | ICD-10-CM | POA: Diagnosis not present

## 2020-12-17 DIAGNOSIS — Z170 Estrogen receptor positive status [ER+]: Secondary | ICD-10-CM | POA: Diagnosis not present

## 2020-12-20 ENCOUNTER — Ambulatory Visit
Admission: RE | Admit: 2020-12-20 | Discharge: 2020-12-20 | Disposition: A | Discharge status: Home / Self Care | Payer: BC Managed Care – PPO | Source: Ambulatory Visit | Attending: Radiation Oncology | Admitting: Radiation Oncology

## 2020-12-20 DIAGNOSIS — C50412 Malignant neoplasm of upper-outer quadrant of left female breast: Secondary | ICD-10-CM | POA: Diagnosis not present

## 2020-12-20 DIAGNOSIS — Z170 Estrogen receptor positive status [ER+]: Secondary | ICD-10-CM | POA: Diagnosis not present

## 2020-12-20 DIAGNOSIS — Z510 Encounter for antineoplastic radiation therapy: Secondary | ICD-10-CM | POA: Diagnosis not present

## 2020-12-24 ENCOUNTER — Inpatient Hospital Stay: Payer: BC Managed Care – PPO | Attending: Oncology

## 2020-12-24 ENCOUNTER — Other Ambulatory Visit: Payer: Self-pay

## 2020-12-24 VITALS — BP 114/77 | HR 75 | Temp 96.3°F | Resp 16

## 2020-12-24 DIAGNOSIS — Z170 Estrogen receptor positive status [ER+]: Secondary | ICD-10-CM | POA: Insufficient documentation

## 2020-12-24 DIAGNOSIS — Z5112 Encounter for antineoplastic immunotherapy: Secondary | ICD-10-CM | POA: Diagnosis not present

## 2020-12-24 DIAGNOSIS — C50412 Malignant neoplasm of upper-outer quadrant of left female breast: Secondary | ICD-10-CM | POA: Diagnosis not present

## 2020-12-24 DIAGNOSIS — Z79899 Other long term (current) drug therapy: Secondary | ICD-10-CM | POA: Insufficient documentation

## 2020-12-24 MED ORDER — SODIUM CHLORIDE 0.9 % IV SOLN
420.0000 mg | Freq: Once | INTRAVENOUS | Status: AC
  Administered 2020-12-24: 420 mg via INTRAVENOUS
  Filled 2020-12-24: qty 14

## 2020-12-24 MED ORDER — TRASTUZUMAB-DKST CHEMO 150 MG IV SOLR
450.0000 mg | Freq: Once | INTRAVENOUS | Status: AC
  Administered 2020-12-24: 450 mg via INTRAVENOUS
  Filled 2020-12-24: qty 21.43

## 2020-12-24 MED ORDER — ACETAMINOPHEN 325 MG PO TABS
650.0000 mg | ORAL_TABLET | Freq: Once | ORAL | Status: AC
  Administered 2020-12-24: 650 mg via ORAL
  Filled 2020-12-24: qty 2

## 2020-12-24 MED ORDER — SODIUM CHLORIDE 0.9 % IV SOLN
Freq: Once | INTRAVENOUS | Status: AC
  Filled 2020-12-24: qty 250

## 2020-12-24 MED ORDER — HEPARIN SOD (PORK) LOCK FLUSH 100 UNIT/ML IV SOLN
500.0000 [IU] | Freq: Once | INTRAVENOUS | Status: AC | PRN
  Filled 2020-12-24: qty 5

## 2020-12-24 MED ORDER — HEPARIN SOD (PORK) LOCK FLUSH 100 UNIT/ML IV SOLN
INTRAVENOUS | Status: AC
  Administered 2020-12-24: 500 [IU]
  Filled 2020-12-24: qty 5

## 2020-12-24 MED ORDER — DIPHENHYDRAMINE HCL 25 MG PO CAPS
50.0000 mg | ORAL_CAPSULE | Freq: Once | ORAL | Status: AC
  Administered 2020-12-24: 50 mg via ORAL
  Filled 2020-12-24: qty 2

## 2020-12-24 NOTE — Patient Instructions (Signed)
[facility]  Discharge Instructions: Thank you for choosing [facility] to provide your oncology and hematology care.  If you have a lab appointment with the [facility], please go directly to the [facility] and check in at the registration area.  Wear comfortable clothing and clothing appropriate for easy access to any Portacath or PICC line.   We strive to give you quality time with your provider. You may need to reschedule your appointment if you arrive late (15 or more minutes).  Arriving late affects you and other patients whose appointments are after yours.  Also, if you miss three or more appointments without notifying the office, you may be dismissed from the clinic at the provider's discretion.      For prescription refill requests, have your pharmacy contact our office and allow 72 hours for refills to be completed.    Today you received the following chemotherapy and/or immunotherapy agents Ogivri & Perjeta      To help prevent nausea and vomiting after your treatment, we encourage you to take your nausea medication as directed.  BELOW ARE SYMPTOMS THAT SHOULD BE REPORTED IMMEDIATELY: *FEVER GREATER THAN 100.4 F (38 C) OR HIGHER *CHILLS OR SWEATING *NAUSEA AND VOMITING THAT IS NOT CONTROLLED WITH YOUR NAUSEA MEDICATION *UNUSUAL SHORTNESS OF BREATH *UNUSUAL BRUISING OR BLEEDING *URINARY PROBLEMS (pain or burning when urinating, or frequent urination) *BOWEL PROBLEMS (unusual diarrhea, constipation, pain near the anus) TENDERNESS IN MOUTH AND THROAT WITH OR WITHOUT PRESENCE OF ULCERS (sore throat, sores in mouth, or a toothache) UNUSUAL RASH, SWELLING OR PAIN  UNUSUAL VAGINAL DISCHARGE OR ITCHING   Items with * indicate a potential emergency and should be followed up as soon as possible or go to the Emergency Department if any problems should occur.  Please show the CHEMOTHERAPY ALERT CARD or IMMUNOTHERAPY ALERT CARD at  check-in to the Emergency Department and triage nurse.  Should you have questions after your visit or need to cancel or reschedule your appointment, please contact [facility]  [phone] and follow the prompts.  Office hours are 8:00 a.m. to 4:30 p.m. Monday - Friday. Please note that voicemails left after 4:00 p.m. may not be returned until the following business day.  We are closed weekends and major holidays. You have access to a nurse at all times for urgent questions. Please call the main number to the clinic [phone] and follow the prompts.  For any non-urgent questions, you may also contact your provider using MyChart. We now offer e-Visits for anyone 3 and older to request care online for non-urgent symptoms. For details visit [email or web address].   Also download the MyChart app! Go to the app store, search "MyChart", open the app, select [facility], and log in with your MyChart username and password.  Due to Covid, a mask is required upon entering the hospital/clinic. If you do not have a mask, one will be given to you upon arrival. For doctor visits, patients may have 1 support person aged 69 or older with them. For treatment visits, patients cannot have anyone with them due to current Covid guidelines and our immunocompromised population.

## 2021-01-05 ENCOUNTER — Encounter: Payer: Self-pay | Admitting: Oncology

## 2021-01-14 ENCOUNTER — Inpatient Hospital Stay: Payer: BC Managed Care – PPO | Attending: Oncology

## 2021-01-14 ENCOUNTER — Encounter: Payer: Self-pay | Admitting: Oncology

## 2021-01-14 ENCOUNTER — Other Ambulatory Visit: Payer: Self-pay

## 2021-01-14 ENCOUNTER — Inpatient Hospital Stay: Payer: BC Managed Care – PPO

## 2021-01-14 ENCOUNTER — Inpatient Hospital Stay (HOSPITAL_BASED_OUTPATIENT_CLINIC_OR_DEPARTMENT_OTHER): Payer: BC Managed Care – PPO | Admitting: Oncology

## 2021-01-14 VITALS — BP 126/73 | HR 78 | Temp 98.0°F | Resp 18 | Wt 176.3 lb

## 2021-01-14 DIAGNOSIS — C50412 Malignant neoplasm of upper-outer quadrant of left female breast: Secondary | ICD-10-CM

## 2021-01-14 DIAGNOSIS — Z170 Estrogen receptor positive status [ER+]: Secondary | ICD-10-CM | POA: Insufficient documentation

## 2021-01-14 DIAGNOSIS — Z79899 Other long term (current) drug therapy: Secondary | ICD-10-CM | POA: Diagnosis not present

## 2021-01-14 DIAGNOSIS — Z5112 Encounter for antineoplastic immunotherapy: Secondary | ICD-10-CM

## 2021-01-14 DIAGNOSIS — Z23 Encounter for immunization: Secondary | ICD-10-CM | POA: Diagnosis not present

## 2021-01-14 LAB — COMPREHENSIVE METABOLIC PANEL
ALT: 22 U/L (ref 0–44)
AST: 22 U/L (ref 15–41)
Albumin: 3.9 g/dL (ref 3.5–5.0)
Alkaline Phosphatase: 86 U/L (ref 38–126)
Anion gap: 8 (ref 5–15)
BUN: 21 mg/dL (ref 8–23)
CO2: 24 mmol/L (ref 22–32)
Calcium: 8.9 mg/dL (ref 8.9–10.3)
Chloride: 104 mmol/L (ref 98–111)
Creatinine, Ser: 0.91 mg/dL (ref 0.44–1.00)
GFR, Estimated: >60 mL/min (ref >60)
Glucose, Bld: 101 mg/dL — ABNORMAL HIGH (ref 70–99)
Potassium: 4 mmol/L (ref 3.5–5.1)
Sodium: 136 mmol/L (ref 135–145)
Total Bilirubin: 0.6 mg/dL (ref 0.3–1.2)
Total Protein: 7.2 g/dL (ref 6.5–8.1)

## 2021-01-14 LAB — CBC WITH DIFFERENTIAL/PLATELET
Abs Immature Granulocytes: 0.01 10*3/uL (ref 0.00–0.07)
Basophils Absolute: 0 10*3/uL (ref 0.0–0.1)
Basophils Relative: 1 %
Eosinophils Absolute: 0.3 10*3/uL (ref 0.0–0.5)
Eosinophils Relative: 4 %
HCT: 37.7 % (ref 36.0–46.0)
Hemoglobin: 12 g/dL (ref 12.0–15.0)
Immature Granulocytes: 0 %
Lymphocytes Relative: 21 %
Lymphs Abs: 1.5 10*3/uL (ref 0.7–4.0)
MCH: 20.9 pg — ABNORMAL LOW (ref 26.0–34.0)
MCHC: 31.8 g/dL (ref 30.0–36.0)
MCV: 65.7 fL — ABNORMAL LOW (ref 80.0–100.0)
Monocytes Absolute: 0.7 10*3/uL (ref 0.1–1.0)
Monocytes Relative: 10 %
Neutro Abs: 4.6 10*3/uL (ref 1.7–7.7)
Neutrophils Relative %: 64 %
Platelets: 258 10*3/uL (ref 150–400)
RBC: 5.74 MIL/uL — ABNORMAL HIGH (ref 3.87–5.11)
RDW: 13.2 % (ref 11.5–15.5)
WBC: 7 10*3/uL (ref 4.0–10.5)
nRBC: 0 % (ref 0.0–0.2)

## 2021-01-14 MED ORDER — SODIUM CHLORIDE 0.9% FLUSH
10.0000 mL | INTRAVENOUS | Status: DC | PRN
  Filled 2021-01-14: qty 10

## 2021-01-14 MED ORDER — ACETAMINOPHEN 325 MG PO TABS
650.0000 mg | ORAL_TABLET | Freq: Once | ORAL | Status: AC
  Administered 2021-01-14: 650 mg via ORAL
  Filled 2021-01-14: qty 2

## 2021-01-14 MED ORDER — HEPARIN SOD (PORK) LOCK FLUSH 100 UNIT/ML IV SOLN
INTRAVENOUS | Status: AC
  Filled 2021-01-14: qty 5

## 2021-01-14 MED ORDER — DIPHENHYDRAMINE HCL 25 MG PO CAPS
50.0000 mg | ORAL_CAPSULE | Freq: Once | ORAL | Status: AC
  Administered 2021-01-14: 50 mg via ORAL
  Filled 2021-01-14: qty 2

## 2021-01-14 MED ORDER — TRASTUZUMAB-DKST CHEMO 150 MG IV SOLR
450.0000 mg | Freq: Once | INTRAVENOUS | Status: AC
  Administered 2021-01-14: 450 mg via INTRAVENOUS
  Filled 2021-01-14: qty 21.43

## 2021-01-14 MED ORDER — SODIUM CHLORIDE 0.9 % IV SOLN
420.0000 mg | Freq: Once | INTRAVENOUS | Status: AC
  Administered 2021-01-14: 420 mg via INTRAVENOUS
  Filled 2021-01-14: qty 14

## 2021-01-14 MED ORDER — SODIUM CHLORIDE 0.9 % IV SOLN
Freq: Once | INTRAVENOUS | Status: AC
  Filled 2021-01-14: qty 250

## 2021-01-14 MED ORDER — HEPARIN SOD (PORK) LOCK FLUSH 100 UNIT/ML IV SOLN
500.0000 [IU] | Freq: Once | INTRAVENOUS | Status: AC | PRN
  Administered 2021-01-14: 500 [IU]
  Filled 2021-01-14: qty 5

## 2021-01-14 MED ORDER — INFLUENZA VAC SPLIT QUAD 0.5 ML IM SUSY
0.5000 mL | PREFILLED_SYRINGE | Freq: Once | INTRAMUSCULAR | Status: AC
  Administered 2021-01-14: 0.5 mL via INTRAMUSCULAR
  Filled 2021-01-14: qty 0.5

## 2021-01-14 MED ORDER — SODIUM CHLORIDE 0.9% FLUSH
10.0000 mL | Freq: Once | INTRAVENOUS | Status: AC
  Administered 2021-01-14: 10 mL via INTRAVENOUS
  Filled 2021-01-14: qty 10

## 2021-01-14 NOTE — Patient Instructions (Signed)
[facility]  Discharge Instructions: Thank you for choosing [facility] to provide your oncology and hematology care.  If you have a lab appointment with the [facility], please go directly to the [facility] and check in at the registration area.  Wear comfortable clothing and clothing appropriate for easy access to any Portacath or PICC line.   We strive to give you quality time with your provider. You may need to reschedule your appointment if you arrive late (15 or more minutes).  Arriving late affects you and other patients whose appointments are after yours.  Also, if you miss three or more appointments without notifying the office, you may be dismissed from the clinic at the provider's discretion.      For prescription refill requests, have your pharmacy contact our office and allow 72 hours for refills to be completed.    Today you received the following chemotherapy and/or immunotherapy agents herceptin, perjeta      To help prevent nausea and vomiting after your treatment, we encourage you to take your nausea medication as directed.  BELOW ARE SYMPTOMS THAT SHOULD BE REPORTED IMMEDIATELY: *FEVER GREATER THAN 100.4 F (38 C) OR HIGHER *CHILLS OR SWEATING *NAUSEA AND VOMITING THAT IS NOT CONTROLLED WITH YOUR NAUSEA MEDICATION *UNUSUAL SHORTNESS OF BREATH *UNUSUAL BRUISING OR BLEEDING *URINARY PROBLEMS (pain or burning when urinating, or frequent urination) *BOWEL PROBLEMS (unusual diarrhea, constipation, pain near the anus) TENDERNESS IN MOUTH AND THROAT WITH OR WITHOUT PRESENCE OF ULCERS (sore throat, sores in mouth, or a toothache) UNUSUAL RASH, SWELLING OR PAIN  UNUSUAL VAGINAL DISCHARGE OR ITCHING   Items with * indicate a potential emergency and should be followed up as soon as possible or go to the Emergency Department if any problems should occur.  Please show the CHEMOTHERAPY ALERT CARD or IMMUNOTHERAPY ALERT CARD at  check-in to the Emergency Department and triage nurse.  Should you have questions after your visit or need to cancel or reschedule your appointment, please contact [facility]  [phone] and follow the prompts.  Office hours are 8:00 a.m. to 4:30 p.m. Monday - Friday. Please note that voicemails left after 4:00 p.m. may not be returned until the following business day.  We are closed weekends and major holidays. You have access to a nurse at all times for urgent questions. Please call the main number to the clinic [phone] and follow the prompts.  For any non-urgent questions, you may also contact your provider using MyChart. We now offer e-Visits for anyone 43 and older to request care online for non-urgent symptoms. For details visit [email or web address].   Also download the MyChart app! Go to the app store, search "MyChart", open the app, select [facility], and log in with your MyChart username and password.  Due to Covid, a mask is required upon entering the hospital/clinic. If you do not have a mask, one will be given to you upon arrival. For doctor visits, patients may have 1 support person aged 17 or older with them. For treatment visits, patients cannot have anyone with them due to current Covid guidelines and our immunocompromised population.   Trastuzumab injection for infusion What is this medication? TRASTUZUMAB (tras TOO zoo mab) is a monoclonal antibody. It is used to treat breast cancer and stomach cancer. This medicine may be used for other purposes; ask your health care provider or pharmacist if you have questions. COMMON BRAND NAME(S): Herceptin, Belenda Cruise, Ogivri, Ontruzant,  Trazimera What should I tell my care team before I take this medication? They need to know if you have any of these conditions: heart disease heart failure lung or breathing disease, like asthma an unusual or allergic reaction to trastuzumab, benzyl  alcohol, or other medications, foods, dyes, or preservatives pregnant or trying to get pregnant breast-feeding How should I use this medication? This drug is given as an infusion into a vein. It is administered in a hospital or clinic by a specially trained health care professional. Talk to your pediatrician regarding the use of this medicine in children. This medicine is not approved for use in children. Overdosage: If you think you have taken too much of this medicine contact a poison control center or emergency room at once. NOTE: This medicine is only for you. Do not share this medicine with others. What if I miss a dose? It is important not to miss a dose. Call your doctor or health care professional if you are unable to keep an appointment. What may interact with this medication? This medicine may interact with the following medications: certain types of chemotherapy, such as daunorubicin, doxorubicin, epirubicin, and idarubicin This list may not describe all possible interactions. Give your health care provider a list of all the medicines, herbs, non-prescription drugs, or dietary supplements you use. Also tell them if you smoke, drink alcohol, or use illegal drugs. Some items may interact with your medicine. What should I watch for while using this medication? Visit your doctor for checks on your progress. Report any side effects. Continue your course of treatment even though you feel ill unless your doctor tells you to stop. Call your doctor or health care professional for advice if you get a fever, chills or sore throat, or other symptoms of a cold or flu. Do not treat yourself. Try to avoid being around people who are sick. You may experience fever, chills and shaking during your first infusion. These effects are usually mild and can be treated with other medicines. Report any side effects during the infusion to your health care professional. Fever and chills usually do not happen with  later infusions. Do not become pregnant while taking this medicine or for 7 months after stopping it. Women should inform their doctor if they wish to become pregnant or think they might be pregnant. Women of child-bearing potential will need to have a negative pregnancy test before starting this medicine. There is a potential for serious side effects to an unborn child. Talk to your health care professional or pharmacist for more information. Do not breast-feed an infant while taking this medicine or for 7 months after stopping it. Women must use effective birth control with this medicine. What side effects may I notice from receiving this medication? Side effects that you should report to your doctor or health care professional as soon as possible: allergic reactions like skin rash, itching or hives, swelling of the face, lips, or tongue chest pain or palpitations cough dizziness feeling faint or lightheaded, falls fever general ill feeling or flu-like symptoms signs of worsening heart failure like breathing problems; swelling in your legs and feet unusually weak or tired Side effects that usually do not require medical attention (report to your doctor or health care professional if they continue or are bothersome): bone pain changes in taste diarrhea joint pain nausea/vomiting weight loss This list may not describe all possible side effects. Call your doctor for medical advice about side effects. You may report side effects  to FDA at 1-800-FDA-1088. Where should I keep my medication? This drug is given in a hospital or clinic and will not be stored at home. NOTE: This sheet is a summary. It may not cover all possible information. If you have questions about this medicine, talk to your doctor, pharmacist, or health care provider.  2022 Elsevier/Gold Standard (2016-03-14 00:00:00)  Pertuzumab injection What is this medication? PERTUZUMAB (per TOOZ ue mab) is a monoclonal antibody. It  is used to treat breast cancer. This medicine may be used for other purposes; ask your health care provider or pharmacist if you have questions. COMMON BRAND NAME(S): PERJETA What should I tell my care team before I take this medication? They need to know if you have any of these conditions: heart disease heart failure high blood pressure history of irregular heart beat recent or ongoing radiation therapy an unusual or allergic reaction to pertuzumab, other medicines, foods, dyes, or preservatives pregnant or trying to get pregnant breast-feeding How should I use this medication? This medicine is for infusion into a vein. It is given by a health care professional in a hospital or clinic setting. Talk to your pediatrician regarding the use of this medicine in children. Special care may be needed. Overdosage: If you think you have taken too much of this medicine contact a poison control center or emergency room at once. NOTE: This medicine is only for you. Do not share this medicine with others. What if I miss a dose? It is important not to miss your dose. Call your doctor or health care professional if you are unable to keep an appointment. What may interact with this medication? Interactions are not expected. Give your health care provider a list of all the medicines, herbs, non-prescription drugs, or dietary supplements you use. Also tell them if you smoke, drink alcohol, or use illegal drugs. Some items may interact with your medicine. This list may not describe all possible interactions. Give your health care provider a list of all the medicines, herbs, non-prescription drugs, or dietary supplements you use. Also tell them if you smoke, drink alcohol, or use illegal drugs. Some items may interact with your medicine. What should I watch for while using this medication? Your condition will be monitored carefully while you are receiving this medicine. Report any side effects. Continue your  course of treatment even though you feel ill unless your doctor tells you to stop. Do not become pregnant while taking this medicine or for 7 months after stopping it. Women should inform their doctor if they wish to become pregnant or think they might be pregnant. Women of child-bearing potential will need to have a negative pregnancy test before starting this medicine. There is a potential for serious side effects to an unborn child. Talk to your health care professional or pharmacist for more information. Do not breast-feed an infant while taking this medicine or for 7 months after stopping it. Women must use effective birth control with this medicine. Call your doctor or health care professional for advice if you get a fever, chills or sore throat, or other symptoms of a cold or flu. Do not treat yourself. Try to avoid being around people who are sick. You may experience fever, chills, and headache during the infusion. Report any side effects during the infusion to your health care professional. What side effects may I notice from receiving this medication? Side effects that you should report to your doctor or health care professional as soon as possible: breathing  problems chest pain or palpitations dizziness feeling faint or lightheaded fever or chills skin rash, itching or hives sore throat swelling of the face, lips, or tongue swelling of the legs or ankles unusually weak or tired Side effects that usually do not require medical attention (report to your doctor or health care professional if they continue or are bothersome): diarrhea hair loss nausea, vomiting tiredness This list may not describe all possible side effects. Call your doctor for medical advice about side effects. You may report side effects to FDA at 1-800-FDA-1088. Where should I keep my medication? This drug is given in a hospital or clinic and will not be stored at home. NOTE: This sheet is a summary. It may not  cover all possible information. If you have questions about this medicine, talk to your doctor, pharmacist, or health care provider.  2022 Elsevier/Gold Standard (2015-04-01 00:00:00)

## 2021-01-14 NOTE — Progress Notes (Signed)
Pt tolerated all infusions well today with no problems or complaints.  Okay to administer flu shot today per Dr. Janese Banks.  Pt given flu shot to right deltoid, site benign.  Pt left infusion suite stable and ambulatory.

## 2021-01-14 NOTE — Progress Notes (Signed)
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  She self palpated a left breast mass which prompted a bilateral diagnostic mammogram on 06/25/2020.  That showed an irregular hypoechoic mass 2.5 x 1.7 x 1.8 cm at 1 o'clock position 1 cm from the nipple.  3 cm from the nipple were benign cysts.  1 mildly enlarged left axillary lymph node with cortical thickening 4 mm.  Both the breast mass and the lymph node were biopsied.  Left breast biopsy was consistent with invasive mammary carcinoma grade 3 ER weakly +1 to 10%, PR negative and HER2 positive by IHC.   Patient completed 4 cycles of neoadjuvant TCHP chemotherapy which was complicated by worsening skin rash and fatigue.  Patient did not wish to proceed with 2 further cycles of chemotherapy and proceeded with lumpectomy and sentinel lymph node biopsy.  Final pathology showed complete pathological response on 10/26/2020.  2 sentinel lymph nodes negative for malignancy.   Patient is completed adjuvant radiation treatment and is currently on adjuvant Herceptin and Perjeta.  She does not wish to take endocrine therapy for weakly ER positive tumor   Interval history-patient is tolerating Herceptin and Perjeta well.  She has occasional self-limited diarrhea.  Neuropathy is present in her hands and feet  but not affecting her quality of life.  She is not on any medications for it presently.  She feels like she could feel a mass in her left breastA few days ago.  ECOG PS- 1 Pain scale- 0   Review of systems- Review of Systems  Constitutional:  Positive for malaise/fatigue. Negative for chills, fever and weight loss.  HENT:  Negative for congestion, ear discharge and nosebleeds.   Eyes:  Negative for blurred vision.  Respiratory:  Negative for cough, hemoptysis, sputum production, shortness of breath and wheezing.   Cardiovascular:  Negative for chest pain, palpitations, orthopnea and claudication.  Gastrointestinal:  Negative for abdominal pain, blood in stool, constipation, diarrhea, heartburn, melena, nausea and vomiting.  Genitourinary:  Negative for dysuria, flank pain, frequency, hematuria and urgency.  Musculoskeletal:  Negative for back pain, joint pain and myalgias.  Skin:  Negative for rash.  Neurological:  Negative for dizziness, tingling, focal weakness, seizures, weakness and headaches.  Endo/Heme/Allergies:  Does not bruise/bleed easily.  Psychiatric/Behavioral:  Negative for depression and suicidal ideas. The patient does not have insomnia.      No Known Allergies   Past Medical History:  Diagnosis Date   Family history of adverse reaction to anesthesia    adopted   GERD (gastroesophageal reflux disease)      Past Surgical History:  Procedure Laterality Date   BREAST BIOPSY Left 07/05/2020   [place] Bx, Q clip, [facility]   BREAST BIOPSY Left 07/05/2020   [place] Bx, [facility], benign breast tissue with increased stromal fibrosis   CARPAL TUNNEL RELEASE Bilateral    CHOLECYSTECTOMY  GANGLION CYST EXCISION     wrist   PART MASTECTOMY,RADIO FREQUENCY LOCALIZER,AXILLARY SENTINEL NODE BIOPSY Left 10/26/2020   Procedure: PART MASTECTOMY,RADIO FREQUENCY LOCALIZER,AXILLARY SENTINEL NODE BIOPSY;  Surgeon: Herbert Pun, MD;  Location: [facility] ORS;  Service: General;  Laterality:  Left;   PORTACATH PLACEMENT Right 07/21/2020   Procedure: INSERTION PORT-A-CATH;  Surgeon: Herbert Pun, MD;  Location: [facility] ORS;  Service: General;  Laterality: Right;   right ovary removal      Social History   Socioeconomic History   Marital status: Married    Spouse name: Not on file   Number of children: Not on file   Years of education: Not on file   Highest education level: Not on file  Occupational History   Not on file  Tobacco Use   Smoking status: Former    Types: Cigarettes    Start date: 2012   Smokeless tobacco: Never  Vaping Use   Vaping Use: Never used  Substance and Sexual Activity   Alcohol use: Yes    Comment: occassional    Drug use: Never   Sexual activity: Not on file  Other Topics Concern   Not on file  Social History Narrative   Not on file   Social Determinants of Health   Financial Resource Strain: Not on file  Food Insecurity: Not on file  Transportation Needs: Not on file  Physical Activity: Not on file  Stress: Not on file  Social Connections: Not on file  Intimate Partner Violence: Not on file    Family History  Adopted: Yes  Problem Relation Age of Onset   Breast cancer Neg Hx      Current Outpatient Medications:    benzonatate (TESSALON) 100 MG capsule, Take 100 mg by mouth daily as needed for cough. (Patient not taking: No sig reported), Disp: , Rfl:    loratadine-pseudoephedrine (CLARITIN-D 24 HOUR) 10-240 MG 24 hr tablet, Take 1 tablet by mouth daily. (Patient not taking: No sig reported), Disp: 30 tablet, Rfl: 0   Multiple Vitamins-Minerals (EYE-VITE PLUS LUTEIN PO), Take 1 capsule by mouth 2 (two) times daily. AREDS2, Disp: , Rfl:    Multiple Vitamins-Minerals (MULTIVITAMIN WITH MINERALS) tablet, Take 1 tablet by mouth daily., Disp: , Rfl:    omeprazole (PRILOSEC) 40 MG capsule, Take 1 capsule (40 mg total) by mouth daily., Disp: 30 capsule, Rfl: 0   ondansetron (ZOFRAN-ODT) 4 MG disintegrating tablet, Take 4 mg by  mouth daily as needed for nausea/vomiting. (Patient not taking: No sig reported), Disp: , Rfl:    Polyethylene Glycol 400 (BLINK TEARS) 0.25 % SOLN, Apply 1 drop to eye 5 (five) times daily as needed. Every 2 hrs daily, Disp: , Rfl:    predniSONE (DELTASONE) 10 MG tablet, Take 1 tablet (10 mg total) by mouth daily with breakfast. Start with 5 tablets daily x 3 days , then 4 tablets x 3 day, then 3 tablets x 3 days, then 2 tablets x 3 days and then 1 tablet x 3 days (Patient not taking: No sig reported), Disp: 45 tablet, Rfl: 0   triamcinolone ointment (KENALOG) 0.5 %, Apply 1 application topically 2 (two) times daily. (Patient not taking: No sig reported), Disp: 30 g, Rfl: 0   vitamin B-12 (CYANOCOBALAMIN) 1000 MCG tablet, Take 1,000 mcg by mouth daily., Disp: , Rfl:    vitamin C (ASCORBIC ACID) 500 MG tablet, Take 500 mg by mouth daily., Disp: , Rfl:    vitamin E 180 MG (400 UNITS) capsule, Take  400 Units by mouth daily., Disp: , Rfl:  No current facility-administered medications for this visit.  Facility-Administered Medications Ordered in Other Visits:    sodium chloride flush (NS) 0.9 % injection 10 mL, 10 mL, Intravenous, Once, Sindy Guadeloupe, MD  Physical exam:  Vitals:   01/14/21 0844  BP: 126/73  Pulse: 78  Resp: 18  Temp: 98 F (36.7 C)  SpO2: 100%  Weight: 176 lb 4.8 oz (80 kg)   Physical Exam HENT:     Head: Normocephalic and atraumatic.  Eyes:     Pupils: Pupils are equal, round, and reactive to light.  Cardiovascular:     Rate and Rhythm: Normal rate and regular rhythm.     Heart sounds: Normal heart sounds.  Pulmonary:     Effort: Pulmonary effort is normal.     Breath sounds: Normal breath sounds.  Abdominal:     General: Bowel sounds are normal.     Palpations: Abdomen is soft.  Musculoskeletal:     Cervical back: Normal range of motion.  Skin:    General: Skin is warm and dry.  Neurological:     Mental Status: She is alert and oriented to person, place, and  time.   Breast exam was performed in seated and lying down position. Patient is status post left lumpectomy with a well-healed surgical scar. No evidence of any palpable masses.  CMP Latest Ref Rng & Units 01/14/2021  Glucose 70 - 99 mg/dL 101(H)  BUN 8 - 23 mg/dL 21  Creatinine 0.44 - 1.00 mg/dL 0.91  Sodium 135 - 145 mmol/L 136  Potassium 3.5 - 5.1 mmol/L 4.0  Chloride 98 - 111 mmol/L 104  CO2 22 - 32 mmol/L 24  Calcium 8.9 - 10.3 mg/dL 8.9  Total Protein 6.5 - 8.1 g/dL 7.2  Total Bilirubin 0.3 - 1.2 mg/dL 0.6  Alkaline Phos 38 - 126 U/L 86  AST 15 - 41 U/L 22  ALT 0 - 44 U/L 22   CBC Latest Ref Rng & Units 01/14/2021  WBC 4.0 - 10.5 K/uL 7.0  Hemoglobin 12.0 - 15.0 g/dL 12.0  Hematocrit 36.0 - 46.0 % 37.7  Platelets 150 - 400 K/uL 258     Assessment and plan- Patient is a 65 y.o. female with clinical prognostic stage IIa T2 N0 M0 grade 3 invasive mammary carcinoma ER weakly positive, PR negative and HER2 positive.  She received 4 cycles of neoadjuvant TCHP chemotherapy with complete pathological response.  She is here for on treatment assessment prior to cycle 4 of adjuvant Herceptin and Perjeta  Counts okay to proceed with cycle 4 of adjuvant Herceptin and Perjeta today.  This will be dose 8.  She will directly proceed for dose 9 in 3 weeks and I will see her on that day.  Plan is to complete 18 total doses.  We will obtain an echocardiogram in the next 3 to 4 weeks  I do not palpate any concerning mass in her left breast.  If she has persistent symptoms I will consider getting an interim ultrasound.  She would be due for herA routine screening mammogram in April 2023.  I did discuss that she has weakly ER positive tumor and there would be a role for hormone therapy although she may not derive a whole lot of benefit.  Patient understands risks and benefits of hormone therapy and does not wish to start on aromatase inhibitors   Visit Diagnosis 1. Encounter for monoclonal antibody  treatment for  malignancy   2. Malignant neoplasm of upper-outer quadrant of left breast in female, estrogen receptor positive ([place])      Dr. Randa Evens, MD, MPH [facility] [phone] 01/14/2021 12:45 PM

## 2021-01-14 NOTE — Progress Notes (Signed)
Pt states she is overly sensetive to "changes" still having issues with lymph nodes; not painful but uncomfortable, believes she felt a mass but may b a lymph node. her left side had surgery; but its her right side tht is bothering her. Will also like to know how ong does it take for lymph nodes

## 2021-01-24 ENCOUNTER — Other Ambulatory Visit: Payer: Self-pay

## 2021-01-24 ENCOUNTER — Ambulatory Visit
Admission: RE | Admit: 2021-01-24 | Discharge: 2021-01-24 | Disposition: A | Discharge status: Home / Self Care | Payer: BC Managed Care – PPO | Source: Ambulatory Visit | Attending: Radiation Oncology | Admitting: Radiation Oncology

## 2021-01-24 VITALS — BP 124/77 | HR 79 | Temp 97.4°F | Resp 16 | Wt 175.6 lb

## 2021-01-24 DIAGNOSIS — Z79811 Long term (current) use of aromatase inhibitors: Secondary | ICD-10-CM | POA: Diagnosis not present

## 2021-01-24 DIAGNOSIS — Z170 Estrogen receptor positive status [ER+]: Secondary | ICD-10-CM | POA: Diagnosis not present

## 2021-01-24 DIAGNOSIS — C50412 Malignant neoplasm of upper-outer quadrant of left female breast: Secondary | ICD-10-CM | POA: Insufficient documentation

## 2021-01-24 DIAGNOSIS — Z923 Personal history of irradiation: Secondary | ICD-10-CM | POA: Insufficient documentation

## 2021-01-24 NOTE — Progress Notes (Signed)
Radiation Oncology Follow up Note  Name: Wendy Caldwell   Date:   01/24/2021 MRN:  [id number] DOB: 06/16/1955    This 65 y.o. 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  REFERRING PROVIDER: Kirk Ruths, MD  HPI: Patient is a 65 year old female now out 1 month having completed whole breast radiation to her left breast.  Seen today in routine follow-up she is doing well specifically denies breast tenderness cough or bone pain..  She is currently receiving adjuvant Herceptin and Perjeta which she is tolerating well.  She was offered aromatase inhibitors although has declined.  COMPLICATIONS OF TREATMENT: none  FOLLOW UP COMPLIANCE: keeps appointments   PHYSICAL EXAM:  BP 124/77   Pulse 79   Temp (!) 97.4 F (36.3 C) (Tympanic)   Resp 16   Wt 175 lb 9.6 oz (79.7 kg)   BMI 30.14 kg/m  Lungs are clear to A&P cardiac examination essentially unremarkable with regular rate and rhythm. No dominant mass or nodularity is noted in either breast in 2 positions examined. Incision is well-healed. No axillary or supraclavicular adenopathy is appreciated. Cosmetic result is excellent.  Well-developed well-nourished patient in NAD. HEENT reveals PERLA, EOMI, discs not visualized.  Oral cavity is clear. No oral mucosal lesions are identified. Neck is clear without evidence of cervical or supraclavicular adenopathy. Lungs are clear to A&P. Cardiac examination is essentially unremarkable with regular rate and rhythm without murmur rub or thrill. Abdomen is benign with no organomegaly or masses noted. Motor sensory and DTR levels are equal and symmetric in the upper and lower extremities. Cranial nerves II through XII are grossly intact. Proprioception is intact. No peripheral adenopathy or edema is identified. No  motor or sensory levels are noted. Crude visual fields are within normal range.  RADIOLOGY RESULTS: No current films for review  PLAN: Present time patient is doing well 1 month out from whole breast radiation and pleased with her overall progress.  She continues treatment through medical oncology.  I have asked to see her back in 4 to 5 months for follow-up.  Patient knows to call with any concerns.  I would like to take this opportunity to thank you for allowing me to participate in the care of your patient.Noreene Filbert, MD

## 2021-01-28 ENCOUNTER — Ambulatory Visit: Admission: RE | Admit: 2021-01-28 | Payer: BC Managed Care – PPO | Source: Ambulatory Visit

## 2021-02-01 ENCOUNTER — Encounter: Payer: Self-pay | Admitting: Oncology

## 2021-02-07 ENCOUNTER — Inpatient Hospital Stay: Payer: BC Managed Care – PPO | Attending: Oncology | Admitting: Oncology

## 2021-02-07 ENCOUNTER — Other Ambulatory Visit: Payer: Self-pay

## 2021-02-07 ENCOUNTER — Inpatient Hospital Stay: Payer: BC Managed Care – PPO

## 2021-02-07 ENCOUNTER — Encounter: Payer: Self-pay | Admitting: Oncology

## 2021-02-07 VITALS — BP 125/75 | HR 72 | Temp 97.8°F | Resp 18 | Wt 175.6 lb

## 2021-02-07 VITALS — BP 130/80 | HR 64 | Resp 16

## 2021-02-07 DIAGNOSIS — Z170 Estrogen receptor positive status [ER+]: Secondary | ICD-10-CM

## 2021-02-07 DIAGNOSIS — Z5112 Encounter for antineoplastic immunotherapy: Secondary | ICD-10-CM | POA: Insufficient documentation

## 2021-02-07 DIAGNOSIS — Z79899 Other long term (current) drug therapy: Secondary | ICD-10-CM | POA: Insufficient documentation

## 2021-02-07 DIAGNOSIS — C773 Secondary and unspecified malignant neoplasm of axilla and upper limb lymph nodes: Secondary | ICD-10-CM | POA: Diagnosis not present

## 2021-02-07 DIAGNOSIS — C50412 Malignant neoplasm of upper-outer quadrant of left female breast: Secondary | ICD-10-CM | POA: Insufficient documentation

## 2021-02-07 MED ORDER — TRASTUZUMAB-ANNS CHEMO 150 MG IV SOLR
450.0000 mg | Freq: Once | INTRAVENOUS | Status: AC
  Administered 2021-02-07: 12:00:00 450 mg via INTRAVENOUS
  Filled 2021-02-07: qty 21.43

## 2021-02-07 MED ORDER — DIPHENHYDRAMINE HCL 25 MG PO CAPS
50.0000 mg | ORAL_CAPSULE | Freq: Once | ORAL | Status: AC
  Administered 2021-02-07: 11:00:00 50 mg via ORAL
  Filled 2021-02-07: qty 2

## 2021-02-07 MED ORDER — SODIUM CHLORIDE 0.9 % IV SOLN
420.0000 mg | Freq: Once | INTRAVENOUS | Status: AC
  Administered 2021-02-07: 420 mg via INTRAVENOUS
  Filled 2021-02-07: qty 14

## 2021-02-07 MED ORDER — SODIUM CHLORIDE 0.9% FLUSH
10.0000 mL | INTRAVENOUS | Status: DC | PRN
  Administered 2021-02-07: 11:00:00 10 mL
  Filled 2021-02-07: qty 10

## 2021-02-07 MED ORDER — HEPARIN SOD (PORK) LOCK FLUSH 100 UNIT/ML IV SOLN
500.0000 [IU] | Freq: Once | INTRAVENOUS | Status: AC | PRN
  Filled 2021-02-07: qty 5

## 2021-02-07 MED ORDER — ACETAMINOPHEN 325 MG PO TABS
650.0000 mg | ORAL_TABLET | Freq: Once | ORAL | Status: AC
  Administered 2021-02-07: 11:00:00 650 mg via ORAL
  Filled 2021-02-07: qty 2

## 2021-02-07 MED ORDER — HEPARIN SOD (PORK) LOCK FLUSH 100 UNIT/ML IV SOLN
INTRAVENOUS | Status: AC
  Administered 2021-02-07: 13:00:00 500 [IU]
  Filled 2021-02-07: qty 5

## 2021-02-07 MED ORDER — SODIUM CHLORIDE 0.9 % IV SOLN
Freq: Once | INTRAVENOUS | Status: AC
  Filled 2021-02-07: qty 250

## 2021-02-07 NOTE — Progress Notes (Signed)
Hematology/Oncology Consult note [facility]  Telephone:([phone] [phone]  Patient Care Team: Kirk Ruths, MD as PCP - General (Internal Medicine) Rico Junker, RN as Oncology Nurse Navigator   Name of the patient: Wendy Caldwell  [id number]  05/04/1955   Date of visit: 02/07/21  Diagnosis-   left breast cancer clinical prognostic stage IIA cT2 N0 M0 ER weakly positive, PR negative HER2 positive  Chief complaint/ Reason for visit-on treatment assessment prior to cycle 4 of adjuvant Herceptin and Perjeta.  Dose 9  Heme/Onc history: Patient is a 65 year old female with a past medical history significant for psoriasis for which she is on Humira.  She self palpated a left breast mass which prompted a bilateral diagnostic mammogram on 06/25/2020.  That showed an irregular hypoechoic mass 2.5 x 1.7 x 1.8 cm at 1 o'clock position 1 cm from the nipple.  3 cm from the nipple were benign cysts.  1 mildly enlarged left axillary lymph node with cortical thickening 4 mm.  Both the breast mass and the lymph node were biopsied.  Left breast biopsy was consistent with invasive mammary carcinoma grade 3 ER weakly +1 to 10%, PR negative and HER2 positive by IHC.   Patient completed 4 cycles of neoadjuvant TCHP chemotherapy which was complicated by worsening skin rash and fatigue.  Patient did not wish to proceed with 2 further cycles of chemotherapy and proceeded with lumpectomy and sentinel lymph node biopsy.  Final pathology showed complete pathological response on 10/26/2020.  2 sentinel lymph nodes negative for malignancy.   Patient is completed adjuvant radiation treatment and is currently on adjuvant Herceptin and Perjeta.  She does not wish to take endocrine therapy for weakly ER positive tumor  Interval history-patient is tolerating Herceptin and Perjeta well.  Denies any skin rash.  Mild self-limited diarrhea.  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  ECOG PS- 1 Pain scale- 0   Review of systems- Review of Systems  Constitutional:  Negative for chills, fever, malaise/fatigue and weight loss.  HENT:  Negative for congestion, ear discharge and nosebleeds.   Eyes:  Negative for blurred vision.  Respiratory:  Negative for cough, hemoptysis, sputum production, shortness of breath and wheezing.   Cardiovascular:  Negative for chest pain, palpitations, orthopnea and claudication.  Gastrointestinal:  Positive for diarrhea. Negative for abdominal pain, blood in stool, constipation, heartburn, melena, nausea and vomiting.  Genitourinary:  Negative for dysuria, flank pain, frequency, hematuria and urgency.  Musculoskeletal:  Negative for back pain, joint pain and myalgias.  Skin:  Negative for rash.  Neurological:  Negative for dizziness, tingling, focal weakness, seizures, weakness and headaches.  Endo/Heme/Allergies:  Does not bruise/bleed easily.  Psychiatric/Behavioral:  Negative for depression and suicidal ideas. The patient does not have insomnia.       Not on File   Past Medical History:  Diagnosis Date   Family history of adverse reaction to anesthesia    adopted   GERD (gastroesophageal reflux disease)      Past Surgical History:  Procedure Laterality Date   BREAST BIOPSY Left 07/05/2020   [place] Bx, Q clip, [facility]   BREAST BIOPSY Left 07/05/2020   [place] Bx, [facility], benign breast tissue with increased stromal fibrosis   CARPAL TUNNEL RELEASE Bilateral    CHOLECYSTECTOMY     GANGLION CYST EXCISION     wrist   PART MASTECTOMY,RADIO FREQUENCY LOCALIZER,AXILLARY SENTINEL NODE BIOPSY Left 10/26/2020  Procedure: PART [place] NODE BIOPSY;  Surgeon: Herbert Pun, MD;  Location: [facility] ORS;  Service: General;  Laterality: Left;   PORTACATH PLACEMENT Right 07/21/2020   Procedure: INSERTION PORT-A-CATH;  Surgeon: Herbert Pun, MD;  Location: [facility] ORS;  Service: General;  Laterality: Right;   right ovary removal      Social History   Socioeconomic History   Marital status: Married    Spouse name: Not on file   Number of children: Not on file   Years of education: Not on file   Highest education level: Not on file  Occupational History   Not on file  Tobacco Use   Smoking status: Former    Types: Cigarettes    Start date: 2012   Smokeless tobacco: Never  Vaping Use   Vaping Use: Never used  Substance and Sexual Activity   Alcohol use: Yes    Comment: occassional    Drug use: Never   Sexual activity: Not on file  Other Topics Concern   Not on file  Social History Narrative   Not on file   Social Determinants of Health   Financial Resource Strain: Not on file  Food Insecurity: Not on file  Transportation Needs: Not on file  Physical Activity: Not on file  Stress: Not on file  Social Connections: Not on file  Intimate Partner Violence: Not on file    Family History  Adopted: Yes  Problem Relation Age of Onset   Breast cancer Neg Hx      Current Outpatient Medications:    Multiple Vitamins-Minerals (EYE-VITE PLUS LUTEIN PO), Take 1 capsule by mouth 2 (two) times daily. AREDS2, Disp: , Rfl:    Multiple Vitamins-Minerals (MULTIVITAMIN WITH MINERALS) tablet, Take 1 tablet by mouth daily., Disp: , Rfl:    omeprazole (PRILOSEC) 40 MG capsule, Take 1 capsule (40 mg total) by mouth daily., Disp: 30 capsule, Rfl: 0   Polyethylene Glycol 400 (BLINK TEARS) 0.25 % SOLN, Apply 1 drop to eye 5 (five) times daily as needed. Every 2 hrs daily, Disp: , Rfl:    vitamin B-12 (CYANOCOBALAMIN) 1000 MCG tablet, Take 1,000 mcg by mouth daily., Disp: , Rfl:    vitamin C (ASCORBIC ACID) 500 MG tablet, Take 500 mg by mouth daily., Disp: , Rfl:    vitamin E 180 MG (400 UNITS) capsule, Take 400 Units by mouth daily., Disp: , Rfl:    benzonatate (TESSALON) 100 MG capsule, Take 100 mg by mouth daily as  needed for cough. (Patient not taking: Reported on 10/29/2020), Disp: , Rfl:    loratadine-pseudoephedrine (CLARITIN-D 24 HOUR) 10-240 MG 24 hr tablet, Take 1 tablet by mouth daily. (Patient not taking: Reported on 10/29/2020), Disp: 30 tablet, Rfl: 0   ondansetron (ZOFRAN-ODT) 4 MG disintegrating tablet, Take 4 mg by mouth daily as needed for nausea/vomiting. (Patient not taking: Reported on 10/29/2020), Disp: , Rfl:    predniSONE (DELTASONE) 10 MG tablet, Take 1 tablet (10 mg total) by mouth daily with breakfast. Start with 5 tablets daily x 3 days , then 4 tablets x 3 day, then 3 tablets x 3 days, then 2 tablets x 3 days and then 1 tablet x 3 days (Patient not taking: Reported on 10/29/2020), Disp: 45 tablet, Rfl: 0   triamcinolone ointment (KENALOG) 0.5 %, Apply 1 application topically 2 (two) times daily. (Patient not taking: Reported on 10/29/2020), Disp: 30 g, Rfl: 0 No current facility-administered medications for this visit.  Facility-Administered Medications Ordered in  Other Visits:    sodium chloride flush (NS) 0.9 % injection 10 mL, 10 mL, Intracatheter, PRN, Sindy Guadeloupe, MD, 10 mL at 02/07/21 1058  Physical exam:  Vitals:   02/07/21 0900  BP: 125/75  Pulse: 72  Resp: 18  Temp: 97.8 F (36.6 C)  TempSrc: Tympanic  SpO2: 99%  Weight: 175 lb 9.6 oz (79.7 kg)   Physical Exam Constitutional:      General: She is not in acute distress. Cardiovascular:     Rate and Rhythm: Normal rate and regular rhythm.     Heart sounds: Normal heart sounds.  Pulmonary:     Effort: Pulmonary effort is normal.  Skin:    General: Skin is warm and dry.  Neurological:     Mental Status: She is alert and oriented to person, place, and time.     CMP Latest Ref Rng & Units 01/14/2021  Glucose 70 - 99 mg/dL 101(H)  BUN 8 - 23 mg/dL 21  Creatinine 0.44 - 1.00 mg/dL 0.91  Sodium 135 - 145 mmol/L 136  Potassium 3.5 - 5.1 mmol/L 4.0  Chloride 98 - 111 mmol/L 104  CO2 22 - 32 mmol/L 24  Calcium 8.9  - 10.3 mg/dL 8.9  Total Protein 6.5 - 8.1 g/dL 7.2  Total Bilirubin 0.3 - 1.2 mg/dL 0.6  Alkaline Phos 38 - 126 U/L 86  AST 15 - 41 U/L 22  ALT 0 - 44 U/L 22   CBC Latest Ref Rng & Units 01/14/2021  WBC 4.0 - 10.5 K/uL 7.0  Hemoglobin 12.0 - 15.0 g/dL 12.0  Hematocrit 36.0 - 46.0 % 37.7  Platelets 150 - 400 K/uL 258     Assessment and plan- Patient is a 65 y.o. female  with clinical prognostic stage IIa T2 N0 M0 grade 3 invasive mammary carcinoma ER weakly positive, PR negative and HER2 positive.  She received 4 cycles of neoadjuvant TCHP chemotherapy with complete pathological response.  She is here for on treatment assessment prior to cycle 5 of adjuvant Herceptin and Perjeta  She is okay to proceed with cycle 5 of adjuvant Herceptin and Perjeta today.  No labs need to be checked today.  Echocardiogram prior to next visit.  She will directly proceed with cycle 6 in 3 weeks with labs CBC with differential and CMP and I will see her back in 6 weeks for cycle 7.  Plan is to complete 1 year of treatment.   Visit Diagnosis 1. Encounter for monoclonal antibody treatment for malignancy   2. Malignant neoplasm of upper-outer quadrant of left breast in female, estrogen receptor positive ([place])      Dr. Randa Evens, MD, MPH [facility] [phone] 02/07/2021 1:34 PM

## 2021-02-07 NOTE — Progress Notes (Signed)
1049- Last Echocardiogram was done on 10/18/2020. MD, Dr. Janese Banks, notified and aware. Per MD order: okay to reference 10/18/2020 Echocardiogram results and proceed with scheduled Kanjinti and Perjeta treatment today; MD is ordering for another Echocardiogram to be done ASAP.

## 2021-02-07 NOTE — Patient Instructions (Signed)
[facility]   Discharge Instructions: Thank you for choosing [facility] to provide your oncology and hematology care.  If you have a lab appointment with the [facility], please go directly to the [facility] and check in at the registration area.  Wear comfortable clothing and clothing appropriate for easy access to any Portacath or PICC line.   We strive to give you quality time with your provider. You may need to reschedule your appointment if you arrive late (15 or more minutes).  Arriving late affects you and other patients whose appointments are after yours.  Also, if you miss three or more appointments without notifying the office, you may be dismissed from the clinic at the provider's discretion.      For prescription refill requests, have your pharmacy contact our office and allow 72 hours for refills to be completed.    Today you received the following chemotherapy and/or immunotherapy agents: Kanjinti and Perjeta.      To help prevent nausea and vomiting after your treatment, we encourage you to take your nausea medication as directed.  BELOW ARE SYMPTOMS THAT SHOULD BE REPORTED IMMEDIATELY: *FEVER GREATER THAN 100.4 F (38 C) OR HIGHER *CHILLS OR SWEATING *NAUSEA AND VOMITING THAT IS NOT CONTROLLED WITH YOUR NAUSEA MEDICATION *UNUSUAL SHORTNESS OF BREATH *UNUSUAL BRUISING OR BLEEDING *URINARY PROBLEMS (pain or burning when urinating, or frequent urination) *BOWEL PROBLEMS (unusual diarrhea, constipation, pain near the anus) TENDERNESS IN MOUTH AND THROAT WITH OR WITHOUT PRESENCE OF ULCERS (sore throat, sores in mouth, or a toothache) UNUSUAL RASH, SWELLING OR PAIN  UNUSUAL VAGINAL DISCHARGE OR ITCHING   Items with * indicate a potential emergency and should be followed up as soon as possible or go to the Emergency Department if any problems should occur.  Please show the CHEMOTHERAPY ALERT CARD or IMMUNOTHERAPY ALERT  CARD at check-in to the Emergency Department and triage nurse.  Should you have questions after your visit or need to cancel or reschedule your appointment, please contact [facility]  [phone] and follow the prompts.  Office hours are 8:00 a.m. to 4:30 p.m. Monday - Friday. Please note that voicemails left after 4:00 p.m. may not be returned until the following business day.  We are closed weekends and major holidays. You have access to a nurse at all times for urgent questions. Please call the main number to the clinic [phone] and follow the prompts.  For any non-urgent questions, you may also contact your provider using MyChart. We now offer e-Visits for anyone 50 and older to request care online for non-urgent symptoms. For details visit [email or web address].   Also download the MyChart app! Go to the app store, search "MyChart", open the app, select [facility], and log in with your MyChart username and password.  Due to Covid, a mask is required upon entering the hospital/clinic. If you do not have a mask, one will be given to you upon arrival. For doctor visits, patients may have 1 support person aged 48 or older with them. For treatment visits, patients cannot have anyone with them due to current Covid guidelines and our immunocompromised population.

## 2021-02-11 ENCOUNTER — Ambulatory Visit: Admission: RE | Admit: 2021-02-11 | Payer: BC Managed Care – PPO | Source: Ambulatory Visit

## 2021-02-28 ENCOUNTER — Inpatient Hospital Stay: Payer: BC Managed Care – PPO | Attending: Oncology

## 2021-02-28 ENCOUNTER — Ambulatory Visit: Payer: BC Managed Care – PPO

## 2021-02-28 ENCOUNTER — Other Ambulatory Visit: Payer: Self-pay

## 2021-02-28 ENCOUNTER — Inpatient Hospital Stay: Payer: BC Managed Care – PPO

## 2021-02-28 ENCOUNTER — Ambulatory Visit: Payer: BC Managed Care – PPO | Admitting: Nurse Practitioner

## 2021-02-28 VITALS — BP 110/58 | HR 79 | Resp 18 | Wt 177.4 lb

## 2021-02-28 DIAGNOSIS — Z170 Estrogen receptor positive status [ER+]: Secondary | ICD-10-CM | POA: Diagnosis not present

## 2021-02-28 DIAGNOSIS — Z5112 Encounter for antineoplastic immunotherapy: Secondary | ICD-10-CM | POA: Diagnosis not present

## 2021-02-28 DIAGNOSIS — Z79899 Other long term (current) drug therapy: Secondary | ICD-10-CM | POA: Diagnosis not present

## 2021-02-28 DIAGNOSIS — C50412 Malignant neoplasm of upper-outer quadrant of left female breast: Secondary | ICD-10-CM

## 2021-02-28 LAB — CBC WITH DIFFERENTIAL/PLATELET
Abs Immature Granulocytes: 0.02 10*3/uL (ref 0.00–0.07)
Basophils Absolute: 0 10*3/uL (ref 0.0–0.1)
Basophils Relative: 1 %
Eosinophils Absolute: 0.2 10*3/uL (ref 0.0–0.5)
Eosinophils Relative: 3 %
HCT: 37.3 % (ref 36.0–46.0)
Hemoglobin: 11.8 g/dL — ABNORMAL LOW (ref 12.0–15.0)
Immature Granulocytes: 0 %
Lymphocytes Relative: 23 %
Lymphs Abs: 1.6 10*3/uL (ref 0.7–4.0)
MCH: 20.1 pg — ABNORMAL LOW (ref 26.0–34.0)
MCHC: 31.6 g/dL (ref 30.0–36.0)
MCV: 63.7 fL — ABNORMAL LOW (ref 80.0–100.0)
Monocytes Absolute: 0.8 10*3/uL (ref 0.1–1.0)
Monocytes Relative: 12 %
Neutro Abs: 4.2 10*3/uL (ref 1.7–7.7)
Neutrophils Relative %: 61 %
Platelets: 280 10*3/uL (ref 150–400)
RBC: 5.86 MIL/uL — ABNORMAL HIGH (ref 3.87–5.11)
RDW: 14.6 % (ref 11.5–15.5)
WBC: 6.9 10*3/uL (ref 4.0–10.5)
nRBC: 0 % (ref 0.0–0.2)

## 2021-02-28 LAB — COMPREHENSIVE METABOLIC PANEL WITH GFR
ALT: 23 U/L (ref 0–44)
AST: 22 U/L (ref 15–41)
Albumin: 3.8 g/dL (ref 3.5–5.0)
Alkaline Phosphatase: 95 U/L (ref 38–126)
Anion gap: 8 (ref 5–15)
BUN: 21 mg/dL (ref 8–23)
CO2: 24 mmol/L (ref 22–32)
Calcium: 8.9 mg/dL (ref 8.9–10.3)
Chloride: 104 mmol/L (ref 98–111)
Creatinine, Ser: 0.78 mg/dL (ref 0.44–1.00)
GFR, Estimated: >60 mL/min
Glucose, Bld: 106 mg/dL — ABNORMAL HIGH (ref 70–99)
Potassium: 3.7 mmol/L (ref 3.5–5.1)
Sodium: 136 mmol/L (ref 135–145)
Total Bilirubin: 0.8 mg/dL (ref 0.3–1.2)
Total Protein: 6.8 g/dL (ref 6.5–8.1)

## 2021-02-28 MED ORDER — SODIUM CHLORIDE 0.9% FLUSH
10.0000 mL | Freq: Once | INTRAVENOUS | Status: AC
  Administered 2021-02-28: 09:00:00 10 mL via INTRAVENOUS
  Filled 2021-02-28: qty 10

## 2021-02-28 MED ORDER — SODIUM CHLORIDE 0.9 % IV SOLN
Freq: Once | INTRAVENOUS | Status: AC
  Filled 2021-02-28: qty 250

## 2021-02-28 MED ORDER — DIPHENHYDRAMINE HCL 25 MG PO CAPS
50.0000 mg | ORAL_CAPSULE | Freq: Once | ORAL | Status: AC
  Administered 2021-02-28: 09:00:00 50 mg via ORAL
  Filled 2021-02-28: qty 2

## 2021-02-28 MED ORDER — TRASTUZUMAB-ANNS CHEMO 150 MG IV SOLR
450.0000 mg | Freq: Once | INTRAVENOUS | Status: AC
  Administered 2021-02-28: 10:00:00 450 mg via INTRAVENOUS
  Filled 2021-02-28: qty 21.43

## 2021-02-28 MED ORDER — SODIUM CHLORIDE 0.9 % IV SOLN
420.0000 mg | Freq: Once | INTRAVENOUS | Status: AC
  Administered 2021-02-28: 11:00:00 420 mg via INTRAVENOUS
  Filled 2021-02-28: qty 14

## 2021-02-28 MED ORDER — HEPARIN SOD (PORK) LOCK FLUSH 100 UNIT/ML IV SOLN
500.0000 [IU] | Freq: Once | INTRAVENOUS | Status: AC | PRN
  Administered 2021-02-28: 11:00:00 500 [IU]
  Filled 2021-02-28: qty 5

## 2021-02-28 MED ORDER — ACETAMINOPHEN 325 MG PO TABS
650.0000 mg | ORAL_TABLET | Freq: Once | ORAL | Status: AC
  Administered 2021-02-28: 09:00:00 650 mg via ORAL
  Filled 2021-02-28: qty 2

## 2021-03-03 ENCOUNTER — Ambulatory Visit: Payer: BC Managed Care – PPO

## 2021-03-15 ENCOUNTER — Ambulatory Visit
Admission: RE | Admit: 2021-03-15 | Discharge: 2021-03-15 | Disposition: A | Discharge status: Home / Self Care | Payer: BC Managed Care – PPO | Source: Ambulatory Visit | Attending: Oncology | Admitting: Oncology

## 2021-03-15 DIAGNOSIS — C50412 Malignant neoplasm of upper-outer quadrant of left female breast: Secondary | ICD-10-CM | POA: Diagnosis not present

## 2021-03-15 DIAGNOSIS — K219 Gastro-esophageal reflux disease without esophagitis: Secondary | ICD-10-CM | POA: Diagnosis not present

## 2021-03-15 DIAGNOSIS — Z853 Personal history of malignant neoplasm of breast: Secondary | ICD-10-CM | POA: Diagnosis not present

## 2021-03-15 DIAGNOSIS — I340 Nonrheumatic mitral (valve) insufficiency: Secondary | ICD-10-CM | POA: Insufficient documentation

## 2021-03-15 DIAGNOSIS — Z0189 Encounter for other specified special examinations: Secondary | ICD-10-CM | POA: Diagnosis not present

## 2021-03-15 DIAGNOSIS — Z170 Estrogen receptor positive status [ER+]: Secondary | ICD-10-CM | POA: Diagnosis not present

## 2021-03-15 LAB — ECHOCARDIOGRAM COMPLETE
AR max vel: 2.78 cm2
AV Area VTI: 3.11 cm2
AV Area mean vel: 2.91 cm2
AV Mean grad: 3 mmHg
AV Peak grad: 6 mmHg
Ao pk vel: 1.22 m/s
Area-P 1/2: 3.91 cm2
MV VTI: 3.77 cm2
S' Lateral: 2.7 cm

## 2021-03-15 NOTE — Progress Notes (Signed)
*  PRELIMINARY RESULTS* Echocardiogram 2D Echocardiogram has been performed.  Wendy Caldwell 03/15/2021, 10:39 AM

## 2021-03-21 ENCOUNTER — Inpatient Hospital Stay: Payer: BC Managed Care – PPO

## 2021-03-21 ENCOUNTER — Inpatient Hospital Stay: Payer: BC Managed Care – PPO | Attending: Oncology | Admitting: Oncology

## 2021-03-21 ENCOUNTER — Other Ambulatory Visit: Payer: Self-pay

## 2021-03-21 ENCOUNTER — Encounter: Payer: Self-pay | Admitting: Oncology

## 2021-03-21 VITALS — BP 110/72 | HR 80 | Temp 97.2°F | Resp 16 | Ht 64.0 in | Wt 179.8 lb

## 2021-03-21 DIAGNOSIS — Z170 Estrogen receptor positive status [ER+]: Secondary | ICD-10-CM | POA: Diagnosis not present

## 2021-03-21 DIAGNOSIS — Z5112 Encounter for antineoplastic immunotherapy: Secondary | ICD-10-CM | POA: Insufficient documentation

## 2021-03-21 DIAGNOSIS — C50412 Malignant neoplasm of upper-outer quadrant of left female breast: Secondary | ICD-10-CM | POA: Diagnosis not present

## 2021-03-21 DIAGNOSIS — Z79899 Other long term (current) drug therapy: Secondary | ICD-10-CM | POA: Diagnosis not present

## 2021-03-21 MED ORDER — TRASTUZUMAB-ANNS CHEMO 150 MG IV SOLR: 6.0000 mg/kg | Freq: Once | INTRAVENOUS | Status: DC

## 2021-03-21 MED ORDER — ACETAMINOPHEN 325 MG PO TABS
650.0000 mg | ORAL_TABLET | Freq: Once | ORAL | Status: AC
  Administered 2021-03-21: 650 mg via ORAL
  Filled 2021-03-21: qty 2

## 2021-03-21 MED ORDER — SODIUM CHLORIDE 0.9% FLUSH
10.0000 mL | INTRAVENOUS | Status: DC | PRN
  Filled 2021-03-21: qty 10

## 2021-03-21 MED ORDER — SODIUM CHLORIDE 0.9 % IV SOLN
Freq: Once | INTRAVENOUS | Status: AC
  Filled 2021-03-21: qty 250

## 2021-03-21 MED ORDER — TRASTUZUMAB-ANNS CHEMO 150 MG IV SOLR
450.0000 mg | Freq: Once | INTRAVENOUS | Status: AC
  Administered 2021-03-21: 450 mg via INTRAVENOUS
  Filled 2021-03-21: qty 21.43

## 2021-03-21 MED ORDER — DIPHENHYDRAMINE HCL 25 MG PO CAPS
50.0000 mg | ORAL_CAPSULE | Freq: Once | ORAL | Status: AC
  Administered 2021-03-21: 50 mg via ORAL
  Filled 2021-03-21: qty 2

## 2021-03-21 MED ORDER — HEPARIN SOD (PORK) LOCK FLUSH 100 UNIT/ML IV SOLN
500.0000 [IU] | Freq: Once | INTRAVENOUS | Status: AC | PRN
  Administered 2021-03-21: 500 [IU]
  Filled 2021-03-21: qty 5

## 2021-03-21 MED ORDER — HEPARIN SOD (PORK) LOCK FLUSH 100 UNIT/ML IV SOLN
INTRAVENOUS | Status: AC
  Filled 2021-03-21: qty 5

## 2021-03-21 MED ORDER — SODIUM CHLORIDE 0.9 % IV SOLN
420.0000 mg | Freq: Once | INTRAVENOUS | Status: AC
  Administered 2021-03-21: 420 mg via INTRAVENOUS
  Filled 2021-03-21: qty 14

## 2021-03-21 NOTE — Progress Notes (Signed)
Hematology/Oncology Consult note [facility]  [phone] [phone]  Patient Care Team: Kirk Ruths, MD as PCP - General (Internal Medicine) Rico Junker, RN as Oncology Nurse Navigator   Name of the patient: Wendy Caldwell  [id number]  [birth year]   Date of visit: 03/21/21  Diagnosis- left breast cancer clinical prognostic stage IIA cT2 N0 M0 ER weakly positive, PR negative HER2 positive  Chief complaint/ Reason for visit-on treatment assessment prior to next cycle of adjuvant Herceptin and Perjeta dose 11  Heme/Onc history: Patient is a 66 year old female with a past medical history significant for psoriasis for which she is on Humira.  She self palpated a left breast mass which prompted a bilateral diagnostic mammogram on 06/25/2020.  That showed an irregular hypoechoic mass 2.5 x 1.7 x 1.8 cm at 1 o'clock position 1 cm from the nipple.  3 cm from the nipple were benign cysts.  1 mildly enlarged left axillary lymph node with cortical thickening 4 mm.  Both the breast mass and the lymph node were biopsied.  Left breast biopsy was consistent with invasive mammary carcinoma grade 3 ER weakly +1 to 10%, PR negative and HER2 positive by IHC.   Patient completed 4 cycles of neoadjuvant TCHP chemotherapy which was complicated by worsening skin rash and fatigue.  Patient did not wish to proceed with 2 further cycles of chemotherapy and proceeded with lumpectomy and sentinel lymph node biopsy.  Final pathology showed complete pathological response on 10/26/2020.  2 sentinel lymph nodes negative for malignancy.   Patient is completed adjuvant radiation treatment and is currently on adjuvant Herceptin and Perjeta.  She does not wish to take endocrine therapy for weakly ER positive tumor  Interval history-tolerating treatment well so far.  She does have loose stools which is her new normal bowel movements which can vary from 1-2 times a day.   Denies skin rash or other complaints  ECOG PS- 1 Pain scale- 0   Review of systems- Review of Systems  Constitutional:  Negative for chills, fever, malaise/fatigue and weight loss.  HENT:  Negative for congestion, ear discharge and nosebleeds.   Eyes:  Negative for blurred vision.  Respiratory:  Negative for cough, hemoptysis, sputum production, shortness of breath and wheezing.   Cardiovascular:  Negative for chest pain, palpitations, orthopnea and claudication.  Gastrointestinal:  Negative for abdominal pain, blood in stool, constipation, diarrhea, heartburn, melena, nausea and vomiting.  Genitourinary:  Negative for dysuria, flank pain, frequency, hematuria and urgency.  Musculoskeletal:  Negative for back pain, joint pain and myalgias.  Skin:  Negative for rash.  Neurological:  Negative for dizziness, tingling, focal weakness, seizures, weakness and headaches.  Endo/Heme/Allergies:  Does not bruise/bleed easily.  Psychiatric/Behavioral:  Negative for depression and suicidal ideas. The patient does not have insomnia.     No Known Allergies   Past Medical History:  Diagnosis Date   Family history of adverse reaction to anesthesia    adopted   GERD (gastroesophageal reflux disease)      Past Surgical History:  Procedure Laterality Date   BREAST BIOPSY Left 07/05/2020   [place] Bx, Q clip, [facility]   BREAST BIOPSY Left 07/05/2020   [place] Bx, [facility], benign breast tissue with increased stromal fibrosis   CARPAL TUNNEL RELEASE Bilateral    CHOLECYSTECTOMY     GANGLION CYST EXCISION     wrist   PART MASTECTOMY,RADIO FREQUENCY LOCALIZER,AXILLARY SENTINEL NODE BIOPSY Left 10/26/2020   Procedure: PART  FREQUENCY LOCALIZER,AXILLARY SENTINEL NODE BIOPSY;  Surgeon: Cintron-Diaz, Edgardo, MD;  Location: [facility] ORS;  Service: General;  Laterality: Left;  ° PORTACATH PLACEMENT Right 07/21/2020  ° Procedure: INSERTION PORT-A-CATH;  Surgeon: Cintron-Diaz, Edgardo, MD;  Location: [facility] ORS;   Service: General;  Laterality: Right;  ° right ovary removal    ° ° °Social History  ° °Socioeconomic History  ° Marital status: Married  °  Spouse name: Not on file  ° Number of children: Not on file  ° Years of education: Not on file  ° Highest education level: Not on file  °Occupational History  ° Not on file  °Tobacco Use  ° Smoking status: Former  °  Types: Cigarettes  °  Start date: 2012  ° Smokeless tobacco: Never  °Vaping Use  ° Vaping Use: Never used  °Substance and Sexual Activity  ° Alcohol use: Yes  °  Comment: occassional   ° Drug use: Never  ° Sexual activity: Not on file  °Other Topics Concern  ° Not on file  °Social History Narrative  ° Not on file  ° °Social Determinants of Health  ° °Financial Resource Strain: Not on file  °Food Insecurity: Not on file  °Transportation Needs: Not on file  °Physical Activity: Not on file  °Stress: Not on file  °Social Connections: Not on file  °Intimate Partner Violence: Not on file  ° ° °Family History  °Adopted: Yes  °Problem Relation Age of Onset  ° Breast cancer Neg Hx   ° ° ° °Current Outpatient Medications:  °  benzonatate (TESSALON) 100 MG capsule, Take 100 mg by mouth daily as needed for cough., Disp: , Rfl:  °  Multiple Vitamins-Minerals (EYE-VITE PLUS LUTEIN PO), Take 1 capsule by mouth 2 (two) times daily. AREDS2, Disp: , Rfl:  °  Multiple Vitamins-Minerals (MULTIVITAMIN WITH MINERALS) tablet, Take 1 tablet by mouth daily., Disp: , Rfl:  °  omeprazole (PRILOSEC) 40 MG capsule, Take 1 capsule (40 mg total) by mouth daily., Disp: 30 capsule, Rfl: 0 °  Polyethylene Glycol 400 (BLINK TEARS) 0.25 % SOLN, Apply 1 drop to eye 5 (five) times daily as needed. Every 2 hrs daily, Disp: , Rfl:  °  vitamin B-12 (CYANOCOBALAMIN) 1000 MCG tablet, Take 1,000 mcg by mouth daily., Disp: , Rfl:  °  vitamin C (ASCORBIC ACID) 500 MG tablet, Take 500 mg by mouth daily., Disp: , Rfl:  °  vitamin E 180 MG (400 UNITS) capsule, Take 400 Units by mouth daily., Disp: , Rfl:  °   loratadine-pseudoephedrine (CLARITIN-D 24 HOUR) 10-240 MG 24 hr tablet, Take 1 tablet by mouth daily. (Patient not taking: Reported on 10/29/2020), Disp: 30 tablet, Rfl: 0 °  ondansetron (ZOFRAN-ODT) 4 MG disintegrating tablet, Take 4 mg by mouth daily as needed for nausea/vomiting. (Patient not taking: Reported on 10/29/2020), Disp: , Rfl:  °  predniSONE (DELTASONE) 10 MG tablet, Take 1 tablet (10 mg total) by mouth daily with breakfast. Start with 5 tablets daily x 3 days , then 4 tablets x 3 day, then 3 tablets x 3 days, then 2 tablets x 3 days and then 1 tablet x 3 days (Patient not taking: Reported on 10/29/2020), Disp: 45 tablet, Rfl: 0 °  triamcinolone ointment (KENALOG) 0.5 %, Apply 1 application topically 2 (two) times daily. (Patient not taking: Reported on 10/29/2020), Disp: 30 g, Rfl: 0 °No current facility-administered medications for this visit. ° °Facility-Administered Medications Ordered in Other Visits:  °  heparin lock flush 100   100 UNIT/ML injection, , , ,    sodium chloride flush (NS) 0.9 % injection 10 mL, 10 mL, Intracatheter, PRN, Sindy Guadeloupe, MD  Physical exam:  Vitals:   03/21/21 1006  BP: 110/72  Pulse: 80  Resp: 16  Temp: (!) 97.2 F (36.2 C)  TempSrc: Tympanic  SpO2: 98%  Weight: 179 lb 12.8 oz (81.6 kg)  Height: 5' 4" (1.626 m)   Physical Exam Constitutional:      General: She is not in acute distress. Cardiovascular:     Rate and Rhythm: Normal rate and regular rhythm.     Heart sounds: Normal heart sounds.  Pulmonary:     Effort: Pulmonary effort is normal.  Skin:    General: Skin is warm and dry.  Neurological:     Mental Status: She is alert and oriented to person, place, and time.    Breast exam was performed in seated and lying down position. Patient is status post left lumpectomy with a well-healed surgical scar. No evidence of any palpable masses. No evidence of axillary adenopathy. No evidence of any palpable masses or lumps in the right breast. No  evidence of right axillary adenopathy    CMP Latest Ref Rng & Units 02/28/2021  Glucose 70 - 99 mg/dL 106(H)  BUN 8 - 23 mg/dL 21  Creatinine 0.44 - 1.00 mg/dL 0.78  Sodium 135 - 145 mmol/L 136  Potassium 3.5 - 5.1 mmol/L 3.7  Chloride 98 - 111 mmol/L 104  CO2 22 - 32 mmol/L 24  Calcium 8.9 - 10.3 mg/dL 8.9  Total Protein 6.5 - 8.1 g/dL 6.8  Total Bilirubin 0.3 - 1.2 mg/dL 0.8  Alkaline Phos 38 - 126 U/L 95  AST 15 - 41 U/L 22  ALT 0 - 44 U/L 23   CBC Latest Ref Rng & Units 02/28/2021  WBC 4.0 - 10.5 K/uL 6.9  Hemoglobin 12.0 - 15.0 g/dL 11.8(L)  Hematocrit 36.0 - 46.0 % 37.3  Platelets 150 - 400 K/uL 280    No images are attached to the encounter.  ECHOCARDIOGRAM COMPLETE  Result Date: 03/15/2021    ECHOCARDIOGRAM REPORT   Patient Name:   KALYANI MAEDA [facility] Date of Exam: 03/15/2021 Medical Rec #:  [id number]          Height:       64.0 in Accession #:    [id number]         Weight:       177.4 lb Date of Birth:  June 24, 1955         BSA:          1.859 m Patient Age:    54 years           BP:           110/58 mmHg Patient Gender: F                  HR:           79 bpm. Exam Location:  [facility] Procedure: 2D Echo, Cardiac Doppler, Color Doppler and Strain Analysis Indications:     C50.412 , Z17.0                  History of breast cancer.  History:         Patient has prior history of Echocardiogram examinations, most                  recent 10/18/2020. GERD.  Sonographer:  Jerry Hege Referring Phys:  [id number] ARCHANA C RAO Diagnosing Phys: Christopher End MD  Sonographer Comments: Global longitudinal strain was attempted. IMPRESSIONS  1. Left ventricular ejection fraction, by estimation, is 60 to 65%. The left ventricle has normal function. The left ventricle has no regional wall motion abnormalities. Left ventricular diastolic parameters were normal. The average left ventricular global longitudinal strain is -16.4 %. The global longitudinal strain is normal.  2. Right ventricular systolic  function is normal. The right ventricular size is normal. There is normal pulmonary artery systolic pressure.  3. The mitral valve is abnormal. Mild to moderate mitral valve regurgitation. No evidence of mitral stenosis. 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  4. The aortic valve has an indeterminant number of cusps. Aortic valve regurgitation is not visualized. No aortic stenosis is present.  5. The inferior vena cava is normal in size with greater than 50% respiratory variability, suggesting right atrial pressure of 3 mmHg. FINDINGS  Left Ventricle: Left ventricular ejection fraction, by estimation, is 60 to 65%. The left ventricle has normal function. The left ventricle has no regional wall motion abnormalities. The average left ventricular global longitudinal strain is -16.4 %. The global longitudinal strain is normal. The left ventricular internal cavity size was normal in size. There is borderline left ventricular hypertrophy. Left ventricular diastolic parameters were normal. Right Ventricle: The right ventricular size is normal. No increase in right ventricular wall thickness. Right ventricular systolic function is normal. There is normal pulmonary artery systolic pressure. The tricuspid regurgitant velocity is 2.51 m/s, and  with an assumed right atrial pressure of 3 mmHg, the estimated right ventricular systolic pressure is 28.2 mmHg. Left Atrium: Left atrial size was normal in size. Right Atrium: Right atrial size was normal in size. Pericardium: There is no evidence of pericardial effusion. Mitral Valve: The mitral valve is abnormal. 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 Mild to moderate mitral valve regurgitation. No evidence of mitral valve stenosis. MV peak gradient, 4.5 mmHg. The mean mitral valve gradient is 2.0 mmHg. Tricuspid Valve: The tricuspid valve is not well visualized. Tricuspid valve regurgitation is trivial.  Aortic Valve: The aortic valve has an indeterminant number of cusps. Aortic valve regurgitation is not visualized. No aortic stenosis is present. Aortic valve mean gradient measures 3.0 mmHg. Aortic valve peak gradient measures 6.0 mmHg. Aortic valve area, by VTI measures 3.11 cm². Pulmonic Valve: The pulmonic valve was not well visualized. Pulmonic valve regurgitation is not visualized. No evidence of pulmonic stenosis. Aorta: The aortic root is normal in size and structure. Pulmonary Artery: The pulmonary artery is not well seen. Venous: The inferior vena cava is normal in size with greater than 50% respiratory variability, suggesting right atrial pressure of 3 mmHg. IAS/Shunts: The interatrial septum was not well visualized.  LEFT VENTRICLE PLAX 2D LVIDd:         4.10 cm   Diastology LVIDs:         2.70 cm   LV e' medial:    8.16 cm/s LV PW:         0.80 cm   LV E/e' medial:  10.0 LV IVS:        0.85 cm   LV e' lateral:   9.57 cm/s LVOT diam:     2.00 cm   LV E/e' lateral: 8.5 LV SV:         74 LV SV Index:     40        2D Longitudinal Strain LVOT Area:     3.14 cm  2D Strain GLS Avg:     -16.4 %                           3D Volume EF:                          3D EF:        62 %                          LV EDV:       110 ml                          LV ESV:       42 ml                          LV SV:        68 ml RIGHT VENTRICLE RV Basal diam:  3.20 cm RV S prime:     14.80 cm/s TAPSE (M-mode): 4.3 cm LEFT ATRIUM             Index        RIGHT ATRIUM           Index LA diam:        3.30 cm 1.78 cm/m   RA Area:     15.80 cm LA Vol (A2C):   34.2 ml 18.40 ml/m  RA Volume:   43.60 ml  23.45 ml/m LA Vol (A4C):   40.1 ml 21.57 ml/m LA Biplane Vol: 39.4 ml 21.19 ml/m  AORTIC VALVE                    PULMONIC VALVE AV Area (Vmax):    2.78 cm     PV Vmax:        0.82 m/s AV Area (Vmean):   2.91 cm     PV Vmean:       52.700 cm/s AV Area (VTI):     3.11 cm     PV VTI:         0.121 m AV Vmax:           122.00 cm/s   PV Peak grad:   2.7 mmHg AV Vmean:          84.000 cm/s  PV Mean grad:   1.0 mmHg AV VTI:            0.236 m      RVOT Peak grad: 3 mmHg AV Peak Grad:      6.0 mmHg AV Mean Grad:      3.0 mmHg LVOT Vmax:         108.00 cm/s LVOT Vmean:        77.800 cm/s LVOT VTI:          0.234 m LVOT/AV VTI ratio: 0.99  AORTA Ao Root diam: 2.70 cm MITRAL VALVE               TRICUSPID VALVE MV Area (PHT): 3.91 cm    TR Peak grad:   25.2 mmHg MV Area VTI:   3.77 cm    TR Vmax:        251.00 cm/s MV Peak  grad:  4.5 mmHg MV Mean grad:  2.0 mmHg    SHUNTS MV Vmax:       1.06 m/s    Systemic VTI:  0.23 m MV Vmean:      58.6 cm/s   Systemic Diam: 2.00 cm MV Decel Time: 194 msec    Pulmonic VTI:  0.166 m MV E velocity: 81.80 cm/s MV A velocity: 60.40 cm/s MV E/A ratio:  1.35 Christopher End MD Electronically signed by Christopher End MD Signature Date/Time: 03/15/2021/12:55:41 PM    Final    ° ° °Assessment and plan- Patient is a 65 y.o. female  with clinical prognostic stage IIa T2 N0 M0 grade 3 invasive mammary carcinoma ER weakly positive, PR negative and HER2 positive.  She received 4 cycles of neoadjuvant TCHP chemotherapy with complete pathological response.  She is here for on treatment assessment prior to cycle 7 of adjuvant Herceptin and Perjeta ° °Counts okay to proceed with cycle 7 of Herceptin and Perjeta today.  She will directly proceed for cycle 8 in 3 weeks and I will see her back in 6 weeks for cycle 9.  I have reviewed her recent echocardiogram which shows a normal EF.  Plan is to complete 1 year of treatment ending sometime in May 2023.  ° °Breast exam was performed today and there is evidence of chronic lumpectomy scarring but no other palpable masses.  She would beDue for routine mammogram in April 2022 which will be coordinated by Dr. Cintron. °  °Visit Diagnosis °1. Encounter for monoclonal antibody treatment for malignancy   °2. Malignant neoplasm of upper-outer quadrant of left breast in female, estrogen receptor  positive (HCC)   ° ° ° °Dr. Archana Rao, MD, MPH °[facility] °[phone] °03/21/2021 °2:12 PM ° ° ° ° ° ° °    ° ° ° ° ° °

## 2021-03-21 NOTE — Progress Notes (Signed)
Pt tolerated all infusions well today with no problems or complaints.  Pt declined 30 minute observation post perjeta today.  Pt left infusion suite stable and ambulatory.

## 2021-03-21 NOTE — Patient Instructions (Signed)
[facility] [place]  Discharge Instructions: Thank you for choosing [facility] to provide your oncology and hematology care.   If you have a lab appointment with the [facility], please go directly to the [facility] and check in at the registration area.   Wear comfortable clothing and clothing appropriate for easy access to any Portacath or PICC line.   We strive to give you quality time with your provider. You may need to reschedule your appointment if you arrive late (15 or more minutes).  Arriving late affects you and other patients whose appointments are after yours.  Also, if you miss three or more appointments without notifying the office, you may be dismissed from the clinic at the providers discretion.      For prescription refill requests, have your pharmacy contact our office and allow 72 hours for refills to be completed.    Today you received the following chemotherapy and/or immunotherapy agents herceptin, perjeta      To help prevent nausea and vomiting after your treatment, we encourage you to take your nausea medication as directed.  BELOW ARE SYMPTOMS THAT SHOULD BE REPORTED IMMEDIATELY: *FEVER GREATER THAN 100.4 F (38 C) OR HIGHER *CHILLS OR SWEATING *NAUSEA AND VOMITING THAT IS NOT CONTROLLED WITH YOUR NAUSEA MEDICATION *UNUSUAL SHORTNESS OF BREATH *UNUSUAL BRUISING OR BLEEDING *URINARY PROBLEMS (pain or burning when urinating, or frequent urination) *BOWEL PROBLEMS (unusual diarrhea, constipation, pain near the anus) TENDERNESS IN MOUTH AND THROAT WITH OR WITHOUT PRESENCE OF ULCERS (sore throat, sores in mouth, or a toothache) UNUSUAL RASH, SWELLING OR PAIN  UNUSUAL VAGINAL DISCHARGE OR ITCHING   Items with * indicate a potential emergency and should be followed up as soon as possible or go to the Emergency Department if any problems should occur.  Please show the CHEMOTHERAPY ALERT CARD or IMMUNOTHERAPY ALERT CARD at  check-in to the Emergency Department and triage nurse.  Should you have questions after your visit or need to cancel or reschedule your appointment, please contact [facility]  Dept: [phone]  and follow the prompts.  Office hours are 8:00 a.m. to 4:30 p.m. Monday - Friday. Please note that voicemails left after 4:00 p.m. may not be returned until the following business day.  We are closed weekends and major holidays. You have access to a nurse at all times for urgent questions. Please call the main number to the clinic Dept: [phone] and follow the prompts.   For any non-urgent questions, you may also contact your provider using MyChart. We now offer e-Visits for anyone 64 and older to request care online for non-urgent symptoms. For details visit [email or web address].   Also download the MyChart app! Go to the app store, search "MyChart", open the app, select [facility], and log in with your MyChart username and password.  Due to Covid, a mask is required upon entering the hospital/clinic. If you do not have a mask, one will be given to you upon arrival. For doctor visits, patients may have 1 support person aged 64 or older with them. For treatment visits, patients cannot have anyone with them due to current Covid guidelines and our immunocompromised population.   Pertuzumab injection What is this medication? PERTUZUMAB (per TOOZ ue mab) is a monoclonal antibody. It is used to treat breast cancer. This medicine may be used for other purposes; ask your health care provider or pharmacist if you have questions. COMMON BRAND NAME(S): PERJETA What should I  tell my care team before I take this medication? They need to know if you have any of these conditions: heart disease heart failure high blood pressure history of irregular heart beat recent or ongoing radiation therapy an unusual or allergic reaction to pertuzumab, other medicines, foods, dyes, or  preservatives pregnant or trying to get pregnant breast-feeding How should I use this medication? This medicine is for infusion into a vein. It is given by a health care professional in a hospital or clinic setting. Talk to your pediatrician regarding the use of this medicine in children. Special care may be needed. Overdosage: If you think you have taken too much of this medicine contact a poison control center or emergency room at once. NOTE: This medicine is only for you. Do not share this medicine with others. What if I miss a dose? It is important not to miss your dose. Call your doctor or health care professional if you are unable to keep an appointment. What may interact with this medication? Interactions are not expected. Give your health care provider a list of all the medicines, herbs, non-prescription drugs, or dietary supplements you use. Also tell them if you smoke, drink alcohol, or use illegal drugs. Some items may interact with your medicine. This list may not describe all possible interactions. Give your health care provider a list of all the medicines, herbs, non-prescription drugs, or dietary supplements you use. Also tell them if you smoke, drink alcohol, or use illegal drugs. Some items may interact with your medicine. What should I watch for while using this medication? Your condition will be monitored carefully while you are receiving this medicine. Report any side effects. Continue your course of treatment even though you feel ill unless your doctor tells you to stop. Do not become pregnant while taking this medicine or for 7 months after stopping it. Women should inform their doctor if they wish to become pregnant or think they might be pregnant. Women of child-bearing potential will need to have a negative pregnancy test before starting this medicine. There is a potential for serious side effects to an unborn child. Talk to your health care professional or pharmacist for  more information. Do not breast-feed an infant while taking this medicine or for 7 months after stopping it. Women must use effective birth control with this medicine. Call your doctor or health care professional for advice if you get a fever, chills or sore throat, or other symptoms of a cold or flu. Do not treat yourself. Try to avoid being around people who are sick. You may experience fever, chills, and headache during the infusion. Report any side effects during the infusion to your health care professional. What side effects may I notice from receiving this medication? Side effects that you should report to your doctor or health care professional as soon as possible: breathing problems chest pain or palpitations dizziness feeling faint or lightheaded fever or chills skin rash, itching or hives sore throat swelling of the face, lips, or tongue swelling of the legs or ankles unusually weak or tired Side effects that usually do not require medical attention (report to your doctor or health care professional if they continue or are bothersome): diarrhea hair loss nausea, vomiting tiredness This list may not describe all possible side effects. Call your doctor for medical advice about side effects. You may report side effects to FDA at 1-800-FDA-1088. Where should I keep my medication? This drug is given in a hospital or clinic and will  not be stored at home. NOTE: This sheet is a summary. It may not cover all possible information. If you have questions about this medicine, talk to your doctor, pharmacist, or health care provider.  2022 Elsevier/Gold Standard (2015-04-01 00:00:00)   Trastuzumab injection for infusion What is this medication? TRASTUZUMAB (tras TOO zoo mab) is a monoclonal antibody. It is used to treat breast cancer and stomach cancer. This medicine may be used for other purposes; ask your health care provider or pharmacist if you have questions. COMMON BRAND NAME(S):  Herceptin, Janae Bridgeman, Ontruzant, Trazimera What should I tell my care team before I take this medication? They need to know if you have any of these conditions: heart disease heart failure lung or breathing disease, like asthma an unusual or allergic reaction to trastuzumab, benzyl alcohol, or other medications, foods, dyes, or preservatives pregnant or trying to get pregnant breast-feeding How should I use this medication? This drug is given as an infusion into a vein. It is administered in a hospital or clinic by a specially trained health care professional. Talk to your pediatrician regarding the use of this medicine in children. This medicine is not approved for use in children. Overdosage: If you think you have taken too much of this medicine contact a poison control center or emergency room at once. NOTE: This medicine is only for you. Do not share this medicine with others. What if I miss a dose? It is important not to miss a dose. Call your doctor or health care professional if you are unable to keep an appointment. What may interact with this medication? This medicine may interact with the following medications: certain types of chemotherapy, such as daunorubicin, doxorubicin, epirubicin, and idarubicin This list may not describe all possible interactions. Give your health care provider a list of all the medicines, herbs, non-prescription drugs, or dietary supplements you use. Also tell them if you smoke, drink alcohol, or use illegal drugs. Some items may interact with your medicine. What should I watch for while using this medication? Visit your doctor for checks on your progress. Report any side effects. Continue your course of treatment even though you feel ill unless your doctor tells you to stop. Call your doctor or health care professional for advice if you get a fever, chills or sore throat, or other symptoms of a cold or flu. Do not treat yourself. Try to avoid  being around people who are sick. You may experience fever, chills and shaking during your first infusion. These effects are usually mild and can be treated with other medicines. Report any side effects during the infusion to your health care professional. Fever and chills usually do not happen with later infusions. Do not become pregnant while taking this medicine or for 7 months after stopping it. Women should inform their doctor if they wish to become pregnant or think they might be pregnant. Women of child-bearing potential will need to have a negative pregnancy test before starting this medicine. There is a potential for serious side effects to an unborn child. Talk to your health care professional or pharmacist for more information. Do not breast-feed an infant while taking this medicine or for 7 months after stopping it. Women must use effective birth control with this medicine. What side effects may I notice from receiving this medication? Side effects that you should report to your doctor or health care professional as soon as possible: allergic reactions like skin rash, itching or hives, swelling of the face,  lips, or tongue chest pain or palpitations cough dizziness feeling faint or lightheaded, falls fever general ill feeling or flu-like symptoms signs of worsening heart failure like breathing problems; swelling in your legs and feet unusually weak or tired Side effects that usually do not require medical attention (report to your doctor or health care professional if they continue or are bothersome): bone pain changes in taste diarrhea joint pain nausea/vomiting weight loss This list may not describe all possible side effects. Call your doctor for medical advice about side effects. You may report side effects to FDA at 1-800-FDA-1088. Where should I keep my medication? This drug is given in a hospital or clinic and will not be stored at home. NOTE: This sheet is a summary. It  may not cover all possible information. If you have questions about this medicine, talk to your doctor, pharmacist, or health care provider.  2022 Elsevier/Gold Standard (2016-03-14 00:00:00)

## 2021-03-22 DIAGNOSIS — H04123 Dry eye syndrome of bilateral lacrimal glands: Secondary | ICD-10-CM | POA: Diagnosis not present

## 2021-03-25 ENCOUNTER — Encounter: Payer: Self-pay | Admitting: Oncology

## 2021-03-25 ENCOUNTER — Other Ambulatory Visit: Payer: Self-pay

## 2021-03-25 DIAGNOSIS — C50412 Malignant neoplasm of upper-outer quadrant of left female breast: Secondary | ICD-10-CM

## 2021-03-25 NOTE — Telephone Encounter (Signed)
yes

## 2021-04-04 ENCOUNTER — Telehealth: Payer: Self-pay | Admitting: Oncology

## 2021-04-04 NOTE — Telephone Encounter (Signed)
Pt called to reschedule her appt for 2-24.she will be out of town. Call back at [phone]

## 2021-04-05 ENCOUNTER — Encounter: Payer: Self-pay | Admitting: Oncology

## 2021-04-05 ENCOUNTER — Other Ambulatory Visit: Payer: Self-pay

## 2021-04-05 MED ORDER — AZELASTINE HCL 0.1 % NA SOLN: 2.0000 | Freq: Two times a day (BID) | NASAL | 2 refills | Status: DC

## 2021-04-05 NOTE — Telephone Encounter (Signed)
Hey! I had to secure chat Wendy Caldwell for confirmation. Patient had requested to come in 3 days earlier due to being out of town for her next treatment on 2/24. Per MD recommendation, treatment must be pushed out rather than having that early and she can be added on when she returns from her trip. Let patient know about MD recommendation and updated appointment and she responded via Mychart that she was agreeable to the plan.

## 2021-04-11 ENCOUNTER — Ambulatory Visit: Payer: BC Managed Care – PPO

## 2021-04-15 ENCOUNTER — Inpatient Hospital Stay: Payer: BC Managed Care – PPO | Attending: Oncology

## 2021-04-15 ENCOUNTER — Other Ambulatory Visit: Payer: Self-pay | Admitting: Oncology

## 2021-04-15 ENCOUNTER — Other Ambulatory Visit: Payer: Self-pay

## 2021-04-15 VITALS — BP 117/80 | HR 77 | Temp 96.8°F | Resp 18 | Wt 175.4 lb

## 2021-04-15 DIAGNOSIS — C50412 Malignant neoplasm of upper-outer quadrant of left female breast: Secondary | ICD-10-CM | POA: Diagnosis not present

## 2021-04-15 DIAGNOSIS — Z170 Estrogen receptor positive status [ER+]: Secondary | ICD-10-CM | POA: Insufficient documentation

## 2021-04-15 DIAGNOSIS — Z79899 Other long term (current) drug therapy: Secondary | ICD-10-CM | POA: Insufficient documentation

## 2021-04-15 DIAGNOSIS — Z5112 Encounter for antineoplastic immunotherapy: Secondary | ICD-10-CM | POA: Diagnosis not present

## 2021-04-15 MED ORDER — DIPHENHYDRAMINE HCL 25 MG PO CAPS
50.0000 mg | ORAL_CAPSULE | Freq: Once | ORAL | Status: AC
  Administered 2021-04-15: 50 mg via ORAL
  Filled 2021-04-15: qty 2

## 2021-04-15 MED ORDER — SODIUM CHLORIDE 0.9 % IV SOLN
Freq: Once | INTRAVENOUS | Status: AC
  Filled 2021-04-15: qty 250

## 2021-04-15 MED ORDER — SODIUM CHLORIDE 0.9% FLUSH
10.0000 mL | INTRAVENOUS | Status: DC | PRN
  Administered 2021-04-15: 10 mL
  Filled 2021-04-15: qty 10

## 2021-04-15 MED ORDER — SODIUM CHLORIDE 0.9 % IV SOLN
420.0000 mg | Freq: Once | INTRAVENOUS | Status: AC
  Administered 2021-04-15: 420 mg via INTRAVENOUS
  Filled 2021-04-15: qty 14

## 2021-04-15 MED ORDER — TRASTUZUMAB-ANNS CHEMO 150 MG IV SOLR
450.0000 mg | Freq: Once | INTRAVENOUS | Status: AC
  Administered 2021-04-15: 450 mg via INTRAVENOUS
  Filled 2021-04-15: qty 21.43

## 2021-04-15 MED ORDER — HEPARIN SOD (PORK) LOCK FLUSH 100 UNIT/ML IV SOLN
500.0000 [IU] | Freq: Once | INTRAVENOUS | Status: AC | PRN
  Administered 2021-04-15: 500 [IU]
  Filled 2021-04-15: qty 5

## 2021-04-15 MED ORDER — ACETAMINOPHEN 325 MG PO TABS
650.0000 mg | ORAL_TABLET | Freq: Once | ORAL | Status: AC
  Administered 2021-04-15: 650 mg via ORAL
  Filled 2021-04-15: qty 2

## 2021-05-02 ENCOUNTER — Ambulatory Visit: Payer: BC Managed Care – PPO

## 2021-05-02 ENCOUNTER — Other Ambulatory Visit: Payer: BC Managed Care – PPO

## 2021-05-02 ENCOUNTER — Ambulatory Visit: Payer: BC Managed Care – PPO | Admitting: Oncology

## 2021-05-04 ENCOUNTER — Other Ambulatory Visit: Payer: BC Managed Care – PPO

## 2021-05-04 ENCOUNTER — Ambulatory Visit: Payer: BC Managed Care – PPO | Admitting: Oncology

## 2021-05-04 ENCOUNTER — Ambulatory Visit: Payer: BC Managed Care – PPO

## 2021-05-06 ENCOUNTER — Ambulatory Visit: Payer: BC Managed Care – PPO

## 2021-05-06 ENCOUNTER — Ambulatory Visit: Payer: BC Managed Care – PPO | Admitting: Oncology

## 2021-05-06 ENCOUNTER — Other Ambulatory Visit: Payer: BC Managed Care – PPO

## 2021-05-09 NOTE — Progress Notes (Signed)
Last Kanjinti given 2/3 - next appointment on 3/6.  Per MD - keep on current dose no reload.  ?

## 2021-05-11 ENCOUNTER — Other Ambulatory Visit: Payer: Self-pay | Admitting: General Surgery

## 2021-05-11 DIAGNOSIS — Z853 Personal history of malignant neoplasm of breast: Secondary | ICD-10-CM

## 2021-05-16 ENCOUNTER — Encounter: Payer: Self-pay | Admitting: Oncology

## 2021-05-16 ENCOUNTER — Inpatient Hospital Stay: Payer: BC Managed Care – PPO | Attending: Oncology

## 2021-05-16 ENCOUNTER — Inpatient Hospital Stay: Payer: BC Managed Care – PPO

## 2021-05-16 ENCOUNTER — Inpatient Hospital Stay (HOSPITAL_BASED_OUTPATIENT_CLINIC_OR_DEPARTMENT_OTHER): Payer: BC Managed Care – PPO | Admitting: Oncology

## 2021-05-16 ENCOUNTER — Other Ambulatory Visit: Payer: Self-pay

## 2021-05-16 VITALS — BP 118/82 | HR 87 | Resp 16 | Wt 179.6 lb

## 2021-05-16 DIAGNOSIS — Z79899 Other long term (current) drug therapy: Secondary | ICD-10-CM | POA: Diagnosis not present

## 2021-05-16 DIAGNOSIS — C50412 Malignant neoplasm of upper-outer quadrant of left female breast: Secondary | ICD-10-CM

## 2021-05-16 DIAGNOSIS — Z5112 Encounter for antineoplastic immunotherapy: Secondary | ICD-10-CM | POA: Diagnosis not present

## 2021-05-16 DIAGNOSIS — Z170 Estrogen receptor positive status [ER+]: Secondary | ICD-10-CM

## 2021-05-16 LAB — CBC WITH DIFFERENTIAL/PLATELET
Abs Immature Granulocytes: 0.03 K/uL (ref 0.00–0.07)
Basophils Absolute: 0.1 K/uL (ref 0.0–0.1)
Basophils Relative: 1 %
Eosinophils Absolute: 0.4 K/uL (ref 0.0–0.5)
Eosinophils Relative: 5 %
HCT: 38.8 % (ref 36.0–46.0)
Hemoglobin: 12.3 g/dL (ref 12.0–15.0)
Immature Granulocytes: 0 %
Lymphocytes Relative: 22 %
Lymphs Abs: 1.7 K/uL (ref 0.7–4.0)
MCH: 20.5 pg — ABNORMAL LOW (ref 26.0–34.0)
MCHC: 31.7 g/dL (ref 30.0–36.0)
MCV: 64.6 fL — ABNORMAL LOW (ref 80.0–100.0)
Monocytes Absolute: 0.7 K/uL (ref 0.1–1.0)
Monocytes Relative: 10 %
Neutro Abs: 4.7 K/uL (ref 1.7–7.7)
Neutrophils Relative %: 62 %
Platelets: 284 K/uL (ref 150–400)
RBC: 6.01 MIL/uL — ABNORMAL HIGH (ref 3.87–5.11)
RDW: 15.7 % — ABNORMAL HIGH (ref 11.5–15.5)
WBC: 7.5 K/uL (ref 4.0–10.5)
nRBC: 0 % (ref 0.0–0.2)

## 2021-05-16 LAB — COMPREHENSIVE METABOLIC PANEL WITH GFR
ALT: 24 U/L (ref 0–44)
AST: 25 U/L (ref 15–41)
Albumin: 3.8 g/dL (ref 3.5–5.0)
Alkaline Phosphatase: 102 U/L (ref 38–126)
Anion gap: 7 (ref 5–15)
BUN: 21 mg/dL (ref 8–23)
CO2: 25 mmol/L (ref 22–32)
Calcium: 8.9 mg/dL (ref 8.9–10.3)
Chloride: 104 mmol/L (ref 98–111)
Creatinine, Ser: 0.87 mg/dL (ref 0.44–1.00)
GFR, Estimated: >60 mL/min (ref >60)
Glucose, Bld: 112 mg/dL — ABNORMAL HIGH (ref 70–99)
Potassium: 3.9 mmol/L (ref 3.5–5.1)
Sodium: 136 mmol/L (ref 135–145)
Total Bilirubin: 0.2 mg/dL — ABNORMAL LOW (ref 0.3–1.2)
Total Protein: 7.2 g/dL (ref 6.5–8.1)

## 2021-05-16 LAB — LIPID PANEL
Cholesterol: 198 mg/dL (ref 0–200)
HDL: 51 mg/dL (ref >40)
LDL Cholesterol: 108 mg/dL — ABNORMAL HIGH (ref 0–99)
Total CHOL/HDL Ratio: 3.9 RATIO
Triglycerides: 197 mg/dL — ABNORMAL HIGH (ref <150)
VLDL: 39 mg/dL (ref 0–40)

## 2021-05-16 LAB — TRIGLYCERIDES: Triglycerides: 199 mg/dL — ABNORMAL HIGH (ref <150)

## 2021-05-16 MED ORDER — HEPARIN SOD (PORK) LOCK FLUSH 100 UNIT/ML IV SOLN
500.0000 [IU] | Freq: Once | INTRAVENOUS | Status: AC | PRN
  Administered 2021-05-16: 500 [IU]
  Filled 2021-05-16: qty 5

## 2021-05-16 MED ORDER — TRASTUZUMAB-ANNS CHEMO 150 MG IV SOLR
450.0000 mg | Freq: Once | INTRAVENOUS | Status: AC
  Administered 2021-05-16: 450 mg via INTRAVENOUS
  Filled 2021-05-16: qty 21.43

## 2021-05-16 MED ORDER — SODIUM CHLORIDE 0.9% FLUSH
10.0000 mL | INTRAVENOUS | Status: DC | PRN
  Filled 2021-05-16: qty 10

## 2021-05-16 MED ORDER — SODIUM CHLORIDE 0.9 % IV SOLN
420.0000 mg | Freq: Once | INTRAVENOUS | Status: AC
  Administered 2021-05-16: 420 mg via INTRAVENOUS
  Filled 2021-05-16: qty 14

## 2021-05-16 MED ORDER — HEPARIN SOD (PORK) LOCK FLUSH 100 UNIT/ML IV SOLN
INTRAVENOUS | Status: AC
  Filled 2021-05-16: qty 5

## 2021-05-16 MED ORDER — SODIUM CHLORIDE 0.9 % IV SOLN
Freq: Once | INTRAVENOUS | Status: AC
  Filled 2021-05-16: qty 250

## 2021-05-16 MED ORDER — DIPHENHYDRAMINE HCL 25 MG PO CAPS
50.0000 mg | ORAL_CAPSULE | Freq: Once | ORAL | Status: AC
  Administered 2021-05-16: 50 mg via ORAL
  Filled 2021-05-16: qty 2

## 2021-05-16 MED ORDER — ACETAMINOPHEN 325 MG PO TABS
650.0000 mg | ORAL_TABLET | Freq: Once | ORAL | Status: AC
  Administered 2021-05-16: 650 mg via ORAL
  Filled 2021-05-16: qty 2

## 2021-05-16 NOTE — Patient Instructions (Signed)
[facility] CANCER CTR AT [place]  Discharge Instructions: Thank you for choosing [facility] to provide your oncology and hematology care.  If you have a lab appointment with the [facility], please go directly to the [facility] and check in at the registration area.  Wear comfortable clothing and clothing appropriate for easy access to any Portacath or PICC line.   We strive to give you quality time with your provider. You may need to reschedule your appointment if you arrive late (15 or more minutes).  Arriving late affects you and other patients whose appointments are after yours.  Also, if you miss three or more appointments without notifying the office, you may be dismissed from the clinic at the providers discretion.      For prescription refill requests, have your pharmacy contact our office and allow 72 hours for refills to be completed.    Today you received the following chemotherapy and/or immunotherapy agents herceptin, perjeta      To help prevent nausea and vomiting after your treatment, we encourage you to take your nausea medication as directed.  BELOW ARE SYMPTOMS THAT SHOULD BE REPORTED IMMEDIATELY: *FEVER GREATER THAN 100.4 F (38 C) OR HIGHER *CHILLS OR SWEATING *NAUSEA AND VOMITING THAT IS NOT CONTROLLED WITH YOUR NAUSEA MEDICATION *UNUSUAL SHORTNESS OF BREATH *UNUSUAL BRUISING OR BLEEDING *URINARY PROBLEMS (pain or burning when urinating, or frequent urination) *BOWEL PROBLEMS (unusual diarrhea, constipation, pain near the anus) TENDERNESS IN MOUTH AND THROAT WITH OR WITHOUT PRESENCE OF ULCERS (sore throat, sores in mouth, or a toothache) UNUSUAL RASH, SWELLING OR PAIN  UNUSUAL VAGINAL DISCHARGE OR ITCHING   Items with * indicate a potential emergency and should be followed up as soon as possible or go to the Emergency Department if any problems should occur.  Please show the CHEMOTHERAPY ALERT CARD or IMMUNOTHERAPY ALERT CARD at  check-in to the Emergency Department and triage nurse.  Should you have questions after your visit or need to cancel or reschedule your appointment, please contact [facility] CANCER [place] AT [place]  [phone] and follow the prompts.  Office hours are 8:00 a.m. to 4:30 p.m. Monday - Friday. Please note that voicemails left after 4:00 p.m. may not be returned until the following business day.  We are closed weekends and major holidays. You have access to a nurse at all times for urgent questions. Please call the main number to the clinic [phone] and follow the prompts.  For any non-urgent questions, you may also contact your provider using MyChart. We now offer e-Visits for anyone 68 and older to request care online for non-urgent symptoms. For details visit [email or web address].   Also download the MyChart app! Go to the app store, search "MyChart", open the app, select [facility], and log in with your MyChart username and password.  Due to Covid, a mask is required upon entering the hospital/clinic. If you do not have a mask, one will be given to you upon arrival. For doctor visits, patients may have 1 support person aged 41 or older with them. For treatment visits, patients cannot have anyone with them due to current Covid guidelines and our immunocompromised population.   Trastuzumab injection for infusion What is this medication? TRASTUZUMAB (tras TOO zoo mab) is a monoclonal antibody. It is used to treat breast cancer and stomach cancer. This medicine may be used for other purposes; ask your health care provider or pharmacist if you have questions. COMMON BRAND NAME(S): Herceptin, Belenda Cruise, Ogivri, Ontruzant,  Trazimera What should I tell my care team before I take this medication? They need to know if you have any of these conditions: heart disease heart failure lung or breathing disease, like asthma an unusual or allergic reaction to trastuzumab, benzyl  alcohol, or other medications, foods, dyes, or preservatives pregnant or trying to get pregnant breast-feeding How should I use this medication? This drug is given as an infusion into a vein. It is administered in a hospital or clinic by a specially trained health care professional. Talk to your pediatrician regarding the use of this medicine in children. This medicine is not approved for use in children. Overdosage: If you think you have taken too much of this medicine contact a poison control center or emergency room at once. NOTE: This medicine is only for you. Do not share this medicine with others. What if I miss a dose? It is important not to miss a dose. Call your doctor or health care professional if you are unable to keep an appointment. What may interact with this medication? This medicine may interact with the following medications: certain types of chemotherapy, such as daunorubicin, doxorubicin, epirubicin, and idarubicin This list may not describe all possible interactions. Give your health care provider a list of all the medicines, herbs, non-prescription drugs, or dietary supplements you use. Also tell them if you smoke, drink alcohol, or use illegal drugs. Some items may interact with your medicine. What should I watch for while using this medication? Visit your doctor for checks on your progress. Report any side effects. Continue your course of treatment even though you feel ill unless your doctor tells you to stop. Call your doctor or health care professional for advice if you get a fever, chills or sore throat, or other symptoms of a cold or flu. Do not treat yourself. Try to avoid being around people who are sick. You may experience fever, chills and shaking during your first infusion. These effects are usually mild and can be treated with other medicines. Report any side effects during the infusion to your health care professional. Fever and chills usually do not happen with  later infusions. Do not become pregnant while taking this medicine or for 7 months after stopping it. Women should inform their doctor if they wish to become pregnant or think they might be pregnant. Women of child-bearing potential will need to have a negative pregnancy test before starting this medicine. There is a potential for serious side effects to an unborn child. Talk to your health care professional or pharmacist for more information. Do not breast-feed an infant while taking this medicine or for 7 months after stopping it. Women must use effective birth control with this medicine. What side effects may I notice from receiving this medication? Side effects that you should report to your doctor or health care professional as soon as possible: allergic reactions like skin rash, itching or hives, swelling of the face, lips, or tongue chest pain or palpitations cough dizziness feeling faint or lightheaded, falls fever general ill feeling or flu-like symptoms signs of worsening heart failure like breathing problems; swelling in your legs and feet unusually weak or tired Side effects that usually do not require medical attention (report to your doctor or health care professional if they continue or are bothersome): bone pain changes in taste diarrhea joint pain nausea/vomiting weight loss This list may not describe all possible side effects. Call your doctor for medical advice about side effects. You may report side effects  to FDA at 1-800-FDA-1088. Where should I keep my medication? This drug is given in a hospital or clinic and will not be stored at home. NOTE: This sheet is a summary. It may not cover all possible information. If you have questions about this medicine, talk to your doctor, pharmacist, or health care provider.  2022 Elsevier/Gold Standard (2016-03-14 00:00:00)  Pertuzumab injection What is this medication? PERTUZUMAB (per TOOZ ue mab) is a monoclonal antibody. It  is used to treat breast cancer. This medicine may be used for other purposes; ask your health care provider or pharmacist if you have questions. COMMON BRAND NAME(S): PERJETA What should I tell my care team before I take this medication? They need to know if you have any of these conditions: heart disease heart failure high blood pressure history of irregular heart beat recent or ongoing radiation therapy an unusual or allergic reaction to pertuzumab, other medicines, foods, dyes, or preservatives pregnant or trying to get pregnant breast-feeding How should I use this medication? This medicine is for infusion into a vein. It is given by a health care professional in a hospital or clinic setting. Talk to your pediatrician regarding the use of this medicine in children. Special care may be needed. Overdosage: If you think you have taken too much of this medicine contact a poison control center or emergency room at once. NOTE: This medicine is only for you. Do not share this medicine with others. What if I miss a dose? It is important not to miss your dose. Call your doctor or health care professional if you are unable to keep an appointment. What may interact with this medication? Interactions are not expected. Give your health care provider a list of all the medicines, herbs, non-prescription drugs, or dietary supplements you use. Also tell them if you smoke, drink alcohol, or use illegal drugs. Some items may interact with your medicine. This list may not describe all possible interactions. Give your health care provider a list of all the medicines, herbs, non-prescription drugs, or dietary supplements you use. Also tell them if you smoke, drink alcohol, or use illegal drugs. Some items may interact with your medicine. What should I watch for while using this medication? Your condition will be monitored carefully while you are receiving this medicine. Report any side effects. Continue your  course of treatment even though you feel ill unless your doctor tells you to stop. Do not become pregnant while taking this medicine or for 7 months after stopping it. Women should inform their doctor if they wish to become pregnant or think they might be pregnant. Women of child-bearing potential will need to have a negative pregnancy test before starting this medicine. There is a potential for serious side effects to an unborn child. Talk to your health care professional or pharmacist for more information. Do not breast-feed an infant while taking this medicine or for 7 months after stopping it. Women must use effective birth control with this medicine. Call your doctor or health care professional for advice if you get a fever, chills or sore throat, or other symptoms of a cold or flu. Do not treat yourself. Try to avoid being around people who are sick. You may experience fever, chills, and headache during the infusion. Report any side effects during the infusion to your health care professional. What side effects may I notice from receiving this medication? Side effects that you should report to your doctor or health care professional as soon as possible: breathing  problems chest pain or palpitations dizziness feeling faint or lightheaded fever or chills skin rash, itching or hives sore throat swelling of the face, lips, or tongue swelling of the legs or ankles unusually weak or tired Side effects that usually do not require medical attention (report to your doctor or health care professional if they continue or are bothersome): diarrhea hair loss nausea, vomiting tiredness This list may not describe all possible side effects. Call your doctor for medical advice about side effects. You may report side effects to FDA at 1-800-FDA-1088. Where should I keep my medication? This drug is given in a hospital or clinic and will not be stored at home. NOTE: This sheet is a summary. It may not  cover all possible information. If you have questions about this medicine, talk to your doctor, pharmacist, or health care provider.  2022 Elsevier/Gold Standard (2015-04-01 00:00:00)

## 2021-05-16 NOTE — Progress Notes (Signed)
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  She self palpated a left breast mass which prompted a bilateral diagnostic mammogram on 06/25/2020.  That showed an irregular hypoechoic mass 2.5 x 1.7 x 1.8 cm at 1 o'clock position 1 cm from the nipple.  3 cm from the nipple were benign cysts.  1 mildly enlarged left axillary lymph node with cortical thickening 4 mm.  Both the breast mass and the lymph node were biopsied.  Left breast biopsy was consistent with invasive mammary carcinoma grade 3 ER weakly +1 to 10%, PR negative and HER2 positive by IHC.   Patient completed 4 cycles of neoadjuvant TCHP chemotherapy which was complicated by worsening skin rash and fatigue.  Patient did not wish to proceed with 2 further cycles of chemotherapy and proceeded with lumpectomy and sentinel lymph node biopsy.  Final pathology showed complete pathological response on 10/26/2020.  2 sentinel lymph nodes negative for malignancy.   Patient is completed adjuvant radiation treatment and is currently on adjuvant Herceptin and Perjeta.  She does not wish to take endocrine therapy for weakly ER positive tumor    Interval history-patient reports having sharp stinging pain in her left breast about 3 to 4 days ago which lasted throughout the day.  It was better the following  day when it recurred only in the evening.  She has not had this kind of pain before.  ECOG PS- 0 Pain scale- 3  Review of systems- Review of Systems  Constitutional:  Negative for chills, fever, malaise/fatigue and weight loss.  HENT:  Negative for congestion, ear discharge and nosebleeds.   Eyes:  Negative for blurred vision.  Respiratory:  Negative for cough, hemoptysis, sputum production, shortness of breath and wheezing.   Cardiovascular:  Negative for chest pain, palpitations, orthopnea and claudication.  Gastrointestinal:  Negative for abdominal pain, blood in stool, constipation, diarrhea, heartburn, melena, nausea and vomiting.  Genitourinary:  Negative for dysuria, flank pain, frequency, hematuria and urgency.  Musculoskeletal:  Negative for back pain, joint pain and myalgias.  Skin:  Negative for rash.  Neurological:  Negative for dizziness, tingling, focal weakness, seizures, weakness and headaches.  Endo/Heme/Allergies:  Does not bruise/bleed easily.  Psychiatric/Behavioral:  Negative for depression and suicidal ideas. The patient does not have insomnia.      No Known Allergies   Past Medical History:  Diagnosis Date   Family history of adverse reaction to anesthesia    adopted   GERD (gastroesophageal reflux disease)      Past Surgical History:  Procedure Laterality Date   BREAST BIOPSY Left 07/05/2020   [place] Bx, Q clip, [facility]   BREAST BIOPSY Left 07/05/2020   [place] Bx, [facility], benign breast tissue with increased stromal fibrosis   CARPAL TUNNEL RELEASE Bilateral    CHOLECYSTECTOMY     GANGLION CYST EXCISION     wrist   PART MASTECTOMY,RADIO  FREQUENCY LOCALIZER,AXILLARY SENTINEL NODE BIOPSY Left 10/26/2020   Procedure: PART MASTECTOMY,RADIO FREQUENCY LOCALIZER,AXILLARY SENTINEL NODE BIOPSY;  Surgeon: Herbert Pun, MD;  Location: [facility] ORS;  Service: General;  Laterality: Left;   PORTACATH PLACEMENT Right 07/21/2020   Procedure: INSERTION PORT-A-CATH;  Surgeon:  Herbert Pun, MD;  Location: [facility] ORS;  Service: General;  Laterality: Right;   right ovary removal      Social History   Socioeconomic History   Marital status: Married    Spouse name: Not on file   Number of children: Not on file   Years of education: Not on file   Highest education level: Not on file  Occupational History   Not on file  Tobacco Use   Smoking status: Former    Types: Cigarettes    Start date: 2012   Smokeless tobacco: Never  Vaping Use   Vaping Use: Never used  Substance and Sexual Activity   Alcohol use: Yes    Comment: occassional    Drug use: Never   Sexual activity: Not on file  Other Topics Concern   Not on file  Social History Narrative   Not on file   Social Determinants of Health   Financial Resource Strain: Not on file  Food Insecurity: Not on file  Transportation Needs: Not on file  Physical Activity: Not on file  Stress: Not on file  Social Connections: Not on file  Intimate Partner Violence: Not on file    Family History  Adopted: Yes  Problem Relation Age of Onset   Breast cancer Neg Hx      Current Outpatient Medications:    azelastine (ASTELIN) 0.1 % nasal spray, Place 2 sprays into both nostrils 2 (two) times daily. Use in each nostril as directed, Disp: 30 mL, Rfl: 2   benzonatate (TESSALON) 100 MG capsule, Take 100 mg by mouth daily as needed for cough., Disp: , Rfl:    loratadine-pseudoephedrine (CLARITIN-D 24 HOUR) 10-240 MG 24 hr tablet, Take 1 tablet by mouth daily. (Patient not taking: Reported on 10/29/2020), Disp: 30 tablet, Rfl: 0   Multiple Vitamins-Minerals (EYE-VITE PLUS LUTEIN PO), Take 1 capsule by mouth 2 (two) times daily. AREDS2, Disp: , Rfl:    Multiple Vitamins-Minerals (MULTIVITAMIN WITH MINERALS) tablet, Take 1 tablet by mouth daily., Disp: , Rfl:    omeprazole (PRILOSEC) 40 MG capsule, Take 1 capsule (40 mg total) by mouth daily., Disp: 30 capsule, Rfl: 0   ondansetron (ZOFRAN-ODT) 4 MG  disintegrating tablet, Take 4 mg by mouth daily as needed for nausea/vomiting. (Patient not taking: Reported on 10/29/2020), Disp: , Rfl:    Polyethylene Glycol 400 (BLINK TEARS) 0.25 % SOLN, Apply 1 drop to eye 5 (five) times daily as needed. Every 2 hrs daily, Disp: , Rfl:    predniSONE (DELTASONE) 10 MG tablet, Take 1 tablet (10 mg total) by mouth daily with breakfast. Start with 5 tablets daily x 3 days , then 4 tablets x 3 day, then 3 tablets x 3 days, then 2 tablets x 3 days and then 1 tablet x 3 days (Patient not taking: Reported on 10/29/2020), Disp: 45 tablet, Rfl: 0   triamcinolone ointment (KENALOG) 0.5 %, Apply 1 application topically 2 (two) times daily. (Patient not taking: Reported on 10/29/2020), Disp: 30 g, Rfl: 0   vitamin B-12 (CYANOCOBALAMIN) 1000 MCG tablet, Take 1,000 mcg by mouth daily., Disp: , Rfl:    vitamin C (ASCORBIC ACID) 500 MG tablet, Take 500 mg by mouth daily., Disp: , Rfl:  vitamin E 180 MG (400 UNITS) capsule, Take 400 Units by mouth daily., Disp: , Rfl:  No current facility-administered medications for this visit.  Facility-Administered Medications Ordered in Other Visits:    heparin lock flush 100 UNIT/ML injection, , , ,    sodium chloride flush (NS) 0.9 % injection 10 mL, 10 mL, Intracatheter, PRN, Sindy Guadeloupe, MD  Physical exam:  Vitals:   05/16/21 1016  BP: 118/82  Pulse: 87  Resp: 16  Weight: 179 lb 9.6 oz (81.5 kg)   Physical Exam Constitutional:      General: She is not in acute distress. Cardiovascular:     Rate and Rhythm: Normal rate and regular rhythm.     Heart sounds: Normal heart sounds.  Pulmonary:     Effort: Pulmonary effort is normal.     Breath sounds: Normal breath sounds.  Abdominal:     General: Bowel sounds are normal.     Palpations: Abdomen is soft.  Skin:    General: Skin is warm and dry.  Neurological:     Mental Status: She is alert and oriented to person, place, and time.   Breast exam was performed in seated  and lying down position. Patient is status post left lumpectomy with a well-healed surgical scar. No evidence of any palpable masses.  I did not notice any skin changes in her left breast.  No evidence of axillary adenopathy. No evidence of any palpable masses or lumps in the right breast. No evidence of right axillary adenopathy   CMP Latest Ref Rng & Units 05/16/2021  Glucose 70 - 99 mg/dL 112(H)  BUN 8 - 23 mg/dL 21  Creatinine 0.44 - 1.00 mg/dL 0.87  Sodium 135 - 145 mmol/L 136  Potassium 3.5 - 5.1 mmol/L 3.9  Chloride 98 - 111 mmol/L 104  CO2 22 - 32 mmol/L 25  Calcium 8.9 - 10.3 mg/dL 8.9  Total Protein 6.5 - 8.1 g/dL 7.2  Total Bilirubin 0.3 - 1.2 mg/dL 0.2(L)  Alkaline Phos 38 - 126 U/L 102  AST 15 - 41 U/L 25  ALT 0 - 44 U/L 24   CBC Latest Ref Rng & Units 05/16/2021  WBC 4.0 - 10.5 K/uL 7.5  Hemoglobin 12.0 - 15.0 g/dL 12.3  Hematocrit 36.0 - 46.0 % 38.8  Platelets 150 - 400 K/uL 284     Assessment and plan- Patient is a 66 y.o. female with clinical prognostic stage IIa T2 N0 M0 grade 3 invasive mammary carcinoma ER weakly positive, PR negative and HER2 positive.  She received 4 cycles of neoadjuvant TCHP chemotherapy with complete pathological response.  She is here for on treatment assessment prior to cycle 9 of adjuvant Herceptin and Perjeta  Counts okay to proceed with cycle 9 of adjuvant Herceptin and Perjeta today.  She will directly proceed for cycle 10 in 3 weeks and I will see her back in 6 weeks for cycle 11.  Plan is to complete 13 cycles.  She will need an echocardiogram prior to her next visit with me.  Patient complains of left breast pain in the area of lumpectomy.  Did not notice any skin changes or palpable masses.  She will be due for a routine bilateral mammogram next month which is already scheduled on 06/28/2021 by Dr. Peyton Najjar.  However if patient continues to experience worsening pain I will consider getting additional imaging at this time.   Visit  Diagnosis 1. Malignant neoplasm of upper-outer quadrant of left breast in female,  estrogen receptor positive ([place])   2. Encounter for monoclonal antibody treatment for malignancy      Dr. Randa Evens, MD, MPH [facility] [address] at [facility] [phone] 05/16/2021 12:57 PM

## 2021-05-16 NOTE — Progress Notes (Signed)
Pt states she has a sharp shooting pain on and off. Noticed scar on her left breast, along with inflammation. Also states diarrhea has not stopped but does not stop her from doing daily routine  ?

## 2021-05-16 NOTE — Progress Notes (Signed)
Pt tolerated all infusions well today.  Pt left chemo suite stable and ambulatory.  ?

## 2021-05-17 ENCOUNTER — Other Ambulatory Visit: Payer: Self-pay | Admitting: Oncology

## 2021-05-17 NOTE — Progress Notes (Signed)
Dr. Janese Banks wanted pt lab results forwarded to pts PCP Dr. Ouida Sills; fax went through.  ?

## 2021-05-23 ENCOUNTER — Encounter: Payer: Self-pay | Admitting: Oncology

## 2021-05-23 ENCOUNTER — Ambulatory Visit: Payer: BC Managed Care – PPO

## 2021-05-23 DIAGNOSIS — Z0100 Encounter for examination of eyes and vision without abnormal findings: Secondary | ICD-10-CM | POA: Diagnosis not present

## 2021-05-23 DIAGNOSIS — H04123 Dry eye syndrome of bilateral lacrimal glands: Secondary | ICD-10-CM | POA: Diagnosis not present

## 2021-06-06 ENCOUNTER — Inpatient Hospital Stay: Payer: BC Managed Care – PPO

## 2021-06-06 ENCOUNTER — Other Ambulatory Visit: Payer: Self-pay

## 2021-06-06 VITALS — BP 117/56 | HR 79 | Temp 97.8°F | Resp 18 | Wt 179.0 lb

## 2021-06-06 DIAGNOSIS — Z170 Estrogen receptor positive status [ER+]: Secondary | ICD-10-CM | POA: Diagnosis not present

## 2021-06-06 DIAGNOSIS — Z5112 Encounter for antineoplastic immunotherapy: Secondary | ICD-10-CM | POA: Diagnosis not present

## 2021-06-06 DIAGNOSIS — Z79899 Other long term (current) drug therapy: Secondary | ICD-10-CM | POA: Diagnosis not present

## 2021-06-06 DIAGNOSIS — C50412 Malignant neoplasm of upper-outer quadrant of left female breast: Secondary | ICD-10-CM | POA: Diagnosis not present

## 2021-06-06 MED ORDER — SODIUM CHLORIDE 0.9% FLUSH
10.0000 mL | INTRAVENOUS | Status: DC | PRN
  Administered 2021-06-06: 10 mL
  Filled 2021-06-06: qty 10

## 2021-06-06 MED ORDER — DIPHENHYDRAMINE HCL 25 MG PO CAPS
50.0000 mg | ORAL_CAPSULE | Freq: Once | ORAL | Status: AC
  Administered 2021-06-06: 50 mg via ORAL
  Filled 2021-06-06: qty 2

## 2021-06-06 MED ORDER — SODIUM CHLORIDE 0.9 % IV SOLN
420.0000 mg | Freq: Once | INTRAVENOUS | Status: AC
  Administered 2021-06-06: 420 mg via INTRAVENOUS
  Filled 2021-06-06: qty 14

## 2021-06-06 MED ORDER — TRASTUZUMAB-ANNS CHEMO 150 MG IV SOLR
450.0000 mg | Freq: Once | INTRAVENOUS | Status: AC
  Administered 2021-06-06: 450 mg via INTRAVENOUS
  Filled 2021-06-06: qty 21.43

## 2021-06-06 MED ORDER — ACETAMINOPHEN 325 MG PO TABS
650.0000 mg | ORAL_TABLET | Freq: Once | ORAL | Status: AC
  Administered 2021-06-06: 650 mg via ORAL
  Filled 2021-06-06: qty 2

## 2021-06-06 MED ORDER — HEPARIN SOD (PORK) LOCK FLUSH 100 UNIT/ML IV SOLN
500.0000 [IU] | Freq: Once | INTRAVENOUS | Status: AC | PRN
  Administered 2021-06-06: 500 [IU]
  Filled 2021-06-06: qty 5

## 2021-06-06 MED ORDER — SODIUM CHLORIDE 0.9 % IV SOLN
Freq: Once | INTRAVENOUS | Status: AC
  Filled 2021-06-06: qty 250

## 2021-06-09 ENCOUNTER — Encounter: Payer: Self-pay | Admitting: Oncology

## 2021-06-13 ENCOUNTER — Encounter: Payer: Self-pay | Admitting: Licensed Clinical Social Worker

## 2021-06-21 DIAGNOSIS — M3501 Sicca syndrome with keratoconjunctivitis: Secondary | ICD-10-CM | POA: Diagnosis not present

## 2021-06-23 ENCOUNTER — Ambulatory Visit
Admission: RE | Admit: 2021-06-23 | Discharge: 2021-06-23 | Disposition: A | Discharge status: Home / Self Care | Payer: BC Managed Care – PPO | Source: Ambulatory Visit | Attending: Oncology | Admitting: Oncology

## 2021-06-23 DIAGNOSIS — K219 Gastro-esophageal reflux disease without esophagitis: Secondary | ICD-10-CM | POA: Diagnosis not present

## 2021-06-23 DIAGNOSIS — Z170 Estrogen receptor positive status [ER+]: Secondary | ICD-10-CM | POA: Diagnosis not present

## 2021-06-23 DIAGNOSIS — I5030 Unspecified diastolic (congestive) heart failure: Secondary | ICD-10-CM | POA: Diagnosis not present

## 2021-06-23 DIAGNOSIS — I081 Rheumatic disorders of both mitral and tricuspid valves: Secondary | ICD-10-CM

## 2021-06-23 DIAGNOSIS — I340 Nonrheumatic mitral (valve) insufficiency: Secondary | ICD-10-CM | POA: Insufficient documentation

## 2021-06-23 DIAGNOSIS — C50412 Malignant neoplasm of upper-outer quadrant of left female breast: Secondary | ICD-10-CM | POA: Diagnosis not present

## 2021-06-23 DIAGNOSIS — Z0189 Encounter for other specified special examinations: Secondary | ICD-10-CM

## 2021-06-23 LAB — ECHOCARDIOGRAM COMPLETE
AR max vel: 2.75 cm2
AV Area VTI: 3.07 cm2
AV Area mean vel: 2.82 cm2
AV Mean grad: 4.5 mmHg
AV Peak grad: 7.9 mmHg
Ao pk vel: 1.41 m/s
Area-P 1/2: 3.33 cm2
MV VTI: 4.13 cm2
S' Lateral: 2.6 cm

## 2021-06-23 NOTE — Progress Notes (Signed)
*  PRELIMINARY RESULTS* ?Echocardiogram ?2D Echocardiogram has been performed. ? ?Wendy Caldwell, [name] ?06/23/2021, 10:42 AM ?

## 2021-06-27 ENCOUNTER — Ambulatory Visit: Payer: BC Managed Care – PPO | Admitting: Radiation Oncology

## 2021-06-27 ENCOUNTER — Ambulatory Visit: Payer: BC Managed Care – PPO | Admitting: Oncology

## 2021-06-27 ENCOUNTER — Ambulatory Visit: Payer: BC Managed Care – PPO

## 2021-06-27 ENCOUNTER — Other Ambulatory Visit: Payer: BC Managed Care – PPO

## 2021-06-28 ENCOUNTER — Ambulatory Visit
Admission: RE | Admit: 2021-06-28 | Discharge: 2021-06-28 | Disposition: A | Discharge status: Home / Self Care | Payer: BC Managed Care – PPO | Source: Ambulatory Visit | Attending: General Surgery | Admitting: General Surgery

## 2021-06-28 DIAGNOSIS — Z853 Personal history of malignant neoplasm of breast: Secondary | ICD-10-CM

## 2021-06-28 DIAGNOSIS — R922 Inconclusive mammogram: Secondary | ICD-10-CM | POA: Diagnosis not present

## 2021-07-04 ENCOUNTER — Encounter: Payer: Self-pay | Admitting: Oncology

## 2021-07-04 ENCOUNTER — Inpatient Hospital Stay: Payer: BC Managed Care – PPO

## 2021-07-04 ENCOUNTER — Inpatient Hospital Stay: Payer: BC Managed Care – PPO | Attending: Oncology | Admitting: Oncology

## 2021-07-04 VITALS — BP 112/74 | HR 84 | Temp 96.7°F | Resp 18 | Wt 176.4 lb

## 2021-07-04 DIAGNOSIS — Z79899 Other long term (current) drug therapy: Secondary | ICD-10-CM | POA: Insufficient documentation

## 2021-07-04 DIAGNOSIS — Z170 Estrogen receptor positive status [ER+]: Secondary | ICD-10-CM

## 2021-07-04 DIAGNOSIS — Z5112 Encounter for antineoplastic immunotherapy: Secondary | ICD-10-CM | POA: Diagnosis not present

## 2021-07-04 DIAGNOSIS — C50412 Malignant neoplasm of upper-outer quadrant of left female breast: Secondary | ICD-10-CM | POA: Diagnosis not present

## 2021-07-04 LAB — CBC WITH DIFFERENTIAL/PLATELET
Abs Immature Granulocytes: 0.04 10*3/uL (ref 0.00–0.07)
Basophils Absolute: 0.1 10*3/uL (ref 0.0–0.1)
Basophils Relative: 1 %
Eosinophils Absolute: 0.3 10*3/uL (ref 0.0–0.5)
Eosinophils Relative: 4 %
HCT: 41.9 % (ref 36.0–46.0)
Hemoglobin: 13.2 g/dL (ref 12.0–15.0)
Immature Granulocytes: 1 %
Lymphocytes Relative: 23 %
Lymphs Abs: 1.7 10*3/uL (ref 0.7–4.0)
MCH: 20.2 pg — ABNORMAL LOW (ref 26.0–34.0)
MCHC: 31.5 g/dL (ref 30.0–36.0)
MCV: 64 fL — ABNORMAL LOW (ref 80.0–100.0)
Monocytes Absolute: 0.7 10*3/uL (ref 0.1–1.0)
Monocytes Relative: 9 %
Neutro Abs: 4.5 10*3/uL (ref 1.7–7.7)
Neutrophils Relative %: 62 %
Platelets: 315 10*3/uL (ref 150–400)
RBC: 6.55 MIL/uL — ABNORMAL HIGH (ref 3.87–5.11)
RDW: 15.5 % (ref 11.5–15.5)
WBC: 7.1 10*3/uL (ref 4.0–10.5)
nRBC: 0 % (ref 0.0–0.2)

## 2021-07-04 LAB — COMPREHENSIVE METABOLIC PANEL
ALT: 38 U/L (ref 0–44)
AST: 24 U/L (ref 15–41)
Albumin: 3.9 g/dL (ref 3.5–5.0)
Alkaline Phosphatase: 128 U/L — ABNORMAL HIGH (ref 38–126)
Anion gap: 10 (ref 5–15)
BUN: 19 mg/dL (ref 8–23)
CO2: 25 mmol/L (ref 22–32)
Calcium: 9.8 mg/dL (ref 8.9–10.3)
Chloride: 105 mmol/L (ref 98–111)
Creatinine, Ser: 0.9 mg/dL (ref 0.44–1.00)
GFR, Estimated: >60 mL/min (ref >60)
Glucose, Bld: 96 mg/dL (ref 70–99)
Potassium: 3.9 mmol/L (ref 3.5–5.1)
Sodium: 140 mmol/L (ref 135–145)
Total Bilirubin: 0.7 mg/dL (ref 0.3–1.2)
Total Protein: 7.2 g/dL (ref 6.5–8.1)

## 2021-07-04 MED ORDER — SODIUM CHLORIDE 0.9 % IV SOLN
420.0000 mg | Freq: Once | INTRAVENOUS | Status: AC
  Administered 2021-07-04: 420 mg via INTRAVENOUS
  Filled 2021-07-04: qty 14

## 2021-07-04 MED ORDER — HEPARIN SOD (PORK) LOCK FLUSH 100 UNIT/ML IV SOLN
500.0000 [IU] | Freq: Once | INTRAVENOUS | Status: AC | PRN
  Administered 2021-07-04: 500 [IU]
  Filled 2021-07-04: qty 5

## 2021-07-04 MED ORDER — SODIUM CHLORIDE 0.9 % IV SOLN
Freq: Once | INTRAVENOUS | Status: AC
  Filled 2021-07-04: qty 250

## 2021-07-04 MED ORDER — ACETAMINOPHEN 325 MG PO TABS
650.0000 mg | ORAL_TABLET | Freq: Once | ORAL | Status: AC
  Administered 2021-07-04: 650 mg via ORAL
  Filled 2021-07-04: qty 2

## 2021-07-04 MED ORDER — TRASTUZUMAB-ANNS CHEMO 150 MG IV SOLR
450.0000 mg | Freq: Once | INTRAVENOUS | Status: AC
  Administered 2021-07-04: 450 mg via INTRAVENOUS
  Filled 2021-07-04: qty 21.43

## 2021-07-04 MED ORDER — DIPHENHYDRAMINE HCL 25 MG PO CAPS
50.0000 mg | ORAL_CAPSULE | Freq: Once | ORAL | Status: AC
  Administered 2021-07-04: 50 mg via ORAL
  Filled 2021-07-04: qty 2

## 2021-07-04 NOTE — Progress Notes (Signed)
Patient tolerated pertuzumab/ trastuzumab-anns infusion well, no concerns voiced. Patient stable at discharge. AVS given.   ?

## 2021-07-04 NOTE — Progress Notes (Signed)
? ? ? ?Hematology/Oncology Consult note ?[facility]  ?[phone] [phone] ? ?Patient Care Team: ?Kirk Ruths, MD as PCP - General (Internal Medicine) ?Rico Junker, RN as Oncology Nurse Navigator  ? ?Name of the patient: Wendy Caldwell  ?[id number]  ?[birth year]  ? ?Date of visit: 07/04/21 ? 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  ? ?Chief complaint/ Reason for visit-on treatment assessment prior to cycle 10 of adjuvant Herceptin and Perjeta ? ?Heme/Onc history: Patient is a 66 year old female with a past medical history significant for psoriasis for which she is on Humira.  She self palpated a left breast mass which prompted a bilateral diagnostic mammogram on 06/25/2020.  That showed an irregular hypoechoic mass 2.5 x 1.7 x 1.8 cm at 1 o'clock position 1 cm from the nipple.  3 cm from the nipple were benign cysts.  1 mildly enlarged left axillary lymph node with cortical thickening 4 mm.  Both the breast mass and the lymph node were biopsied.  Left breast biopsy was consistent with invasive mammary carcinoma grade 3 ER weakly +1 to 10%, PR negative and HER2 positive by IHC. ?  ?Patient completed 4 cycles of neoadjuvant TCHP chemotherapy which was complicated by worsening skin rash and fatigue.  Patient did not wish to proceed with 2 further cycles of chemotherapy and proceeded with lumpectomy and sentinel lymph node biopsy.  Final pathology showed complete pathological response on 10/26/2020.  2 sentinel lymph nodes negative for malignancy. ?  ?Patient is completed adjuvant radiation treatment and is currently on adjuvant Herceptin and Perjeta.  She does not wish to take endocrine therapy for weakly ER positive tumor ? ?Interval history-she is doing well overall.  Denies any specific complaints at this time other than altered sensation at the site of lumpectomy and occasional  pain.  Her recent mammogram experience was particularly painful. ? ?ECOG PS- 0 ?Pain scale- 0 ? ? ?Review of systems- Review of Systems  ?Constitutional:  Negative for chills, fever, malaise/fatigue and weight loss.  ?HENT:  Negative for congestion, ear discharge and nosebleeds.   ?Eyes:  Negative for blurred vision.  ?Respiratory:  Negative for cough, hemoptysis, sputum production, shortness of breath and wheezing.   ?Cardiovascular:  Negative for chest pain, palpitations, orthopnea and claudication.  ?Gastrointestinal:  Negative for abdominal pain, blood in stool, constipation, diarrhea, heartburn, melena, nausea and vomiting.  ?Genitourinary:  Negative for dysuria, flank pain, frequency, hematuria and urgency.  ?Musculoskeletal:  Negative for back pain, joint pain and myalgias.  ?Skin:  Negative for rash.  ?Neurological:  Negative for dizziness, tingling, focal weakness, seizures, weakness and headaches.  ?Endo/Heme/Allergies:  Does not bruise/bleed easily.  ?Psychiatric/Behavioral:  Negative for depression and suicidal ideas. The patient does not have insomnia.    ? ? ? ?No Known Allergies ? ? ?Past Medical History:  ?Diagnosis Date  ? Family history of adverse reaction to anesthesia   ? adopted  ? GERD (gastroesophageal reflux disease)   ? ? ? ?Past Surgical History:  ?Procedure Laterality Date  ? BREAST BIOPSY Left 07/05/2020  ? [place] Bx, Q clip, IMC  ? BREAST BIOPSY Left 07/05/2020  ? [place] Bx, Vision, benign breast tissue with increased stromal fibrosis  ? BREAST LUMPECTOMY    ? CARPAL TUNNEL RELEASE Bilateral   ? CHOLECYSTECTOMY    ? GANGLION CYST EXCISION    ? wrist  ? PART [place]  NODE BIOPSY Left 10/26/2020  ? Procedure: PART MASTECTOMY,RADIO FREQUENCY LOCALIZER,AXILLARY SENTINEL NODE BIOPSY;  Surgeon: Herbert Pun, MD;  Location: [facility] ORS;  Service: General;  Laterality: Left;  ? PORTACATH PLACEMENT Right 07/21/2020  ? Procedure: INSERTION PORT-A-CATH;   Surgeon: Herbert Pun, MD;  Location: [facility] ORS;  Service: General;  Laterality: Right;  ? right ovary removal    ? ? ?Social History  ? ?Socioeconomic History  ? Marital status: Married  ?  Spouse name: Not on file  ? Number of children: Not on file  ? Years of education: Not on file  ? Highest education level: Not on file  ?Occupational History  ? Not on file  ?Tobacco Use  ? Smoking status: Former  ?  Types: Cigarettes  ?  Start date: 2012  ? Smokeless tobacco: Never  ?Vaping Use  ? Vaping Use: Never used  ?Substance and Sexual Activity  ? Alcohol use: Yes  ?  Comment: occassional   ? Drug use: Never  ? Sexual activity: Not on file  ?Other Topics Concern  ? Not on file  ?Social History Narrative  ? Not on file  ? ?Social Determinants of Health  ? ?Financial Resource Strain: Not on file  ?Food Insecurity: Not on file  ?Transportation Needs: Not on file  ?Physical Activity: Not on file  ?Stress: Not on file  ?Social Connections: Not on file  ?Intimate Partner Violence: Not on file  ? ? ?Family History  ?Adopted: Yes  ?Problem Relation Age of Onset  ? Breast cancer Neg Hx   ? ? ? ?Current Outpatient Medications:  ?  Lifitegrast (XIIDRA) 5 % SOLN, Apply to eye 2 (two) times daily., Disp: , Rfl:  ?  Multiple Vitamins-Minerals (EYE-VITE PLUS LUTEIN PO), Take 1 capsule by mouth 2 (two) times daily. AREDS2, Disp: , Rfl:  ?  Multiple Vitamins-Minerals (MULTIVITAMIN WITH MINERALS) tablet, Take 1 tablet by mouth daily., Disp: , Rfl:  ?  omeprazole (PRILOSEC) 40 MG capsule, Take 1 capsule (40 mg total) by mouth daily., Disp: 30 capsule, Rfl: 0 ?  Polyethylene Glycol 400 (BLINK TEARS) 0.25 % SOLN, Apply 1 drop to eye 5 (five) times daily as needed. Every 2 hrs daily, Disp: , Rfl:  ?  vitamin B-12 (CYANOCOBALAMIN) 1000 MCG tablet, Take 1,000 mcg by mouth daily., Disp: , Rfl:  ?  vitamin C (ASCORBIC ACID) 500 MG tablet, Take 500 mg by mouth daily., Disp: , Rfl:  ?  vitamin E 180 MG (400 UNITS) capsule, Take 400 Units by  mouth daily., Disp: , Rfl:  ?  azelastine (ASTELIN) 0.1 % nasal spray, Place 2 sprays into both nostrils 2 (two) times daily. Use in each nostril as directed (Patient not taking: Reported on 07/04/2021), Disp: 30 mL, Rfl: 2 ?  benzonatate (TESSALON) 100 MG capsule, Take 100 mg by mouth daily as needed for cough. (Patient not taking: Reported on 07/04/2021), Disp: , Rfl:  ?  loratadine-pseudoephedrine (CLARITIN-D 24 HOUR) 10-240 MG 24 hr tablet, Take 1 tablet by mouth daily. (Patient not taking: Reported on 10/29/2020), Disp: 30 tablet, Rfl: 0 ?  ondansetron (ZOFRAN-ODT) 4 MG disintegrating tablet, Take 4 mg by mouth daily as needed for nausea/vomiting. (Patient not taking: Reported on 10/29/2020), Disp: , Rfl:  ?  predniSONE (DELTASONE) 10 MG tablet, Take 1 tablet (10 mg total) by mouth daily with breakfast. Start with 5 tablets daily x 3 days , then 4 tablets x 3 day, then 3 tablets x 3 days, then 2 tablets  x 3 days and then 1 tablet x 3 days (Patient not taking: Reported on 10/29/2020), Disp: 45 tablet, Rfl: 0 ?  triamcinolone ointment (KENALOG) 0.5 %, Apply 1 application topically 2 (two) times daily. (Patient not taking: Reported on 10/29/2020), Disp: 30 g, Rfl: 0 ? ?Physical exam:  ?Vitals:  ? 07/04/21 0910  ?BP: 112/74  ?Pulse: 84  ?Resp: 18  ?Temp: (!) 96.7 ?F (35.9 ?C)  ?SpO2: 96%  ?Weight: 176 lb 6.4 oz (80 kg)  ? ?Physical Exam ?Constitutional:   ?   General: She is not in acute distress. ?Cardiovascular:  ?   Rate and Rhythm: Normal rate and regular rhythm.  ?   Heart sounds: Normal heart sounds.  ?Pulmonary:  ?   Effort: Pulmonary effort is normal.  ?Skin: ?   General: Skin is warm and dry.  ?Neurological:  ?   Mental Status: She is alert and oriented to person, place, and time.  ?  ? ? ?  Latest Ref Rng & Units 07/04/2021  ?  8:55 AM  ?CMP  ?Glucose 70 - 99 mg/dL 96    ?BUN 8 - 23 mg/dL 19    ?Creatinine 0.44 - 1.00 mg/dL 0.90    ?Sodium 135 - 145 mmol/L 140    ?Potassium 3.5 - 5.1 mmol/L 3.9    ?Chloride 98 -  111 mmol/L 105    ?CO2 22 - 32 mmol/L 25    ?Calcium 8.9 - 10.3 mg/dL 9.8    ?Total Protein 6.5 - 8.1 g/dL 7.2    ?Total Bilirubin 0.3 - 1.2 mg/dL 0.7    ?Alkaline Phos 38 - 126 U/L 128    ?AST 15 - 41

## 2021-07-04 NOTE — Patient Instructions (Signed)
[facility] CANCER CTR AT [place]  Discharge Instructions: ?Thank you for choosing [facility] to provide your oncology and hematology care.  ?If you have a lab appointment with the [facility], please go directly to the [facility] and check in at the registration area. ? ?Wear comfortable clothing and clothing appropriate for easy access to any Portacath or PICC line.  ? ?We strive to give you quality time with your provider. You may need to reschedule your appointment if you arrive late (15 or more minutes).  Arriving late affects you and other patients whose appointments are after yours.  Also, if you miss three or more appointments without notifying the office, you may be dismissed from the clinic at the provider?s discretion.    ?  ?For prescription refill requests, have your pharmacy contact our office and allow 72 hours for refills to be completed.   ? ?Today you received the following chemotherapy and/or immunotherapy agents: pertuzumab & trastuzumab-anns   ?  ?To help prevent nausea and vomiting after your treatment, we encourage you to take your nausea medication as directed. ? ?BELOW ARE SYMPTOMS THAT SHOULD BE REPORTED IMMEDIATELY: ?*FEVER GREATER THAN 100.4 F (38 ?C) OR HIGHER ?*CHILLS OR SWEATING ?*NAUSEA AND VOMITING THAT IS NOT CONTROLLED WITH YOUR NAUSEA MEDICATION ?*UNUSUAL SHORTNESS OF BREATH ?*UNUSUAL BRUISING OR BLEEDING ?*URINARY PROBLEMS (pain or burning when urinating, or frequent urination) ?*BOWEL PROBLEMS (unusual diarrhea, constipation, pain near the anus) ?TENDERNESS IN MOUTH AND THROAT WITH OR WITHOUT PRESENCE OF ULCERS (sore throat, sores in mouth, or a toothache) ?UNUSUAL RASH, SWELLING OR PAIN  ?UNUSUAL VAGINAL DISCHARGE OR ITCHING  ? ?Items with * indicate a potential emergency and should be followed up as soon as possible or go to the Emergency Department if any problems should occur. ? ?Please show the CHEMOTHERAPY ALERT CARD or IMMUNOTHERAPY ALERT  CARD at check-in to the Emergency Department and triage nurse. ? ?Should you have questions after your visit or need to cancel or reschedule your appointment, please contact [facility] CANCER [place] AT [place]  [phone] and follow the prompts.  Office hours are 8:00 a.m. to 4:30 p.m. Monday - Friday. Please note that voicemails left after 4:00 p.m. may not be returned until the following business day.  We are closed weekends and major holidays. You have access to a nurse at all times for urgent questions. Please call the main number to the clinic [phone] and follow the prompts. ? ?For any non-urgent questions, you may also contact your provider using MyChart. We now offer e-Visits for anyone 44 and older to request care online for non-urgent symptoms. For details visit [email or web address]. ?  ?Also download the MyChart app! Go to the app store, search "MyChart", open the app, select [facility], and log in with your MyChart username and password. ? ?Due to Covid, a mask is required upon entering the hospital/clinic. If you do not have a mask, one will be given to you upon arrival. For doctor visits, patients may have 1 support person aged 54 or older with them. For treatment visits, patients cannot have anyone with them due to current Covid guidelines and our immunocompromised population.  ?

## 2021-07-05 DIAGNOSIS — C50412 Malignant neoplasm of upper-outer quadrant of left female breast: Secondary | ICD-10-CM | POA: Diagnosis not present

## 2021-07-07 ENCOUNTER — Other Ambulatory Visit: Payer: Self-pay | Admitting: *Deleted

## 2021-07-07 ENCOUNTER — Encounter: Payer: Self-pay | Admitting: Oncology

## 2021-07-07 DIAGNOSIS — Z170 Estrogen receptor positive status [ER+]: Secondary | ICD-10-CM

## 2021-07-11 ENCOUNTER — Ambulatory Visit
Admission: RE | Admit: 2021-07-11 | Discharge: 2021-07-11 | Disposition: A | Discharge status: Home / Self Care | Payer: BC Managed Care – PPO | Source: Ambulatory Visit | Attending: Radiation Oncology | Admitting: Radiation Oncology

## 2021-07-11 ENCOUNTER — Encounter: Payer: Self-pay | Admitting: *Deleted

## 2021-07-11 VITALS — BP 124/80 | HR 68 | Temp 97.3°F | Resp 18 | Wt 176.0 lb

## 2021-07-11 DIAGNOSIS — Z08 Encounter for follow-up examination after completed treatment for malignant neoplasm: Secondary | ICD-10-CM | POA: Diagnosis not present

## 2021-07-11 DIAGNOSIS — Z170 Estrogen receptor positive status [ER+]: Secondary | ICD-10-CM | POA: Insufficient documentation

## 2021-07-11 DIAGNOSIS — C50412 Malignant neoplasm of upper-outer quadrant of left female breast: Secondary | ICD-10-CM | POA: Diagnosis not present

## 2021-07-11 DIAGNOSIS — Z923 Personal history of irradiation: Secondary | ICD-10-CM | POA: Insufficient documentation

## 2021-07-11 DIAGNOSIS — Z79811 Long term (current) use of aromatase inhibitors: Secondary | ICD-10-CM | POA: Diagnosis not present

## 2021-07-11 NOTE — Progress Notes (Signed)
Radiation Oncology ?Follow up Note ? ?Name: Wendy Caldwell   ?Date:   07/11/2021 ?MRN:  [id number] ?DOB: 05-22-1955  ? ? ?This 66 y.o. female presents to the clinic today for 5-monthfollow-up status post whole breast radiation to her left breast for stage IIa (T2 N0 M0) ER weakly positive PR negative HER2/neu overexpressed invasive mammary carcinoma who completed neoadjuvant chemotherapy. ? ?REFERRING PROVIDER: AKirk Ruths MD ? ?HPI: Patient is a 66year old female now about 6 months having completed whole breast radiation to her left breast for stage IIa HER2/neu overexpressed invasive mammary carcinoma status post neoadjuvant chemotherapy followed by wide local excision and adjuvant radiation.  Seen today in routine follow-up she is doing well.  She is opted not to have antiestrogen therapy.  She had a mammogram.  Last month which I have reviewed was BI-RADS 2 benign.  She is currently on cycle 10 of adjuvant Herceptin and Perjeta which she is tolerating well ? ?COMPLICATIONS OF TREATMENT: none ? ?FOLLOW UP COMPLIANCE: keeps appointments  ? ?PHYSICAL EXAM:  ?BP 124/80   Pulse 68   Temp (!) 97.3 ?F (36.3 ?C)   Resp 18   Wt 176 lb (79.8 kg)   BMI 30.21 kg/m?  ?Lungs are clear to A&P cardiac examination essentially unremarkable with regular rate and rhythm. No dominant mass or nodularity is noted in either breast in 2 positions examined. Incision is well-healed. No axillary or supraclavicular adenopathy is appreciated. Cosmetic result is excellent.  Well-developed well-nourished patient in NAD. HEENT reveals PERLA, EOMI, discs not visualized.  Oral cavity is clear. No oral mucosal lesions are identified. Neck is clear without evidence of cervical or supraclavicular adenopathy. Lungs are clear to A&P. Cardiac examination is essentially unremarkable with regular rate and rhythm without murmur rub or thrill. Abdomen is benign with no organomegaly or masses noted. Motor sensory and DTR levels are equal  and symmetric in the upper and lower extremities. Cranial nerves II through XII are grossly intact. Proprioception is intact. No peripheral adenopathy or edema is identified. No motor or sensory levels are noted. Crude visual fields are within normal range. ? ?RADIOLOGY RESULTS: Mammograms reviewed compatible with above-stated findings ? ?PLAN: Present time patient is doing well 6 months out from whole breast radiation and pleased with her overall progress.  I have asked to see her back in 6 months and we will start her 1 year follow-ups.  She is currently on Herceptin and Perjeta under medical oncology's care which she is tolerating well.  Patient knows to call with any concerns at any time. ? ?I would like to take this opportunity to thank you for allowing me to participate in the care of your patient.. ? ? ?  ? GNoreene Filbert MD ? ?

## 2021-07-11 NOTE — Progress Notes (Signed)
Patient did not want to make a follow up appointment with Dr. Baruch Gouty, will continue to follow up with med onc.   ?

## 2021-07-25 ENCOUNTER — Inpatient Hospital Stay: Payer: BC Managed Care – PPO | Attending: Oncology

## 2021-07-25 VITALS — BP 116/76 | HR 76 | Temp 97.5°F | Resp 16 | Wt 178.2 lb

## 2021-07-25 DIAGNOSIS — Z5112 Encounter for antineoplastic immunotherapy: Secondary | ICD-10-CM | POA: Diagnosis not present

## 2021-07-25 DIAGNOSIS — Z79899 Other long term (current) drug therapy: Secondary | ICD-10-CM | POA: Insufficient documentation

## 2021-07-25 DIAGNOSIS — C50412 Malignant neoplasm of upper-outer quadrant of left female breast: Secondary | ICD-10-CM | POA: Diagnosis not present

## 2021-07-25 DIAGNOSIS — Z170 Estrogen receptor positive status [ER+]: Secondary | ICD-10-CM | POA: Diagnosis not present

## 2021-07-25 MED ORDER — ACETAMINOPHEN 325 MG PO TABS
650.0000 mg | ORAL_TABLET | Freq: Once | ORAL | Status: AC
  Administered 2021-07-25: 650 mg via ORAL
  Filled 2021-07-25: qty 2

## 2021-07-25 MED ORDER — HEPARIN SOD (PORK) LOCK FLUSH 100 UNIT/ML IV SOLN
500.0000 [IU] | Freq: Once | INTRAVENOUS | Status: AC | PRN
  Administered 2021-07-25: 500 [IU]
  Filled 2021-07-25: qty 5

## 2021-07-25 MED ORDER — DIPHENHYDRAMINE HCL 25 MG PO CAPS
50.0000 mg | ORAL_CAPSULE | Freq: Once | ORAL | Status: AC
  Administered 2021-07-25: 50 mg via ORAL
  Filled 2021-07-25: qty 2

## 2021-07-25 MED ORDER — SODIUM CHLORIDE 0.9 % IV SOLN
Freq: Once | INTRAVENOUS | Status: AC
  Filled 2021-07-25: qty 250

## 2021-07-25 MED ORDER — SODIUM CHLORIDE 0.9% FLUSH
10.0000 mL | INTRAVENOUS | Status: DC | PRN
  Filled 2021-07-25: qty 10

## 2021-07-25 MED ORDER — SODIUM CHLORIDE 0.9 % IV SOLN
420.0000 mg | Freq: Once | INTRAVENOUS | Status: AC
  Administered 2021-07-25: 420 mg via INTRAVENOUS
  Filled 2021-07-25: qty 14

## 2021-07-25 MED ORDER — TRASTUZUMAB-ANNS CHEMO 150 MG IV SOLR
450.0000 mg | Freq: Once | INTRAVENOUS | Status: AC
  Administered 2021-07-25: 450 mg via INTRAVENOUS
  Filled 2021-07-25: qty 21.43

## 2021-07-25 NOTE — Progress Notes (Signed)
Patient declined 30 minute post observation. 

## 2021-07-25 NOTE — Patient Instructions (Signed)
[facility] CANCER CTR AT [place]  Discharge Instructions: ?Thank you for choosing [facility] to provide your oncology and hematology care.  ?If you have a lab appointment with the [facility], please go directly to the [facility] and check in at the registration area. ? ?Wear comfortable clothing and clothing appropriate for easy access to any Portacath or PICC line.  ? ?We strive to give you quality time with your provider. You may need to reschedule your appointment if you arrive late (15 or more minutes).  Arriving late affects you and other patients whose appointments are after yours.  Also, if you miss three or more appointments without notifying the office, you may be dismissed from the clinic at the provider?s discretion.    ?  ?For prescription refill requests, have your pharmacy contact our office and allow 72 hours for refills to be completed.   ? ?Today you received the following chemotherapy and/or immunotherapy agents [place]    ?  ?To help prevent nausea and vomiting after your treatment, we encourage you to take your nausea medication as directed. ? ?BELOW ARE SYMPTOMS THAT SHOULD BE REPORTED IMMEDIATELY: ?*FEVER GREATER THAN 100.4 F (38 ?C) OR HIGHER ?*CHILLS OR SWEATING ?*NAUSEA AND VOMITING THAT IS NOT CONTROLLED WITH YOUR NAUSEA MEDICATION ?*UNUSUAL SHORTNESS OF BREATH ?*UNUSUAL BRUISING OR BLEEDING ?*URINARY PROBLEMS (pain or burning when urinating, or frequent urination) ?*BOWEL PROBLEMS (unusual diarrhea, constipation, pain near the anus) ?TENDERNESS IN MOUTH AND THROAT WITH OR WITHOUT PRESENCE OF ULCERS (sore throat, sores in mouth, or a toothache) ?UNUSUAL RASH, SWELLING OR PAIN  ?UNUSUAL VAGINAL DISCHARGE OR ITCHING  ? ?Items with * indicate a potential emergency and should be followed up as soon as possible or go to the Emergency Department if any problems should occur. ? ?Please show the CHEMOTHERAPY ALERT CARD or IMMUNOTHERAPY ALERT CARD at  check-in to the Emergency Department and triage nurse. ? ?Should you have questions after your visit or need to cancel or reschedule your appointment, please contact [facility] CANCER [place] AT [place]  [phone] and follow the prompts.  Office hours are 8:00 a.m. to 4:30 p.m. Monday - Friday. Please note that voicemails left after 4:00 p.m. may not be returned until the following business day.  We are closed weekends and major holidays. You have access to a nurse at all times for urgent questions. Please call the main number to the clinic [phone] and follow the prompts. ? ?For any non-urgent questions, you may also contact your provider using MyChart. We now offer e-Visits for anyone 27 and older to request care online for non-urgent symptoms. For details visit [email or web address]. ?  ?Also download the MyChart app! Go to the app store, search "MyChart", open the app, select , and log in with your MyChart username and password. ? ?Due to Covid, a mask is required upon entering the hospital/clinic. If you do not have a mask, one will be given to you upon arrival. For doctor visits, patients may have 1 support person aged 26 or older with them. For treatment visits, patients cannot have anyone with them due to current Covid guidelines and our immunocompromised population.  ?

## 2021-08-15 ENCOUNTER — Inpatient Hospital Stay: Payer: BC Managed Care – PPO | Attending: Oncology

## 2021-08-15 ENCOUNTER — Inpatient Hospital Stay (HOSPITAL_BASED_OUTPATIENT_CLINIC_OR_DEPARTMENT_OTHER): Payer: BC Managed Care – PPO | Admitting: Nurse Practitioner

## 2021-08-15 ENCOUNTER — Encounter: Payer: Self-pay | Admitting: Nurse Practitioner

## 2021-08-15 ENCOUNTER — Inpatient Hospital Stay: Payer: BC Managed Care – PPO

## 2021-08-15 VITALS — BP 141/76 | HR 76 | Temp 98.7°F | Resp 20 | Wt 183.7 lb

## 2021-08-15 DIAGNOSIS — Z5112 Encounter for antineoplastic immunotherapy: Secondary | ICD-10-CM | POA: Diagnosis not present

## 2021-08-15 DIAGNOSIS — R718 Other abnormality of red blood cells: Secondary | ICD-10-CM | POA: Diagnosis not present

## 2021-08-15 DIAGNOSIS — C50412 Malignant neoplasm of upper-outer quadrant of left female breast: Secondary | ICD-10-CM | POA: Insufficient documentation

## 2021-08-15 DIAGNOSIS — Z170 Estrogen receptor positive status [ER+]: Secondary | ICD-10-CM

## 2021-08-15 DIAGNOSIS — Z79899 Other long term (current) drug therapy: Secondary | ICD-10-CM | POA: Diagnosis not present

## 2021-08-15 LAB — CBC WITH DIFFERENTIAL/PLATELET
Abs Immature Granulocytes: 0.02 10*3/uL (ref 0.00–0.07)
Basophils Absolute: 0 10*3/uL (ref 0.0–0.1)
Basophils Relative: 1 %
Eosinophils Absolute: 0.3 10*3/uL (ref 0.0–0.5)
Eosinophils Relative: 3 %
HCT: 37.4 % (ref 36.0–46.0)
Hemoglobin: 11.9 g/dL — ABNORMAL LOW (ref 12.0–15.0)
Immature Granulocytes: 0 %
Lymphocytes Relative: 20 %
Lymphs Abs: 1.6 10*3/uL (ref 0.7–4.0)
MCH: 20.1 pg — ABNORMAL LOW (ref 26.0–34.0)
MCHC: 31.8 g/dL (ref 30.0–36.0)
MCV: 63.2 fL — ABNORMAL LOW (ref 80.0–100.0)
Monocytes Absolute: 0.7 10*3/uL (ref 0.1–1.0)
Monocytes Relative: 9 %
Neutro Abs: 5.6 10*3/uL (ref 1.7–7.7)
Neutrophils Relative %: 67 %
Platelets: 279 10*3/uL (ref 150–400)
RBC: 5.92 MIL/uL — ABNORMAL HIGH (ref 3.87–5.11)
RDW: 15.4 % (ref 11.5–15.5)
WBC: 8.3 10*3/uL (ref 4.0–10.5)
nRBC: 0 % (ref 0.0–0.2)

## 2021-08-15 LAB — COMPREHENSIVE METABOLIC PANEL
ALT: 23 U/L (ref 0–44)
AST: 22 U/L (ref 15–41)
Albumin: 3.6 g/dL (ref 3.5–5.0)
Alkaline Phosphatase: 101 U/L (ref 38–126)
Anion gap: 8 (ref 5–15)
BUN: 14 mg/dL (ref 8–23)
CO2: 26 mmol/L (ref 22–32)
Calcium: 8.8 mg/dL — ABNORMAL LOW (ref 8.9–10.3)
Chloride: 105 mmol/L (ref 98–111)
Creatinine, Ser: 0.82 mg/dL (ref 0.44–1.00)
GFR, Estimated: >60 mL/min (ref >60)
Glucose, Bld: 112 mg/dL — ABNORMAL HIGH (ref 70–99)
Potassium: 3.7 mmol/L (ref 3.5–5.1)
Sodium: 139 mmol/L (ref 135–145)
Total Bilirubin: 0.6 mg/dL (ref 0.3–1.2)
Total Protein: 7.3 g/dL (ref 6.5–8.1)

## 2021-08-15 MED ORDER — SODIUM CHLORIDE 0.9 % IV SOLN
420.0000 mg | Freq: Once | INTRAVENOUS | Status: AC
  Administered 2021-08-15: 420 mg via INTRAVENOUS
  Filled 2021-08-15: qty 14

## 2021-08-15 MED ORDER — TRASTUZUMAB-ANNS CHEMO 150 MG IV SOLR
450.0000 mg | Freq: Once | INTRAVENOUS | Status: AC
  Administered 2021-08-15: 450 mg via INTRAVENOUS
  Filled 2021-08-15: qty 21.43

## 2021-08-15 MED ORDER — DIPHENHYDRAMINE HCL 25 MG PO CAPS
50.0000 mg | ORAL_CAPSULE | Freq: Once | ORAL | Status: AC
  Administered 2021-08-15: 50 mg via ORAL

## 2021-08-15 MED ORDER — HEPARIN SOD (PORK) LOCK FLUSH 100 UNIT/ML IV SOLN
500.0000 [IU] | Freq: Once | INTRAVENOUS | Status: AC | PRN
  Administered 2021-08-15: 500 [IU]
  Filled 2021-08-15: qty 5

## 2021-08-15 MED ORDER — ACETAMINOPHEN 325 MG PO TABS
650.0000 mg | ORAL_TABLET | Freq: Once | ORAL | Status: AC
  Administered 2021-08-15: 650 mg via ORAL

## 2021-08-15 MED ORDER — ACETAMINOPHEN 325 MG PO TABS
ORAL_TABLET | ORAL | Status: AC
  Filled 2021-08-15: qty 2

## 2021-08-15 MED ORDER — DIPHENHYDRAMINE HCL 25 MG PO CAPS
ORAL_CAPSULE | ORAL | Status: AC
  Filled 2021-08-15: qty 2

## 2021-08-15 MED ORDER — SODIUM CHLORIDE 0.9 % IV SOLN
Freq: Once | INTRAVENOUS | Status: AC
  Filled 2021-08-15: qty 250

## 2021-08-15 NOTE — Progress Notes (Signed)
Hematology/Oncology Consult Note [facility]  [phone] [phone]  Patient Care Team: Kirk Ruths, MD as PCP - General (Internal Medicine) Rico Junker, RN as Oncology Nurse Navigator   Name of the patient: Wendy Caldwell  [id number]  1955/09/10   Date of visit: 08/15/21  Diagnosis-  left breast cancer clinical prognostic stage IIA cT2 N0 M0 ER weakly positive, PR negative HER2 positive    Chief complaint/ Reason for visit-on treatment assessment prior to cycle 13 of adjuvant Herceptin and Perjeta  Heme/Onc history: Patient is a 66 year old female with a past medical history significant for psoriasis for which she is on Humira.  She self palpated a left breast mass which prompted a bilateral diagnostic mammogram on 06/25/2020.  That showed an irregular hypoechoic mass 2.5 x 1.7 x 1.8 cm at 1 o'clock position 1 cm from the nipple.  3 cm from the nipple were benign cysts.  1 mildly enlarged left axillary lymph node with cortical thickening 4 mm.  Both the breast mass and the lymph node were biopsied.  Left breast biopsy was consistent with invasive mammary carcinoma grade 3 ER weakly +1 to 10%, PR negative and HER2 positive by IHC.   Patient completed 4 cycles of neoadjuvant TCHP chemotherapy which was complicated by worsening skin rash and fatigue.  Patient did not wish to proceed with 2 further cycles of chemotherapy and proceeded with lumpectomy and sentinel lymph node biopsy.  Final pathology showed complete pathological response on 10/26/2020.  2 sentinel lymph nodes negative for malignancy.   Patient is completed adjuvant radiation treatment and is currently on adjuvant Herceptin and Perjeta.  She does not wish to take endocrine therapy for weakly ER positive tumor  Interval history- Patient is 66 year old female who returns to [place] for evaluationa dn consideration of cycle 13 of adjuvant herceptin and perjeta. Continues to  tolerate treatment well and denies complaints. She has fatigue with her infusions but is able to continue to work. She has tightness and skin thickening at the inframammary ridge which is chronic. Has gained weight during treatment and is interested in losing weight.   ECOG PS- 0 Pain scale- 0  Review of systems- Review of Systems  Constitutional:  Negative for chills, fever, malaise/fatigue and weight loss.  HENT:  Negative for congestion, ear discharge and nosebleeds.   Eyes:  Negative for blurred vision.  Respiratory:  Negative for cough, hemoptysis, sputum production, shortness of breath and wheezing.   Cardiovascular:  Negative for chest pain, palpitations, orthopnea and claudication.  Gastrointestinal:  Negative for abdominal pain, blood in stool, constipation, diarrhea, heartburn, melena, nausea and vomiting.  Genitourinary:  Negative for dysuria, flank pain, frequency, hematuria and urgency.  Musculoskeletal:  Negative for back pain, joint pain and myalgias.  Skin:  Negative for rash.  Neurological:  Negative for dizziness, tingling, focal weakness, seizures, weakness and headaches.  Endo/Heme/Allergies:  Does not bruise/bleed easily.  Psychiatric/Behavioral:  Negative for depression and suicidal ideas. The patient does not have insomnia.     No Known Allergies  Past Medical History:  Diagnosis Date   Family history of adverse reaction to anesthesia    adopted   GERD (gastroesophageal reflux disease)    Past Surgical History:  Procedure Laterality Date   BREAST BIOPSY Left 07/05/2020   [place] Bx, Q clip, [facility]   BREAST BIOPSY Left 07/05/2020   [place] Bx, [facility], benign breast tissue with increased stromal fibrosis   BREAST LUMPECTOMY  CARPAL TUNNEL RELEASE Bilateral    CHOLECYSTECTOMY     GANGLION CYST EXCISION     wrist   PART MASTECTOMY,RADIO FREQUENCY LOCALIZER,AXILLARY SENTINEL NODE BIOPSY Left 10/26/2020   Procedure: PART MASTECTOMY,RADIO FREQUENCY LOCALIZER,AXILLARY  SENTINEL NODE BIOPSY;  Surgeon: Herbert Pun, MD;  Location: [facility] ORS;  Service: General;  Laterality: Left;   PORTACATH PLACEMENT Right 07/21/2020   Procedure: INSERTION PORT-A-CATH;  Surgeon: Herbert Pun, MD;  Location: [facility] ORS;  Service: General;  Laterality: Right;   right ovary removal     Social History   Socioeconomic History   Marital status: Married    Spouse name: Not on file   Number of children: Not on file   Years of education: Not on file   Highest education level: Not on file  Occupational History   Not on file  Tobacco Use   Smoking status: Former    Types: Cigarettes    Start date: 2012   Smokeless tobacco: Never  Vaping Use   Vaping Use: Never used  Substance and Sexual Activity   Alcohol use: Yes    Comment: occassional    Drug use: Never   Sexual activity: Not on file  Other Topics Concern   Not on file  Social History Narrative   Not on file   Social Determinants of Health   Financial Resource Strain: Not on file  Food Insecurity: Not on file  Transportation Needs: Not on file  Physical Activity: Not on file  Stress: Not on file  Social Connections: Not on file  Intimate Partner Violence: Not on file   Family History  Adopted: Yes  Problem Relation Age of Onset   Breast cancer Neg Hx    Current Outpatient Medications:    azelastine (ASTELIN) 0.1 % nasal spray, Place 2 sprays into both nostrils 2 (two) times daily. Use in each nostril as directed (Patient not taking: Reported on 07/04/2021), Disp: 30 mL, Rfl: 2   benzonatate (TESSALON) 100 MG capsule, Take 100 mg by mouth daily as needed for cough. (Patient not taking: Reported on 07/04/2021), Disp: , Rfl:    Lifitegrast (XIIDRA) 5 % SOLN, Apply to eye 2 (two) times daily., Disp: , Rfl:    loratadine-pseudoephedrine (CLARITIN-D 24 HOUR) 10-240 MG 24 hr tablet, Take 1 tablet by mouth daily. (Patient not taking: Reported on 10/29/2020), Disp: 30 tablet, Rfl: 0   Multiple  Vitamins-Minerals (EYE-VITE PLUS LUTEIN PO), Take 1 capsule by mouth 2 (two) times daily. AREDS2, Disp: , Rfl:    Multiple Vitamins-Minerals (MULTIVITAMIN WITH MINERALS) tablet, Take 1 tablet by mouth daily., Disp: , Rfl:    omeprazole (PRILOSEC) 40 MG capsule, Take 1 capsule (40 mg total) by mouth daily., Disp: 30 capsule, Rfl: 0   ondansetron (ZOFRAN-ODT) 4 MG disintegrating tablet, Take 4 mg by mouth daily as needed for nausea/vomiting. (Patient not taking: Reported on 10/29/2020), Disp: , Rfl:    Polyethylene Glycol 400 (BLINK TEARS) 0.25 % SOLN, Apply 1 drop to eye 5 (five) times daily as needed. Every 2 hrs daily, Disp: , Rfl:    predniSONE (DELTASONE) 10 MG tablet, Take 1 tablet (10 mg total) by mouth daily with breakfast. Start with 5 tablets daily x 3 days , then 4 tablets x 3 day, then 3 tablets x 3 days, then 2 tablets x 3 days and then 1 tablet x 3 days (Patient not taking: Reported on 10/29/2020), Disp: 45 tablet, Rfl: 0   triamcinolone ointment (KENALOG) 0.5 %, Apply 1 application topically 2 (  two) times daily. (Patient not taking: Reported on 10/29/2020), Disp: 30 g, Rfl: 0   vitamin B-12 (CYANOCOBALAMIN) 1000 MCG tablet, Take 1,000 mcg by mouth daily., Disp: , Rfl:    vitamin C (ASCORBIC ACID) 500 MG tablet, Take 500 mg by mouth daily., Disp: , Rfl:    vitamin E 180 MG (400 UNITS) capsule, Take 400 Units by mouth daily., Disp: , Rfl:   Physical exam:  Vitals:   08/15/21 0938  BP: (!) 141/76  Pulse: 76  Resp: 20  Temp: 98.7 F (37.1 C)  SpO2: 100%  Weight: 183 lb 11.2 oz (83.3 kg)   Physical Exam Constitutional:      General: She is not in acute distress. Cardiovascular:     Rate and Rhythm: Normal rate and regular rhythm.     Heart sounds: Normal heart sounds.  Pulmonary:     Effort: Pulmonary effort is normal.  Skin:    General: Skin is warm and dry.  Neurological:     Mental Status: She is alert and oriented to person, place, and time.        Latest Ref Rng & Units  07/04/2021    8:55 AM  CMP  Glucose 70 - 99 mg/dL 96    BUN 8 - 23 mg/dL 19    Creatinine 0.44 - 1.00 mg/dL 0.90    Sodium 135 - 145 mmol/L 140    Potassium 3.5 - 5.1 mmol/L 3.9    Chloride 98 - 111 mmol/L 105    CO2 22 - 32 mmol/L 25    Calcium 8.9 - 10.3 mg/dL 9.8    Total Protein 6.5 - 8.1 g/dL 7.2    Total Bilirubin 0.3 - 1.2 mg/dL 0.7    Alkaline Phos 38 - 126 U/L 128    AST 15 - 41 U/L 24    ALT 0 - 44 U/L 38        Latest Ref Rng & Units 08/15/2021    9:09 AM  CBC  WBC 4.0 - 10.5 K/uL 8.3    Hemoglobin 12.0 - 15.0 g/dL 11.9    Hematocrit 36.0 - 46.0 % 37.4    Platelets 150 - 400 K/uL 279      No images are attached to the encounter.  No results found.   Assessment and plan- Patient is a 66 y.o. female with clinical prognostic stage IIa T2 N0 M0 grade 3 invasive mammary carcinoma ER weakly positive, PR negative and HER2 positive.  She received 4 cycles of neoadjuvant TCHP chemotherapy with complete pathological response.  She is here for on treatment assessment prior to cycle 13 of adjuvant Herceptin and Perjeta.   Counts okay to proceed with cycle 13 of adjuvant Herceptin and Perjeta today which is her last cycle. We reviewed NCCN guidelines regarding surveillance. She declines surveillance visits with rad-onc and prefers to be followed by medical oncology. Will refer to [facility] for her survivorship care plan visit. REfer to CARE program for exercise post-treatment without restriction. Can trial acupuncture and/or massage to see if this improves her range of motion and comfort post operatively in affected breast. She will need yearly mammograms for surveillance and next due in April 2024.   Hemoglobin is slightly decreased. 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 Hemoglobin fractionation cascade pending.   April 2023 echocardiogram was normal as well.  Repeat echocardiogram in 6 months to monitor for post-treatment cardiac  effects.   Port-a-cath- in place and functioning well.  Plan to flush every 8-12 weeks.   Disposition: Treatment today RTC in 3 months for port/lab (cbc, cmp), Dr. Janese Banks   Visit Diagnosis 1. Malignant neoplasm of upper-outer quadrant of left breast in female, estrogen receptor positive ([place])   2. Encounter for monoclonal antibody treatment for malignancy    Beckey Rutter, DNP, AGNP-C [place] at [facility] [phone] (clinic) 08/15/2021  CC: Dr. Ouida Sills, Dr. Janese Banks

## 2021-08-15 NOTE — Patient Instructions (Addendum)
As we discussed, you may notice some improvement in your symptoms with acupuncture. Given your history of breast cancer, you may be eligible to have the cost of 6 sessions covered by a grant. I will contact your nurse navigators Al Pimple, Tanya Nones, and Casper Harrison who will request approval from the clinics and then the clinic will contact you to set up an appointment. Please let me know if you do not hear something from them in the next week.   - [place]) or [phone] Phillip Heal)  I've also sent referral to [facility] for a survivorship care plan visit to discuss resources available to you. I've also referred you to the CARE program which we discussed for OT/PT/exercise.   It was a pleasure meeting you today and thank you for allowing me to participate in your care. -Beckey Rutter, NP

## 2021-08-15 NOTE — Progress Notes (Unsigned)
Survivorship Care Plan visit completed.  Treatment summary reviewed and given to patient.  ASCO answers booklet reviewed and given to patient.  CARE program and Cancer Transitions discussed with patient along with other resources cancer center offers to patients and caregivers.  Patient verbalized understanding.    

## 2021-08-16 ENCOUNTER — Encounter: Payer: Self-pay | Admitting: Oncology

## 2021-08-16 ENCOUNTER — Telehealth: Payer: Self-pay | Admitting: Oncology

## 2021-08-16 ENCOUNTER — Encounter: Payer: Self-pay | Admitting: Nurse Practitioner

## 2021-08-16 NOTE — Telephone Encounter (Signed)
LVM for pt with appt info for [facility] .Marland KitchenKJ

## 2021-08-17 LAB — HGB FRACTIONATION CASCADE
Hgb A2: 5.1 % — ABNORMAL HIGH (ref 1.8–3.2)
Hgb A: 94.9 % — ABNORMAL LOW (ref 96.4–98.8)
Hgb F: 0 % (ref 0.0–2.0)
Hgb S: 0 %

## 2021-08-18 ENCOUNTER — Other Ambulatory Visit: Payer: Self-pay | Admitting: Nurse Practitioner

## 2021-08-18 MED ORDER — PREDNISONE 10 MG PO TABS: 10.0000 mg | ORAL_TABLET | Freq: Every day | ORAL | 0 refills | Status: DC

## 2021-08-18 MED ORDER — CYCLOBENZAPRINE HCL 5 MG PO TABS: 5.0000 mg | ORAL_TABLET | Freq: Three times a day (TID) | ORAL | 0 refills | Status: DC | PRN

## 2021-08-18 NOTE — Progress Notes (Signed)
Spoke to patient re: back pain. She reports muscle strain after assisting husband post pacemaker placement. Has not improved with rest and heat. Recommend steroid taper and flexeril. If unresolved or if symptoms return, recommend imaging.

## 2021-08-24 ENCOUNTER — Inpatient Hospital Stay: Payer: BC Managed Care – PPO | Admitting: Occupational Therapy

## 2021-08-24 DIAGNOSIS — L905 Scar conditions and fibrosis of skin: Secondary | ICD-10-CM

## 2021-08-24 NOTE — Therapy (Unsigned)
[facility] at [facility] [place], [place], [place], [address] Phone: [phone]   Fax:  [phone]  Occupational Therapy Screen:  Patient Details  Name: Wendy [place]dwell MRN: [id number] Date of Birth: [birth year] No data recorded  Encounter Date: 08/24/2021   OT End of Session - 08/24/21 1256     Visit Number 0             Past Medical History:  Diagnosis Date   Family history of adverse reaction to anesthesia    adopted   GERD (gastroesophageal reflux disease)     Past Surgical History:  Procedure Laterality Date   BREAST BIOPSY Left 07/05/2020   [place] Bx, Q clip, [facility]   BREAST BIOPSY Left 07/05/2020   [place] Bx, Vision, benign breast tissue with increased stromal fibrosis   BREAST LUMPECTOMY     CARPAL TUNNEL RELEASE Bilateral    CHOLECYSTECTOMY     GANGLION CYST EXCISION     wrist   PART MASTECTOMY,RADIO FREQUENCY LOCALIZER,AXILLARY SENTINEL NODE BIOPSY Left 10/26/2020   Procedure: PART MASTECTOMY,RADIO FREQUENCY LOCALIZER,AXILLARY SENTINEL NODE BIOPSY;  Surgeon: Herbert Pun, MD;  Location: [facility] ORS;  Service: General;  Laterality: Left;   PORTACATH PLACEMENT Right 07/21/2020   Procedure: INSERTION PORT-A-CATH;  Surgeon: Herbert Pun, MD;  Location: [facility] ORS;  Service: General;  Laterality: Right;   right ovary removal      There were no vitals filed for this visit.   Subjective Assessment - 08/24/21 1253     Subjective  My L breast feels fuller , heavier and tender. At times more than others. Pain about 5/10 . My arm feels okay. 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 Had lumpectomy and radiation - stopped prior to surgery at 4 sessions of chemo - had reaction    Currently in Pain? Yes    Pain Score 5     Pain Location Breast    Pain Orientation Left    Pain Descriptors / Indicators Tightness;Tender    Pain Type Surgical pain    Pain Onset More than a month ago    Pain Frequency Constant                  LYMPHEDEMA/ONCOLOGY QUESTIONNAIRE - 08/24/21 0001       Right Upper Extremity Lymphedema   15 cm Proximal to Olecranon Process 36.4 cm    10 cm Proximal to Olecranon Process 36 cm    Olecranon Process 26 cm      Left Upper Extremity Lymphedema   15 cm Proximal to Olecranon Process 36.2 cm    10 cm Proximal to Olecranon Process 35 cm    Olecranon Process 25 cm              Beckey Rutter, DNP, AGNP-C [place] at [facility] [phone] (clinic) 08/15/2021   Assessment and plan- Patient is a 66 y.o. female with clinical prognostic stage IIa T2 N0 M0 grade 3 invasive mammary carcinoma ER weakly positive, PR negative and HER2 positive.  She received 4 cycles of neoadjuvant TCHP chemotherapy with complete pathological response.  She is here for on treatment assessment prior to cycle 13 of adjuvant Herceptin and Perjeta.    Counts okay to proceed with cycle 13 of adjuvant Herceptin and Perjeta today which is her last cycle. We reviewed NCCN guidelines regarding surveillance. She declines surveillance visits with rad-onc and prefers to be followed by medical oncology. Will refer to Survivorship Clinic for her survivorship  care plan visit. REfer to CARE program for exercise post-treatment without restriction. Can trial acupuncture and/or massage to see if this improves her range of motion and comfort post operatively in affected breast. She will need yearly mammograms for surveillance and next due in April 2024.    Hemoglobin is slightly decreased. 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 Hemoglobin fractionation cascade pending.    April 2023 echocardiogram was normal as well.  Repeat echocardiogram in 6 months to monitor for post-treatment cardiac effects.    Port-a-cath- in place and functioning well. Plan to flush every 8-12 weeks.    Disposition: Treatment today RTC in 3 months for port/lab (cbc, cmp), Dr. Janese Banks    Visit Diagnosis 1. Malignant neoplasm of upper-outer quadrant of left breast in female, estrogen receptor positive ([place])   2. Encounter for monoclonal antibody treatment for malignancy     [facility]: Dr. Ouida Sills, Dr. Janese Banks      Patient Instructions by Verlon Au, NP at 08/15/2021 9:45 AM  Author: Verlon Au, NP Author Type: Nurse Practitioner Filed: 08/15/2021 10:31 AM  Note Status: Addendum Cosign: Cosign Not Required Encounter Date: 08/15/2021  Editor: Verlon Au, NP (Nurse Practitioner)      Prior Versions: 1. Verlon Au, NP (Nurse Practitioner) at 08/15/2021 10:30 AM - Signed    As we discussed, you may notice some improvement in your symptoms with acupuncture. Given your history of breast cancer, you may be eligible to have the cost of 6 sessions covered by a grant. I will contact your nurse navigators Al Pimple, Tanya Nones, and Casper Harrison who will request approval from the clinics and then the clinic will contact you to set up an appointment. Please let me know if you do not hear something from them in the next week.    - [place]- [place] [facility]) or [phone] Phillip Heal)   I've also sent referral to [facility] for a survivorship care plan visit to discuss resources available to you. I've also referred you to the CARE program which we discussed for OT/PT/exercise.           OT SCREEN 08/24/21: Patient refer for left breast lymphedema as well as scar tissue and tenderness.  Patient report left breast at times more swollen than other times.  Also tenderness 5/10.  Mostly in the bottom half of left breast.  Patient had lumpectomy as well as radiation.  Had 4 sessions of chemo prior to lumpectomy.  Patient works from home on computer.  Left breast little tighter and higher compared to the right.  No signs of lymphedema in left upper extremity  -patient is right-hand dominant. Patient was educated on manual massage for fibrosis and scar tissue.  To do twice a day.  As well as recommend for her to pick up a  jovipak unilateral postmastectomy breast pad to use under bra or camisole most of the time for about 2-3 wks and follow up. 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  Patient was educated in lymphedema signs and symptoms as well as precautions. Hand out provided.                         Patient will benefit from skilled therapeutic intervention in order to improve the following deficits and impairments:  Visit Diagnosis: Scar condition and fibrosis of skin    Problem List Patient Active Problem List   Diagnosis Date Noted   Genetic testing 08/11/2020   Wrist joint pain 07/12/2020   Radial styloid tenosynovitis 07/12/2020   Goals of care, counseling/discussion 07/12/2020   Malignant neoplasm of upper-outer quadrant of left breast in female, estrogen receptor positive ([place]) 07/12/2020   GERD (gastroesophageal reflux disease) 05/06/2020   Psoriasis 05/06/2020   Immunosuppression ([place]) 08/17/2016   History of esophageal stricture 08/16/2016   Severe obesity (BMI 35.0-39.9) with comorbidity ([place]) 01/20/2016   Microcytosis 02/19/2015    Rosalyn Gess, OTR/L,CLT 08/24/2021, 1:03 PM  [place] at [facility] [address] [place], [place], [address] Phone: [phone]   Fax:  [phone]  Name: Wendy [place] MRN: [id number] Date of Birth: [birth year]

## 2021-08-30 ENCOUNTER — Encounter: Payer: Self-pay | Admitting: Oncology

## 2021-08-30 ENCOUNTER — Encounter: Payer: Self-pay | Admitting: Occupational Therapy

## 2021-08-30 NOTE — Therapy (Signed)
[facility] at [facility], [place], [place], [address] Phone: [phone]   Fax:  [phone]   Occupational Therapy Screen:   Patient Details  Name: Wendy Caldwell MRN: [id number] Date of Birth: 1955-08-30 No data recorded   Encounter Date: 08/24/2021     OT End of Session - 08/24/21 1256       Visit Number 0                       Past Medical History:  Diagnosis Date   Family history of adverse reaction to anesthesia      adopted   GERD (gastroesophageal reflux disease)             Past Surgical History:  Procedure Laterality Date   BREAST BIOPSY Left 07/05/2020    [place] Bx, Q clip, [facility]   BREAST BIOPSY Left 07/05/2020    [place] Bx, Vision, benign breast tissue with increased stromal fibrosis   BREAST LUMPECTOMY       CARPAL TUNNEL RELEASE Bilateral     CHOLECYSTECTOMY       GANGLION CYST EXCISION        wrist   PART MASTECTOMY,RADIO FREQUENCY LOCALIZER,AXILLARY SENTINEL NODE BIOPSY Left 10/26/2020    Procedure: PART MASTECTOMY,RADIO FREQUENCY LOCALIZER,AXILLARY SENTINEL NODE BIOPSY;  Surgeon: Herbert Pun, MD;  Location: [facility] ORS;  Service: General;  Laterality: Left;   PORTACATH PLACEMENT Right 07/21/2020    Procedure: INSERTION PORT-A-CATH;  Surgeon: Herbert Pun, MD;  Location: [facility] ORS;  Service: General;  Laterality: Right;   right ovary removal          There were no vitals filed for this visit.     Subjective Assessment - 08/24/21 1253       Subjective  My L breast feels fuller , heavier and tender. At times more than others. Pain about 5/10 . My arm feels okay. 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 Had lumpectomy and radiation - stopped prior to surgery at 4 sessions of chemo - had reaction     Currently in Pain? Yes     Pain Score 5      Pain Location Breast     Pain Orientation Left     Pain Descriptors / Indicators Tightness;Tender     Pain Type Surgical pain     Pain  Onset More than a month ago     Pain Frequency Constant                           LYMPHEDEMA/ONCOLOGY QUESTIONNAIRE - 08/24/21 0001                Right Upper Extremity Lymphedema    15 cm Proximal to Olecranon Process 36.4 cm     10 cm Proximal to Olecranon Process 36 cm     Olecranon Process 26 cm          Left Upper Extremity Lymphedema    15 cm Proximal to Olecranon Process 36.2 cm     10 cm Proximal to Olecranon Process 35 cm     Olecranon Process 25 cm                     Beckey Rutter, DNP, AGNP-C [place] [facility] [phone] (clinic) 08/15/2021   Assessment and plan- Patient is a 66 y.o. 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  She received 4 cycles of neoadjuvant TCHP chemotherapy with complete pathological response.  She is here for on treatment assessment prior to cycle 13 of adjuvant Herceptin and Perjeta.    Counts okay to proceed with cycle 13 of adjuvant Herceptin and Perjeta today which is her last cycle. We reviewed NCCN guidelines regarding surveillance. She declines surveillance visits with rad-onc and prefers to be followed by medical oncology. Will refer to Survivorship [facility] for her survivorship care plan visit. REfer to CARE program for exercise post-treatment without restriction. Can trial acupuncture and/or massage to see if this improves her range of motion and comfort post operatively in affected breast. She will need yearly mammograms for surveillance and next due in April 2024.    Hemoglobin is slightly decreased. 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 Hemoglobin fractionation cascade pending.    April 2023 echocardiogram was normal as well.  Repeat echocardiogram in 6 months to monitor for post-treatment cardiac effects.    Port-a-cath- in place and functioning well. Plan to flush every  8-12 weeks.    Disposition: Treatment today RTC in 3 months for port/lab (cbc, cmp), Dr. Janese Banks   Visit Diagnosis 1. Malignant neoplasm of upper-outer quadrant of left breast in female, estrogen receptor positive ([place])   2. Encounter for monoclonal antibody treatment for malignancy     [facility]: Dr. Ouida Sills, Dr. Janese Banks       Patient Instructions by Verlon Au, NP at 08/15/2021 9:45 AM           Author: Verlon Au, NP Author Type: Nurse Practitioner Filed: 08/15/2021 10:31 AM   Note Status: Addendum Cosign: Cosign Not Required Encounter Date: 08/15/2021   Editor: Verlon Au, NP (Nurse Practitioner)           Prior Versions: 1. Verlon Au, NP (Nurse Practitioner) at 08/15/2021 10:30 AM - Signed     As we discussed, you may notice some improvement in your symptoms with acupuncture. Given your history of breast cancer, you may be eligible to have the cost of 6 sessions covered by a grant. I will contact your nurse navigators Al Pimple, Tanya Nones, and Casper Harrison who will request approval from the clinics and then the clinic will contact you to set up an appointment. Please let me know if you do not hear something from them in the next week.    - [place]) or [phone] Phillip Heal)   I've also sent referral to [facility] for a survivorship care plan visit to discuss resources available to you. I've also referred you to the CARE program which we discussed for OT/PT/exercise.              OT SCREEN 08/24/21: Patient refer for left breast lymphedema as well as scar tissue and tenderness.  Patient report left breast at times more swollen than other times.  Also tenderness 5/10.  Mostly in the bottom half of left breast.  Patient had lumpectomy as well as radiation.  Had 4 sessions of chemo prior to lumpectomy.  Patient works from home on  computer.  Left breast little tighter and higher compared to the right.  No signs of lymphedema in left upper extremity -patient is right-hand dominant. Patient was educated on manual massage for fibrosis and scar tissue.  To do twice a  day.  As well as recommend for her to pick up a  jovipak unilateral postmastectomy breast pad to use under bra or camisole most of the time for about 2-3 wks and follow up. 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  Patient was educated in lymphedema signs and symptoms as well as precautions. Hand out provided.                                                 Patient will benefit from skilled therapeutic intervention in order to improve the following deficits and impairments:       Visit Diagnosis: Scar condition and fibrosis of skin       Problem List     Patient Active Problem List    Diagnosis Date Noted   Genetic testing 08/11/2020   Wrist joint pain 07/12/2020   Radial styloid tenosynovitis 07/12/2020   Goals of care, counseling/discussion 07/12/2020   Malignant neoplasm of upper-outer quadrant of left breast in female, estrogen receptor positive ([place]) 07/12/2020   GERD (gastroesophageal reflux disease) 05/06/2020   Psoriasis 05/06/2020   Immunosuppression ([place]) 08/17/2016   History of esophageal stricture 08/16/2016   Severe obesity (BMI 35.0-39.9) with comorbidity ([place]) 01/20/2016   Microcytosis 02/19/2015      Rosalyn Gess, OTR/L,CLT 08/24/2021, 1:03 PM   [place] at [facility] [address] [place], [place], [address] Phone: [phone]   Fax:  [phone]   Name: Wendy Caldwell MRN: [id number] Date of Birth: 07-06-1955

## 2021-09-01 ENCOUNTER — Telehealth: Payer: Self-pay | Admitting: *Deleted

## 2021-09-01 NOTE — Telephone Encounter (Signed)
Wendy Caldwell had seen pt and she needs jovipak breast pad and compression sleeve with glove. All orders put in and faxed to [name]

## 2021-09-02 ENCOUNTER — Encounter: Payer: Self-pay | Admitting: Oncology

## 2021-09-04 ENCOUNTER — Encounter: Payer: Self-pay | Admitting: Nurse Practitioner

## 2021-09-05 ENCOUNTER — Inpatient Hospital Stay (HOSPITAL_BASED_OUTPATIENT_CLINIC_OR_DEPARTMENT_OTHER): Payer: BC Managed Care – PPO | Admitting: Medical Oncology

## 2021-09-05 ENCOUNTER — Encounter: Payer: Self-pay | Admitting: Oncology

## 2021-09-05 ENCOUNTER — Telehealth: Payer: Self-pay | Admitting: *Deleted

## 2021-09-05 VITALS — BP 116/75 | HR 77 | Temp 99.1°F | Resp 16

## 2021-09-05 DIAGNOSIS — Z5112 Encounter for antineoplastic immunotherapy: Secondary | ICD-10-CM | POA: Diagnosis not present

## 2021-09-05 DIAGNOSIS — L309 Dermatitis, unspecified: Secondary | ICD-10-CM

## 2021-09-05 DIAGNOSIS — R718 Other abnormality of red blood cells: Secondary | ICD-10-CM | POA: Diagnosis not present

## 2021-09-05 DIAGNOSIS — Z170 Estrogen receptor positive status [ER+]: Secondary | ICD-10-CM | POA: Diagnosis not present

## 2021-09-05 DIAGNOSIS — Z79899 Other long term (current) drug therapy: Secondary | ICD-10-CM | POA: Diagnosis not present

## 2021-09-05 DIAGNOSIS — C50412 Malignant neoplasm of upper-outer quadrant of left female breast: Secondary | ICD-10-CM | POA: Diagnosis not present

## 2021-09-05 MED ORDER — PREDNISONE 10 MG PO TABS: 10.0000 mg | ORAL_TABLET | Freq: Every day | ORAL | 0 refills | Status: AC

## 2021-09-05 MED ORDER — DESONIDE 0.05 % EX CREA: TOPICAL_CREAM | Freq: Two times a day (BID) | CUTANEOUS | 0 refills | Status: DC

## 2021-09-05 NOTE — Progress Notes (Signed)
Pt presents to [facility]c today with concern of facial rash. She reports that she woke up with rash this morning presenting around both eyes and upper cheek areas. She denies any itching or discomfort. States that she has a history of psoriasis, but has been off  Humira since starting chemo.

## 2021-09-05 NOTE — Telephone Encounter (Signed)
Order for port removal has been faxed to Dr.Cintrons office at [phone] and received fax confirmation.

## 2021-09-05 NOTE — Progress Notes (Signed)
Symptom Management Clinic [facility] at [facility] [phone] [phone]  Patient Care Team: Lauro Regulus, MD as PCP - General (Internal Medicine) Jim Like, RN as Oncology Nurse Navigator Carmina Miller, MD as Consulting Physician (Radiation Oncology) Creig Hines, MD as Consulting Physician (Oncology) Carolan Shiver, MD as Consulting Physician (General Surgery)   Name of the patient: Wendy Caldwell  [id number]  [birth year]   Date of visit: 09/05/21  Reason for Consult: Wendy Caldwell is a 66 y.o. female who presents today for:  Rash: Patient states that she woke up with a rash on her face.  She states that her face feels a bit tight but otherwise does not have any symptoms from the rash such as itching or pain.  The rash is located around her eyes and has a few scattered spots on her cheeks bilaterally.  No other rash of body.  She recently stopped a course of prednisone on Friday which was not related to this rash.  Other than that the only new product was a new eyeliner that she had used yesterday.  There is no reported fever, trouble breathing or trouble swallowing.  She has not tried anything for the rash yet.  Of note she does have a history of psoriasis.    PAST MEDICAL HISTORY: Past Medical History:  Diagnosis Date   Family history of adverse reaction to anesthesia    adopted   GERD (gastroesophageal reflux disease)     PAST SURGICAL HISTORY:  Past Surgical History:  Procedure Laterality Date   BREAST BIOPSY Left 07/05/2020   [place] Bx, Q clip, [facility]   BREAST BIOPSY Left 07/05/2020   [place] Bx, Vision, benign breast tissue with increased stromal fibrosis   BREAST LUMPECTOMY     CARPAL TUNNEL RELEASE Bilateral    CHOLECYSTECTOMY     GANGLION CYST EXCISION     wrist   PART MASTECTOMY,RADIO FREQUENCY LOCALIZER,AXILLARY SENTINEL NODE BIOPSY Left 10/26/2020   Procedure: PART MASTECTOMY,RADIO FREQUENCY  LOCALIZER,AXILLARY SENTINEL NODE BIOPSY;  Surgeon: Carolan Shiver, MD;  Location: [facility] ORS;  Service: General;  Laterality: Left;   PORTACATH PLACEMENT Right 07/21/2020   Procedure: INSERTION PORT-A-CATH;  Surgeon: Carolan Shiver, MD;  Location: [facility] ORS;  Service: General;  Laterality: Right;   right ovary removal      HEMATOLOGY/ONCOLOGY HISTORY:  Oncology History  Malignant neoplasm of upper-outer quadrant of left breast in female, estrogen receptor positive (HCC)  07/12/2020 Initial Diagnosis   Malignant neoplasm of upper-outer quadrant of left breast in female, estrogen receptor positive ([facility])   07/12/2020 Cancer Staging   Staging form: Breast, AJCC 8th Edition - Clinical stage from 07/12/2020: Stage IIA (cT2, cN0, cM0, G3, ER+, PR-, HER2+) - Signed by Creig Hines, MD on 07/12/2020 Stage prefix: Initial diagnosis Histologic grading system: 3 grade system   07/23/2020 - 09/30/2020 Chemotherapy         08/05/2020 Genetic Testing   Negative genetic testing. No pathogenic variants identified on the [facility] CancerNext-Expanded+RNA panel. VUS in APC called c.2218G>C, c.3628C>T, c.6779G>A and VUS in CHEK2 called c.1003G>C identified. The report date is 08/05/2020.  The CancerNext-Expanded + RNAinsight gene panel offered by [employer] and includes sequencing and rearrangement analysis for the following 77 genes: IP, ALK, APC*, ATM*, AXIN2, BAP1, BARD1, BLM, BMPR1A, BRCA1*, BRCA2*, BRIP1*, CDC73, CDH1*,CDK4, CDKN1B, CDKN2A, CHEK2*, CTNNA1, DICER1, FANCC, FH, FLCN, GALNT12, KIF1B, LZTR1, MAX, MEN1, MET, MLH1*, MSH2*, MSH3, MSH6*, MUTYH*, NBN, NF1*, NF2, NTHL1, PALB2*, PHOX2B, PMS2*, POT1,  PRKAR1A, PTCH1, PTEN*, RAD51C*, RAD51D*,RB1, RECQL, RET, SDHA, SDHAF2, SDHB, SDHC, SDHD, SMAD4, SMARCA4, SMARCB1, SMARCE1, STK11, SUFU, TMEM127, TP53*,TSC1, TSC2, VHL and XRCC2 (sequencing and deletion/duplication); EGFR, EGLN1, HOXB13, KIT, MITF, PDGFRA, POLD1 and POLE (sequencing only); EPCAM and GREM1  (deletion/duplication only).   11/12/2020 -  Chemotherapy   Patient is on Treatment Plan : BREAST Trastuzumab  + Pertuzumab q21d x 13 cycles       ALLERGIES:  has No Known Allergies.  MEDICATIONS:  Current Outpatient Medications  Medication Sig Dispense Refill   desonide (DESOWEN) 0.05 % cream Apply topically 2 (two) times daily. 30 g 0   predniSONE (DELTASONE) 10 MG tablet Take 1 tablet (10 mg total) by mouth daily with breakfast for 7 days. 7 tablet 0   azelastine (ASTELIN) 0.1 % nasal spray Place 2 sprays into both nostrils 2 (two) times daily. Use in each nostril as directed (Patient not taking: Reported on 07/04/2021) 30 mL 2   benzonatate (TESSALON) 100 MG capsule Take 100 mg by mouth daily as needed for cough. (Patient not taking: Reported on 07/04/2021)     cyclobenzaprine (FLEXERIL) 5 MG tablet Take 1-2 tablets (5-10 mg total) by mouth 3 (three) times daily as needed for muscle spasms. 30 tablet 0   Lifitegrast (XIIDRA) 5 % SOLN Apply to eye 2 (two) times daily.     loratadine-pseudoephedrine (CLARITIN-D 24 HOUR) 10-240 MG 24 hr tablet Take 1 tablet by mouth daily. (Patient not taking: Reported on 10/29/2020) 30 tablet 0   Multiple Vitamins-Minerals (EYE-VITE PLUS LUTEIN PO) Take 1 capsule by mouth 2 (two) times daily. AREDS2     Multiple Vitamins-Minerals (MULTIVITAMIN WITH MINERALS) tablet Take 1 tablet by mouth daily.     omeprazole (PRILOSEC) 40 MG capsule Take 1 capsule (40 mg total) by mouth daily. 30 capsule 0   ondansetron (ZOFRAN-ODT) 4 MG disintegrating tablet Take 4 mg by mouth daily as needed for nausea/vomiting. (Patient not taking: Reported on 10/29/2020)     Polyethylene Glycol 400 (BLINK TEARS) 0.25 % SOLN Apply 1 drop to eye 5 (five) times daily as needed. Every 2 hrs daily     triamcinolone ointment (KENALOG) 0.5 % Apply 1 application topically 2 (two) times daily. (Patient not taking: Reported on 10/29/2020) 30 g 0   vitamin B-12 (CYANOCOBALAMIN) 1000 MCG tablet Take  1,000 mcg by mouth daily.     vitamin C (ASCORBIC ACID) 500 MG tablet Take 500 mg by mouth daily.     vitamin E 180 MG (400 UNITS) capsule Take 400 Units by mouth daily.     No current facility-administered medications for this visit.   Facility-Administered Medications Ordered in Other Visits  Medication Dose Route Frequency Provider Last Rate Last Admin   acetaminophen (TYLENOL) 325 MG tablet            diphenhydrAMINE (BENADRYL) 25 mg capsule             VITAL SIGNS: BP 116/75   Pulse 77   Temp 99.1 F (37.3 C) (Tympanic)   Resp 16   SpO2 100%  There were no vitals filed for this visit.  Estimated body mass index is 31.53 kg/m as calculated from the following:   Height as of 03/21/21: 5\' 4"  (1.626 m).   Weight as of 08/15/21: 183 lb 11.2 oz (83.3 kg).  LABS: CBC:    Component Value Date/Time   WBC 8.3 08/15/2021 0909   HGB 11.9 (L) 08/15/2021 0909   HCT 37.4 08/15/2021 0909  PLT 279 08/15/2021 0909   MCV 63.2 (L) 08/15/2021 0909   NEUTROABS 5.6 08/15/2021 0909   LYMPHSABS 1.6 08/15/2021 0909   MONOABS 0.7 08/15/2021 0909   EOSABS 0.3 08/15/2021 0909   BASOSABS 0.0 08/15/2021 0909   Comprehensive Metabolic Panel:    Component Value Date/Time   NA 139 08/15/2021 0909   K 3.7 08/15/2021 0909   CL 105 08/15/2021 0909   CO2 26 08/15/2021 0909   BUN 14 08/15/2021 0909   CREATININE 0.82 08/15/2021 0909   GLUCOSE 112 (H) 08/15/2021 0909   CALCIUM 8.8 (L) 08/15/2021 0909   AST 22 08/15/2021 0909   ALT 23 08/15/2021 0909   ALKPHOS 101 08/15/2021 0909   BILITOT 0.6 08/15/2021 0909   PROT 7.3 08/15/2021 0909   ALBUMIN 3.6 08/15/2021 0909    RADIOGRAPHIC STUDIES: No results found.  PERFORMANCE STATUS (ECOG) : 1 - Symptomatic but completely ambulatory  Review of Systems Unless otherwise noted, a complete review of systems is negative.  Physical Exam General: NAD Cardiovascular: regular rate and rhythm Pulmonary: clear ant fields Abdomen: soft, nontender, +  bowel sounds GU: no suprapubic tenderness Extremities: no edema, no joint deformities Skin: Scattered poorly demarcated slightly erythematic flat rash of face mainly encompassing the periorbital area bilaterally.  No discharge, flaking or scaling Neurological: Weakness but otherwise nonfocal  Assessment and Plan- Patient is a 66 y.o. female    Encounter Diagnosis  Name Primary?   Periorbital dermatitis Yes   New.  Appears to be periorbital dermatitis secondary to allergic reaction from her new eyeliner pencil.  She will avoid the use of this for now.  She is about to go out of town on a vacation and she is concerned about taking medications for this rash.  We discussed 3 options 1 being for her to use nothing on the face and to allow her body to have this rash resolve on its own, 2 use a low potency steroid safe for the face, 3 use a very low potency oral steroid.  Because patient is planning on going on vacation she is not sure if she would like to prefer to use the steroid for her face or the low potency oral steroid so we have elected to send in both and she will decide which she prefers to use knowing the risks and benefits of each.  We discussed red flag signs and symptoms.  Follow-up as needed.    Patient expressed understanding and was in agreement with this plan. She also understands that She can call clinic at any time with any questions, concerns, or complaints.   Thank you for allowing me to participate in the care of this very pleasant patient.   Time Total: 15  Visit consisted of counseling and education dealing with the complex and emotionally intense issues of symptom management in the setting of serious illness.Greater than 50%  of this time was spent counseling and coordinating care related to the above assessment and plan.  Signed by: Clent Jacks, PA-C

## 2021-09-23 DIAGNOSIS — Z0100 Encounter for examination of eyes and vision without abnormal findings: Secondary | ICD-10-CM | POA: Diagnosis not present

## 2021-09-23 DIAGNOSIS — M3501 Sicca syndrome with keratoconjunctivitis: Secondary | ICD-10-CM | POA: Diagnosis not present

## 2021-09-28 DIAGNOSIS — Z95828 Presence of other vascular implants and grafts: Secondary | ICD-10-CM | POA: Diagnosis not present

## 2021-10-03 ENCOUNTER — Other Ambulatory Visit: Payer: Self-pay

## 2021-10-18 DIAGNOSIS — R718 Other abnormality of red blood cells: Secondary | ICD-10-CM | POA: Diagnosis not present

## 2021-10-18 DIAGNOSIS — L409 Psoriasis, unspecified: Secondary | ICD-10-CM | POA: Diagnosis not present

## 2021-10-18 DIAGNOSIS — E669 Obesity, unspecified: Secondary | ICD-10-CM | POA: Diagnosis not present

## 2021-10-18 DIAGNOSIS — Z131 Encounter for screening for diabetes mellitus: Secondary | ICD-10-CM | POA: Diagnosis not present

## 2021-10-18 DIAGNOSIS — Z13220 Encounter for screening for lipoid disorders: Secondary | ICD-10-CM | POA: Diagnosis not present

## 2021-10-21 ENCOUNTER — Other Ambulatory Visit: Payer: Self-pay | Admitting: Internal Medicine

## 2021-10-21 DIAGNOSIS — N289 Disorder of kidney and ureter, unspecified: Secondary | ICD-10-CM

## 2021-10-24 ENCOUNTER — Ambulatory Visit
Admission: RE | Admit: 2021-10-24 | Discharge: 2021-10-24 | Disposition: A | Discharge status: Home / Self Care | Payer: BC Managed Care – PPO | Source: Ambulatory Visit | Attending: Internal Medicine | Admitting: Internal Medicine

## 2021-10-24 DIAGNOSIS — N289 Disorder of kidney and ureter, unspecified: Secondary | ICD-10-CM

## 2021-10-24 DIAGNOSIS — N2889 Other specified disorders of kidney and ureter: Secondary | ICD-10-CM | POA: Diagnosis not present

## 2021-10-25 DIAGNOSIS — M3501 Sicca syndrome with keratoconjunctivitis: Secondary | ICD-10-CM | POA: Diagnosis not present

## 2021-10-28 DIAGNOSIS — N289 Disorder of kidney and ureter, unspecified: Secondary | ICD-10-CM | POA: Diagnosis not present

## 2021-11-04 DIAGNOSIS — H18893 Other specified disorders of cornea, bilateral: Secondary | ICD-10-CM | POA: Diagnosis not present

## 2021-11-05 ENCOUNTER — Encounter: Payer: Self-pay | Admitting: Oncology

## 2021-11-07 NOTE — Telephone Encounter (Signed)
Keep appointment. Cancel port labs. If labs are ordered do them peripherally

## 2021-11-09 DIAGNOSIS — H16141 Punctate keratitis, right eye: Secondary | ICD-10-CM | POA: Diagnosis not present

## 2021-11-09 DIAGNOSIS — M3501 Sicca syndrome with keratoconjunctivitis: Secondary | ICD-10-CM | POA: Diagnosis not present

## 2021-11-14 ENCOUNTER — Other Ambulatory Visit: Payer: Self-pay | Admitting: *Deleted

## 2021-11-14 DIAGNOSIS — Z170 Estrogen receptor positive status [ER+]: Secondary | ICD-10-CM

## 2021-11-15 ENCOUNTER — Inpatient Hospital Stay (HOSPITAL_BASED_OUTPATIENT_CLINIC_OR_DEPARTMENT_OTHER): Payer: BC Managed Care – PPO | Admitting: Oncology

## 2021-11-15 ENCOUNTER — Encounter: Payer: Self-pay | Admitting: Oncology

## 2021-11-15 ENCOUNTER — Inpatient Hospital Stay: Payer: BC Managed Care – PPO | Attending: Oncology

## 2021-11-15 VITALS — BP 112/65 | HR 73 | Temp 97.9°F | Resp 16 | Wt 187.0 lb

## 2021-11-15 DIAGNOSIS — C773 Secondary and unspecified malignant neoplasm of axilla and upper limb lymph nodes: Secondary | ICD-10-CM | POA: Insufficient documentation

## 2021-11-15 DIAGNOSIS — H16141 Punctate keratitis, right eye: Secondary | ICD-10-CM | POA: Diagnosis not present

## 2021-11-15 DIAGNOSIS — Z170 Estrogen receptor positive status [ER+]: Secondary | ICD-10-CM | POA: Diagnosis not present

## 2021-11-15 DIAGNOSIS — Z853 Personal history of malignant neoplasm of breast: Secondary | ICD-10-CM

## 2021-11-15 DIAGNOSIS — D563 Thalassemia minor: Secondary | ICD-10-CM

## 2021-11-15 DIAGNOSIS — C50912 Malignant neoplasm of unspecified site of left female breast: Secondary | ICD-10-CM | POA: Insufficient documentation

## 2021-11-15 DIAGNOSIS — Z08 Encounter for follow-up examination after completed treatment for malignant neoplasm: Secondary | ICD-10-CM

## 2021-11-15 LAB — CBC WITH DIFFERENTIAL/PLATELET
Abs Immature Granulocytes: 0.03 10*3/uL (ref 0.00–0.07)
Basophils Absolute: 0.1 10*3/uL (ref 0.0–0.1)
Basophils Relative: 1 %
Eosinophils Absolute: 0.2 10*3/uL (ref 0.0–0.5)
Eosinophils Relative: 3 %
HCT: 39.5 % (ref 36.0–46.0)
Hemoglobin: 12.4 g/dL (ref 12.0–15.0)
Immature Granulocytes: 0 %
Lymphocytes Relative: 26 %
Lymphs Abs: 1.9 10*3/uL (ref 0.7–4.0)
MCH: 20.2 pg — ABNORMAL LOW (ref 26.0–34.0)
MCHC: 31.4 g/dL (ref 30.0–36.0)
MCV: 64.2 fL — ABNORMAL LOW (ref 80.0–100.0)
Monocytes Absolute: 0.6 10*3/uL (ref 0.1–1.0)
Monocytes Relative: 8 %
Neutro Abs: 4.8 10*3/uL (ref 1.7–7.7)
Neutrophils Relative %: 62 %
Platelets: 293 10*3/uL (ref 150–400)
RBC: 6.15 MIL/uL — ABNORMAL HIGH (ref 3.87–5.11)
RDW: 15.5 % (ref 11.5–15.5)
WBC: 7.6 10*3/uL (ref 4.0–10.5)
nRBC: 0 % (ref 0.0–0.2)

## 2021-11-15 LAB — COMPREHENSIVE METABOLIC PANEL
ALT: 20 U/L (ref 0–44)
AST: 21 U/L (ref 15–41)
Albumin: 3.9 g/dL (ref 3.5–5.0)
Alkaline Phosphatase: 74 U/L (ref 38–126)
Anion gap: 9 (ref 5–15)
BUN: 20 mg/dL (ref 8–23)
CO2: 25 mmol/L (ref 22–32)
Calcium: 9.1 mg/dL (ref 8.9–10.3)
Chloride: 105 mmol/L (ref 98–111)
Creatinine, Ser: 0.87 mg/dL (ref 0.44–1.00)
GFR, Estimated: >60 mL/min (ref >60)
Glucose, Bld: 106 mg/dL — ABNORMAL HIGH (ref 70–99)
Potassium: 4 mmol/L (ref 3.5–5.1)
Sodium: 139 mmol/L (ref 135–145)
Total Bilirubin: 0.7 mg/dL (ref 0.3–1.2)
Total Protein: 7.4 g/dL (ref 6.5–8.1)

## 2021-11-15 NOTE — Progress Notes (Signed)
Pt states she is having toruble getting the "dietary" medication seems to be on backorder.WEGOVY and SEXENDA. Will like to discuss suggestions on how to get her weight off from chemo.

## 2021-11-15 NOTE — Progress Notes (Signed)
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  She self palpated a left breast mass which prompted a bilateral diagnostic mammogram on 06/25/2020.  That showed an irregular hypoechoic mass 2.5 x 1.7 x 1.8 cm at 1 o'clock position 1 cm from the nipple.  3 cm from the nipple were benign cysts.  1 mildly enlarged left axillary lymph node with cortical thickening 4 mm.  Both the breast mass and the lymph node were biopsied.  Left breast biopsy was consistent with invasive mammary carcinoma grade 3 ER weakly +1 to 10%, PR negative and HER2 positive by IHC.   Patient completed 4 cycles of neoadjuvant TCHP chemotherapy which was complicated by worsening skin rash and fatigue.  Patient did not wish to proceed with 2 further cycles of chemotherapy and proceeded with lumpectomy and sentinel lymph node biopsy.  Final pathology showed complete pathological response on 10/26/2020.  2 sentinel lymph nodes negative for malignancy.   Patient is completed adjuvant radiation treatment and is currently on adjuvant Herceptin and Perjeta.  She does not wish to take endocrine therapy for weakly ER positive tumor  Interval  history-patient has been having ongoing symptoms of dry eyes for which she sees ophthalmology and uses multiple topical eyedrops.  Denies any specific breast concerns.  Denies any new aches and pains anywhere.  She is also trying to start medications which will make her lose weight  ECOG PS- 1 Pain scale- 0 Opioid associated constipation- no  Review of systems- Review of Systems  Constitutional:  Negative for chills, fever, malaise/fatigue and weight loss.  HENT:  Negative for congestion, ear discharge and nosebleeds.   Eyes:  Negative for blurred vision.       Dry eyes  Respiratory:  Negative for cough, hemoptysis, sputum production, shortness of breath and wheezing.   Cardiovascular:  Negative for chest pain, palpitations, orthopnea and claudication.  Gastrointestinal:  Negative for abdominal pain, blood in stool, constipation, diarrhea, heartburn, melena, nausea and vomiting.  Genitourinary:  Negative for dysuria, flank pain, frequency, hematuria and urgency.  Musculoskeletal:  Negative for back pain, joint pain and myalgias.  Skin:  Negative for rash.  Neurological:  Negative for dizziness, tingling, focal weakness, seizures, weakness and headaches.  Endo/Heme/Allergies:  Does not bruise/bleed easily.  Psychiatric/Behavioral:  Negative for depression and suicidal ideas. The patient does not have insomnia.       No Known Allergies   Past Medical History:  Diagnosis Date   Family history of adverse reaction to anesthesia    adopted   GERD (gastroesophageal reflux disease)      Past Surgical History:  Procedure Laterality Date   BREAST BIOPSY Left 07/05/2020   [place] Bx, Q clip, [facility]   BREAST BIOPSY Left 07/05/2020   [place] Bx, [facility],  benign breast tissue with increased stromal fibrosis   BREAST LUMPECTOMY     CARPAL TUNNEL RELEASE Bilateral    CHOLECYSTECTOMY     GANGLION CYST EXCISION     wrist   PART MASTECTOMY,RADIO FREQUENCY LOCALIZER,AXILLARY SENTINEL NODE BIOPSY Left  10/26/2020   Procedure: PART MASTECTOMY,RADIO FREQUENCY LOCALIZER,AXILLARY SENTINEL NODE BIOPSY;  Surgeon: Herbert Pun, MD;  Location: [facility] ORS;  Service: General;  Laterality: Left;   PORTACATH PLACEMENT Right 07/21/2020   Procedure: INSERTION PORT-A-CATH;  Surgeon: Herbert Pun, MD;  Location: [facility] ORS;  Service: General;  Laterality: Right;   right ovary removal      Social History   Socioeconomic History   Marital status: Married    Spouse name: Not on file   Number of children: Not on file   Years of education: Not on file   Highest education level: Not on file  Occupational History   Not on file  Tobacco Use   Smoking status: Former    Types: Cigarettes    Start date: 2012   Smokeless tobacco: Never  Vaping Use   Vaping Use: Never used  Substance and Sexual Activity   Alcohol use: Yes    Comment: occassional    Drug use: Never   Sexual activity: Not on file  Other Topics Concern   Not on file  Social History Narrative   Not on file   Social Determinants of Health   Financial Resource Strain: Not on file  Food Insecurity: Not on file  Transportation Needs: Not on file  Physical Activity: Not on file  Stress: Not on file  Social Connections: Not on file  Intimate Partner Violence: Not on file    Family History  Adopted: Yes  Problem Relation Age of Onset   Breast cancer Neg Hx      Current Outpatient Medications:    cyclobenzaprine (FLEXERIL) 5 MG tablet, Take 1-2 tablets (5-10 mg total) by mouth 3 (three) times daily as needed for muscle spasms., Disp: 30 tablet, Rfl: 0   desonide (DESOWEN) 0.05 % cream, Apply topically 2 (two) times daily., Disp: 30 g, Rfl: 0   Lifitegrast (XIIDRA) 5 % SOLN, Apply to eye 2 (two) times daily., Disp: , Rfl:    Multiple Vitamins-Minerals (EYE-VITE PLUS LUTEIN PO), Take 1 capsule by mouth 2 (two) times daily. AREDS2, Disp: , Rfl:    Multiple Vitamins-Minerals (MULTIVITAMIN WITH MINERALS) tablet, Take 1  tablet by mouth daily., Disp: , Rfl:    omeprazole (PRILOSEC) 40 MG capsule, Take 1 capsule (40 mg total) by mouth daily., Disp: 30 capsule, Rfl: 0   Polyethylene Glycol 400 (BLINK TEARS) 0.25 % SOLN, Apply 1 drop to eye 5 (five) times daily as needed. Every 2 hrs daily, Disp: , Rfl:    vitamin B-12 (CYANOCOBALAMIN) 1000 MCG tablet, Take 1,000 mcg by mouth daily., Disp: , Rfl:    vitamin C (ASCORBIC ACID) 500 MG tablet, Take 500 mg by mouth daily., Disp: , Rfl:    vitamin E 180 MG (400 UNITS) capsule, Take 400 Units by mouth daily., Disp: , Rfl:    azelastine (ASTELIN) 0.1 % nasal spray, Place 2 sprays into both nostrils 2 (two) times daily. Use in each nostril as directed (Patient not taking: Reported on 07/04/2021), Disp: 30 mL, Rfl: 2   benzonatate (TESSALON) 100 MG capsule, Take 100 mg by mouth daily as needed for cough. (Patient not taking: Reported on 07/04/2021), Disp: , Rfl:    loratadine-pseudoephedrine (CLARITIN-D 24 HOUR) 10-240 MG 24  hr tablet, Take 1 tablet by mouth daily. (Patient not taking: Reported on 10/29/2020), Disp: 30 tablet, Rfl: 0   ondansetron (ZOFRAN-ODT) 4 MG disintegrating tablet, Take 4 mg by mouth daily as needed for nausea/vomiting. (Patient not taking: Reported on 10/29/2020), Disp: , Rfl:    triamcinolone ointment (KENALOG) 0.5 %, Apply 1 application topically 2 (two) times daily. (Patient not taking: Reported on 10/29/2020), Disp: 30 g, Rfl: 0 No current facility-administered medications for this visit.  Facility-Administered Medications Ordered in Other Visits:    acetaminophen (TYLENOL) 325 MG tablet, , , ,    diphenhydrAMINE (BENADRYL) 25 mg capsule, , , ,   Physical exam:  Vitals:   11/15/21 1018  BP: 112/65  Pulse: 73  Resp: 16  Temp: 97.9 F (36.6 C)  SpO2: 96%  Weight: 187 lb (84.8 kg)   Physical Exam Constitutional:      General: She is not in acute distress. Cardiovascular:     Rate and Rhythm: Normal rate and regular rhythm.     Heart sounds:  Normal heart sounds.  Pulmonary:     Effort: Pulmonary effort is normal.     Breath sounds: Normal breath sounds.  Skin:    General: Skin is warm and dry.  Neurological:     Mental Status: She is alert and oriented to person, place, and time.    Breast exam was performed in seated and lying down position. Patient is status post left lumpectomy with a well-healed surgical scar. No evidence of any palpable masses.  Left nipple is surgically absent.  No evidence of axillary adenopathy. No evidence of any palpable masses or lumps in the right breast. No evidence of right axillary adenopathy      Latest Ref Rng & Units 11/15/2021   10:04 AM  CMP  Glucose 70 - 99 mg/dL 106   BUN 8 - 23 mg/dL 20   Creatinine 0.44 - 1.00 mg/dL 0.87   Sodium 135 - 145 mmol/L 139   Potassium 3.5 - 5.1 mmol/L 4.0   Chloride 98 - 111 mmol/L 105   CO2 22 - 32 mmol/L 25   Calcium 8.9 - 10.3 mg/dL 9.1   Total Protein 6.5 - 8.1 g/dL 7.4   Total Bilirubin 0.3 - 1.2 mg/dL 0.7   Alkaline Phos 38 - 126 U/L 74   AST 15 - 41 U/L 21   ALT 0 - 44 U/L 20       Latest Ref Rng & Units 11/15/2021   10:04 AM  CBC  WBC 4.0 - 10.5 K/uL 7.6   Hemoglobin 12.0 - 15.0 g/dL 12.4   Hematocrit 36.0 - 46.0 % 39.5   Platelets 150 - 400 K/uL 293     No images are attached to the encounter.  US RENAL  Result Date: 10/25/2021 CLINICAL DATA:  Worsening renal function EXAM: RENAL / URINARY TRACT ULTRASOUND COMPLETE COMPARISON:  None Available. FINDINGS: Right Kidney: Renal measurements: 10.7 x 3.3 x 3.0 cm = volume: 56 mL. Echogenicity within normal limits. No mass or hydronephrosis visualized. Left Kidney: Renal measurements: 10.8 x 5.1 x 4.6 cm = volume: 135 mL. Echogenicity within normal limits. No mass or hydronephrosis visualized. Bladder: Appears normal for degree of bladder distention. Other: None. IMPRESSION: No abnormalities are identified.  No hydronephrosis. Electronically Signed   By: Dorise Bullion III M.D.   On:  10/25/2021 08:57     Assessment and plan- Patient is a 66 y.o. female with clinical prognostic stage IIa T2 N0  M0 grade 3 invasive mammary carcinoma ER weakly positive, PR negative and HER2 positive.  She received 4 cycles of neoadjuvant TCHP chemotherapy with complete pathological response.  She is s/p adjuvant Herceptin and Perjeta and this is a routine f/u visit  Clinically patient is doing well with no concerning signs and symptoms of recurrence based on today's exam.  Her mammogram from April 2023 was unremarkable.  She had weakly ER positive PR negative disease and decided not to pursue endocrine therapy.  I will obtain a repeat echocardiogram next month which would be 6 months from her prior scan since she received trastuzumab and pertuzumab in the past.  Clinically no signs and symptoms of heart failure.  I will see her back in 4 months no labs.  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  Her hemoglobin is normal and therefore she does not require any further management for this.  I have asked her to inform her family so they can get tested if they have evidence of anemia as well.  Dry eyes: Possibly secondary to chemotherapy.  She is following up with ophthalmology for this   Visit Diagnosis 1. Encounter for follow-up surveillance of breast cancer   2. Beta thalassemia minor      Dr. Randa Evens, MD, MPH [facility] [phone] 11/15/2021 12:51 PM

## 2021-11-17 DIAGNOSIS — H16233 Neurotrophic keratoconjunctivitis, bilateral: Secondary | ICD-10-CM | POA: Diagnosis not present

## 2021-11-30 ENCOUNTER — Encounter: Payer: Self-pay | Admitting: Oncology

## 2021-12-04 IMAGING — MR MR BREAST BX W/ LOC DEV 1ST LEASION IMAGE BX SPEC MR GUIDE*R*
8 of 12 series · 32 of 48 positions shown · IV contrast (8 ml gadovist)
Comparison: Previous exams.
COMPARISON: Previous exams.

Addendum:
CLINICAL DATA: 64-year-old female with recent diagnosis of invasive
mammary carcinoma in the retroareolar left breast presents for MRI
guided biopsy a second 10 mm mass in the upper slightly outer left
breast and and a 12 mm area of linear enhancement in the
retroareolar right breast anterior to mid depth.

EXAM:
MRI GUIDED CORE NEEDLE BIOPSY OF THE BILATERAL BREASTS
TECHNIQUE: Multiplanar, multisequence MR imaging of the bilateral breasts was
performed both before and after administration of intravenous
contrast.
CONTRAST:  8 mL Gadavist.

[Series 2: fiducial bilateral · sagittal · 2.0mm · 1.33mm/px · 4 of 160 slices shown]
[im 1/160]
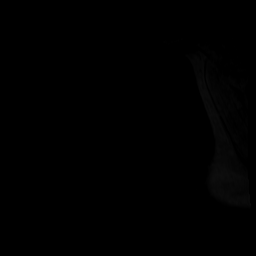
[im 54/160]
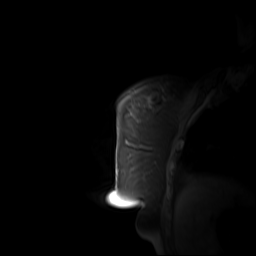
[im 107/160]
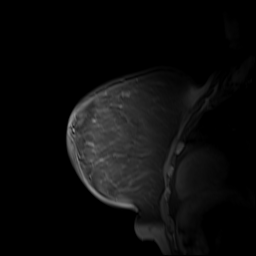
[im 160/160]
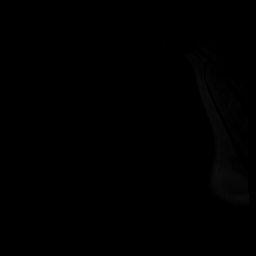

[Series 3: dynamic pre · axial · non-contrast · 1.3mm · 0.73mm/px · z∈[-106,+80]mm · 4 of 144 slices shown]
[im 1/144]
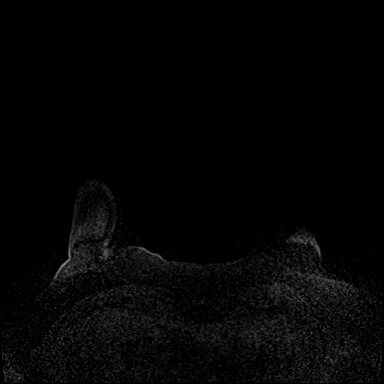
[im 48/144]
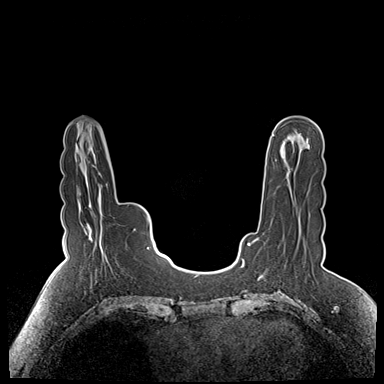
[im 96/144]
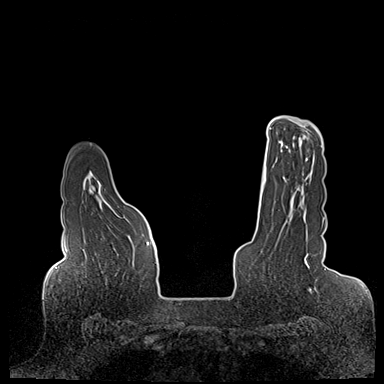
[im 144/144]
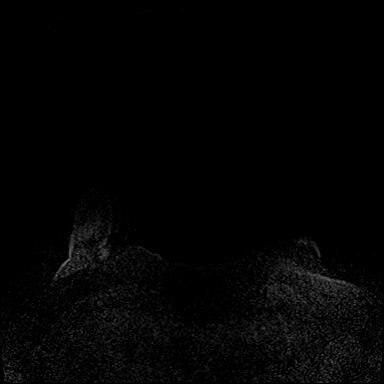

[Series 4: dynamic post 20 · axial · 1.3mm · 0.73mm/px · z∈[-106,+80]mm · 4 of 144 slices shown (1 of 2)]
[im 1/144]
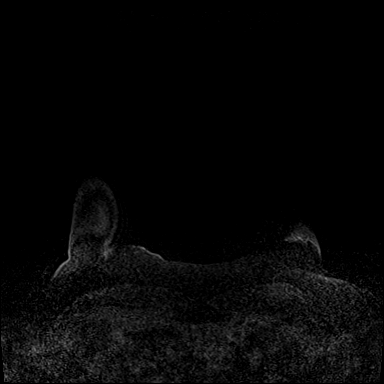
[im 48/144]
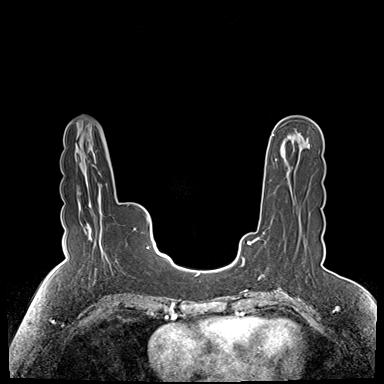
[im 96/144]
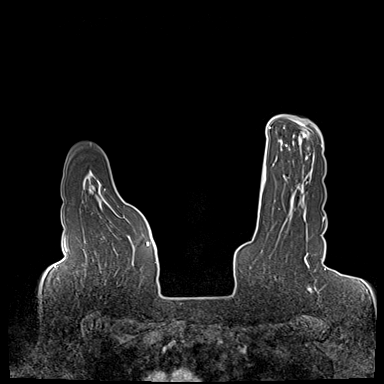
[im 144/144]
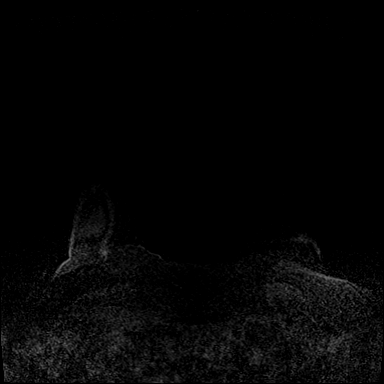

[Series 5: dynamic post 20 · axial · 1.3mm · 0.73mm/px · z∈[-106,+80]mm · 4 of 144 slices shown (2 of 2)]
[im 1/144]
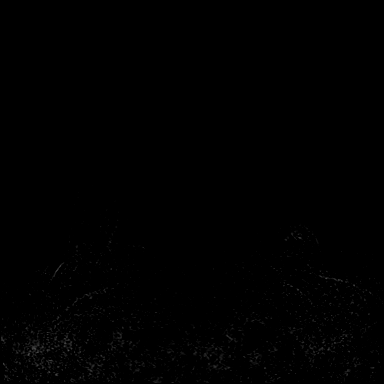
[im 48/144]
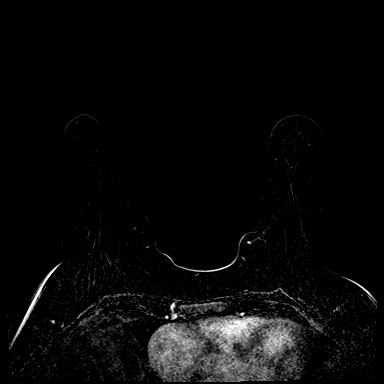
[im 96/144]
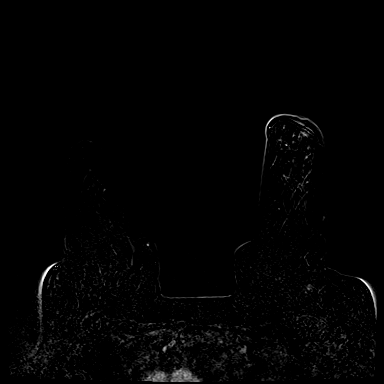
[im 144/144]
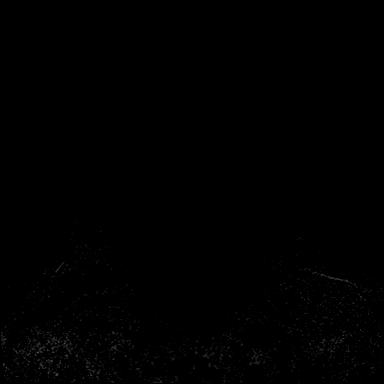

[Series 6: dynamic post 3 · axial · 1.3mm · 0.73mm/px · z∈[-106,+80]mm · 4 of 143 slices shown (1 of 2)]
[im 1/143]
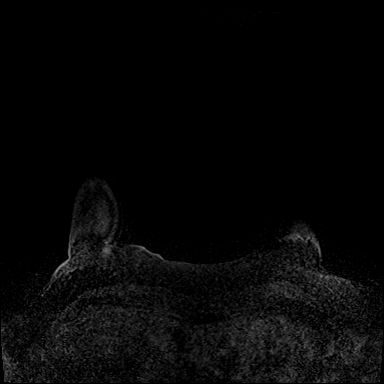
[im 48/143]
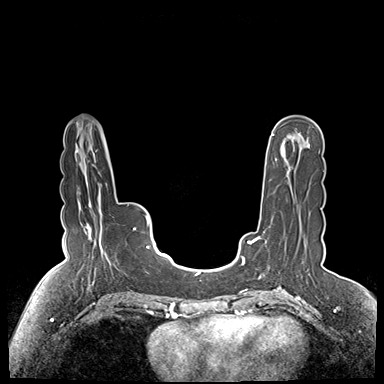
[im 95/143]
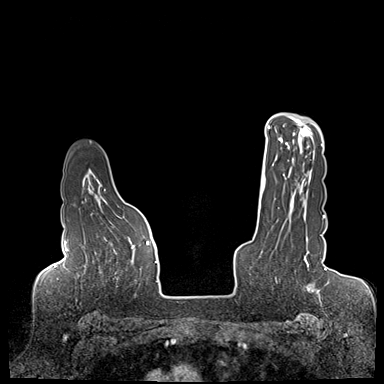
[im 143/143]
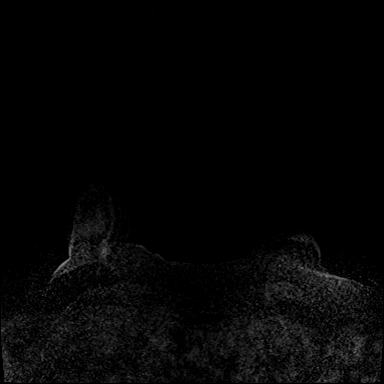

[Series 7: dynamic post 3 · axial · 1.3mm · 0.73mm/px · z∈[-106,+80]mm · 4 of 144 slices shown (2 of 2)]
[im 1/144]
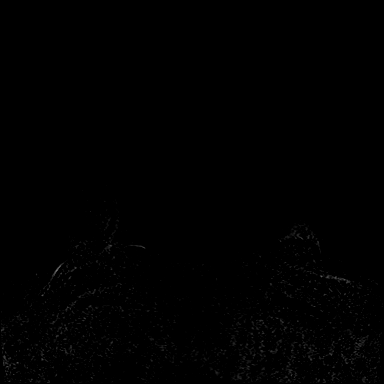
[im 48/144]
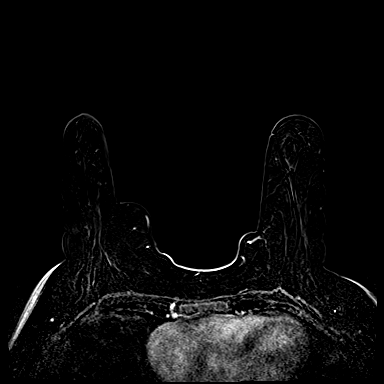
[im 96/144]
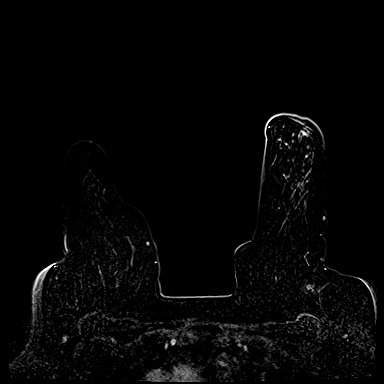
[im 144/144]
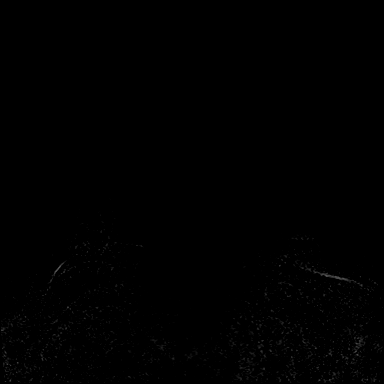

[Series 8: needle confirmation · axial · 1.3mm · 0.73mm/px · z∈[-106,+80]mm · 4 of 144 slices shown]
[im 1/144]
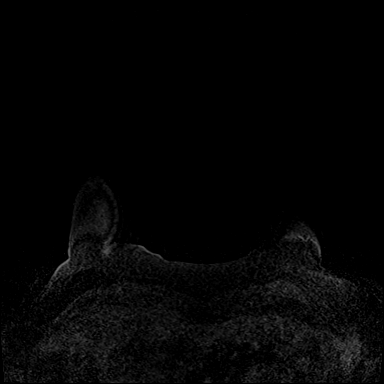
[im 48/144]
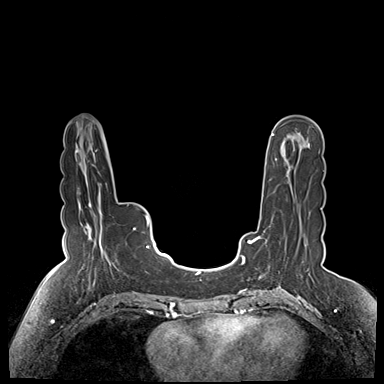
[im 96/144]
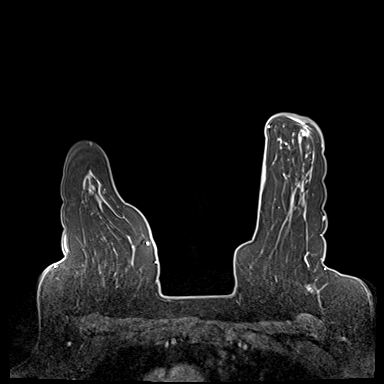
[im 144/144]
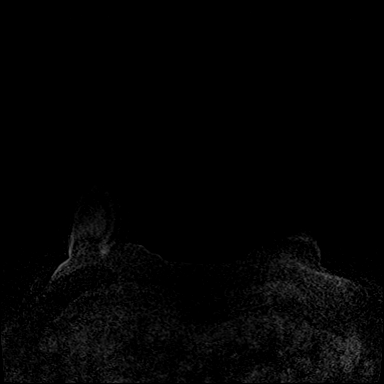

[Series 9: needle confirmation_sub · axial · 1.3mm · 0.73mm/px · z∈[-106,+80]mm · 4 of 144 slices shown]
[im 1/144]
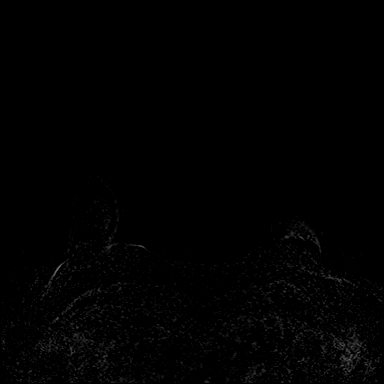
[im 48/144]
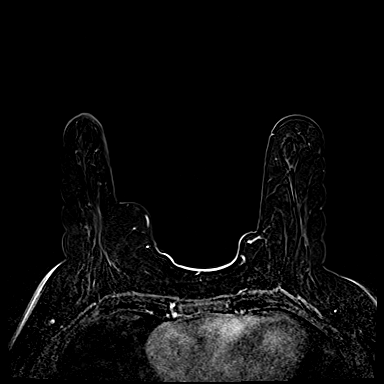
[im 96/144]
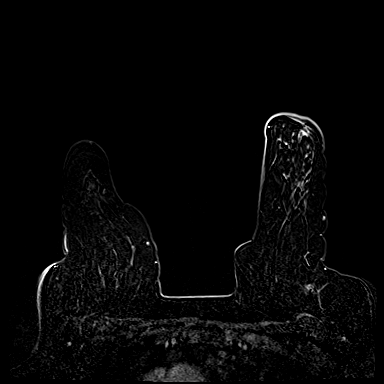
[im 144/144]
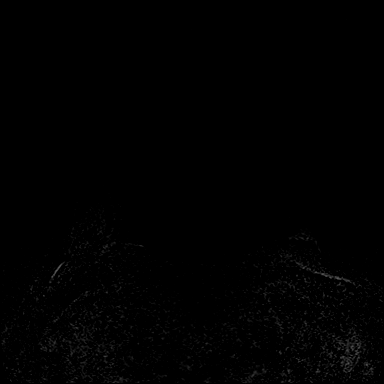

[32 of 48 positions shown; findings below may reference images not displayed]

FINDINGS: I met with the patient, and we discussed the procedure of MRI guided
biopsy, including risks, benefits, and alternatives. Specifically,
we discussed the risks of infection, bleeding, tissue injury, clip
migration, and inadequate sampling. Informed, written consent was
given. The usual time out protocol was performed immediately prior
to the procedure.

SITE 1 LEFT BREAST UPPER OUTER 10 MM ENHANCING MASS: Using sterile
technique, 1% Lidocaine, MRI guidance, and a 9 gauge vacuum assisted
device, biopsy was performed of the enhancing mass in the
upper-outer left breast using a lateral to medial approach. At the
conclusion of the procedure, a dumbbell shaped tissue marker clip
was deployed into the biopsy cavity. Follow-up 2-view mammogram was
performed and dictated separately.

SITE 2 RIGHT BREAST RETROAREOLAR 12 MM LINEAR ENHANCEMENT: Using
sterile technique, 1% Lidocaine, MRI guidance, and a 9 gauge vacuum
assisted device, biopsy was performed of the linear enhancement in
the retroareolar right breast using a lateral to medial approach. At
the conclusion of the procedure, a dumbbell shaped tissue marker
clip was deployed into the biopsy cavity. Follow-up 2-view mammogram
was performed and dictated separately.
IMPRESSION: 1. MRI guided biopsy of the enhancing mass in the upper-outer left
breast.

2. MRI guided biopsy of the linear enhancement in the retroareolar
right breast.

ADDENDUM:
Pathology revealed BENIGN BREAST TISSUE WITH INCREASED STROMAL
FIBROSIS AND PATCHY PERIDUCTULAR CHRONIC INFLAMMATION of the LEFT
breast, upper outer quadrant. This was found to be concordant by Dr.
Thoren Ps.

Pathology revealed BENIGN BREAST TISSUE WITH INCREASED STROMAL
FIBROSIS of the RIGHT breast, retroareolar. This was found to be
concordant by Dr. Thoren Ps.

Pathology results were discussed with the patient by telephone. The
patient reported doing well after the biopsies with tenderness at
the sites. Post biopsy instructions and care were reviewed and
questions were answered. The patient was encouraged to call The

The patient has a recent diagnosis of LEFT breast cancer and should
follow her outlined treatment plan.

Pathology results reported by Matiah Poddar, RN on 08/16/2020.

*** End of Addendum ***
FINDINGS: I met with the patient, and we discussed the procedure of MRI guided
biopsy, including risks, benefits, and alternatives. Specifically,
we discussed the risks of infection, bleeding, tissue injury, clip
migration, and inadequate sampling. Informed, written consent was
given. The usual time out protocol was performed immediately prior
to the procedure.

SITE 1 LEFT BREAST UPPER OUTER 10 MM ENHANCING MASS: Using sterile
technique, 1% Lidocaine, MRI guidance, and a 9 gauge vacuum assisted
device, biopsy was performed of the enhancing mass in the
upper-outer left breast using a lateral to medial approach. At the
conclusion of the procedure, a dumbbell shaped tissue marker clip
was deployed into the biopsy cavity. Follow-up 2-view mammogram was
performed and dictated separately.

SITE 2 RIGHT BREAST RETROAREOLAR 12 MM LINEAR ENHANCEMENT: Using
sterile technique, 1% Lidocaine, MRI guidance, and a 9 gauge vacuum
assisted device, biopsy was performed of the linear enhancement in
the retroareolar right breast using a lateral to medial approach. At
the conclusion of the procedure, a dumbbell shaped tissue marker
clip was deployed into the biopsy cavity. Follow-up 2-view mammogram
was performed and dictated separately.
IMPRESSION: 1. MRI guided biopsy of the enhancing mass in the upper-outer left
breast.

2. MRI guided biopsy of the linear enhancement in the retroareolar
right breast.

## 2022-01-03 DIAGNOSIS — H16233 Neurotrophic keratoconjunctivitis, bilateral: Secondary | ICD-10-CM | POA: Diagnosis not present

## 2022-01-25 ENCOUNTER — Encounter: Payer: Self-pay | Admitting: Oncology

## 2022-01-25 DIAGNOSIS — H16233 Neurotrophic keratoconjunctivitis, bilateral: Secondary | ICD-10-CM | POA: Diagnosis not present

## 2022-01-25 NOTE — Telephone Encounter (Signed)
See np this week sarah or lauren followed by possible imaging

## 2022-01-30 ENCOUNTER — Inpatient Hospital Stay: Payer: BC Managed Care – PPO | Attending: Oncology | Admitting: Nurse Practitioner

## 2022-01-30 ENCOUNTER — Encounter: Payer: Self-pay | Admitting: Nurse Practitioner

## 2022-01-30 VITALS — BP 118/73 | HR 78 | Temp 98.3°F | Wt 190.0 lb

## 2022-01-30 DIAGNOSIS — N393 Stress incontinence (female) (male): Secondary | ICD-10-CM

## 2022-01-30 DIAGNOSIS — N644 Mastodynia: Secondary | ICD-10-CM | POA: Diagnosis not present

## 2022-01-30 DIAGNOSIS — R0789 Other chest pain: Secondary | ICD-10-CM | POA: Insufficient documentation

## 2022-01-30 DIAGNOSIS — R32 Unspecified urinary incontinence: Secondary | ICD-10-CM | POA: Insufficient documentation

## 2022-01-30 DIAGNOSIS — Z170 Estrogen receptor positive status [ER+]: Secondary | ICD-10-CM | POA: Insufficient documentation

## 2022-01-30 DIAGNOSIS — Z87891 Personal history of nicotine dependence: Secondary | ICD-10-CM | POA: Insufficient documentation

## 2022-01-30 DIAGNOSIS — C50412 Malignant neoplasm of upper-outer quadrant of left female breast: Secondary | ICD-10-CM | POA: Diagnosis not present

## 2022-01-30 NOTE — Progress Notes (Signed)
Sympt[facility] Management [place] [facility] [place]. [facility] [address] [place], [place] [address] [phone] (phone) [phone] (fax)  Patient Care Team: Kirk Ruths, MD as PCP - General (Internal Medicine) Rico Junker, RN as Oncology Nurse Navigator Noreene Filbert, MD as Consulting Physician (Radiation Oncology) Sindy Guadeloupe, MD as Consulting Physician (Oncology) Herbert Pun, MD as Consulting Physician (General Surgery)   Name of the patient: Wendy Caldwell  [id number]  05-09-1955   Date of visit: 01/30/22  Diagnosis- Hx of breast cancer  Chief complaint/ Reason for visit- left chest wall/breast pain  Heme/Onc history:  Oncology History  Malignant neoplasm of upper-outer quadrant of left breast in female, estrogen receptor positive ([place])  07/12/2020 Initial Diagnosis   Malignant neoplasm of upper-outer quadrant of left breast in female, estrogen receptor positive ([place])   07/12/2020 Cancer Staging   Staging form: Breast, AJCC 8th Edition - Clinical stage from 07/12/2020: Stage IIA (cT2, cN0, cM0, G3, ER+, PR-, HER2+) - Signed by Sindy Guadeloupe, MD on 07/12/2020 Stage prefix: Initial diagnosis Histologic grading system: 3 grade system   07/23/2020 - 09/30/2020 Chemotherapy         08/05/2020 Genetic Testing   Negative genetic testing. No pathogenic variants identified on the [facility] CancerNext-Expanded+RNA panel. VUS in APC called c.2218G>C, c.3628C>T, c.6779G>A and VUS in CHEK2 called c.1003G>C identified. The report date is 08/05/2020.  The CancerNext-Expanded + RNAinsight gene panel offered by [employer] and includes sequencing and rearrangement analysis for the following 77 genes: IP, ALK, APC*, ATM*, AXIN2, BAP1, BARD1, BLM, BMPR1A, BRCA1*, BRCA2*, BRIP1*, CDC73, CDH1*,CDK4, CDKN1B, CDKN2A, CHEK2*, CTNNA1, DICER1, FANCC, FH, FLCN, GALNT12, KIF1B, LZTR1, MAX,  MEN1, MET, MLH1*, MSH2*, MSH3, MSH6*, MUTYH*, NBN, NF1*, NF2, NTHL1, PALB2*, PHOX2B, PMS2*, POT1, PRKAR1A, PTCH1, PTEN*, RAD51C*, RAD51D*,RB1, RECQL, RET, SDHA, SDHAF2, SDHB, SDHC, SDHD, SMAD4, SMARCA4, SMARCB1, SMARCE1, STK11, SUFU, TMEM127, TP53*,TSC1, TSC2, VHL and XRCC2 (sequencing and deletion/duplication); EGFR, EGLN1, HOXB13, KIT, MITF, PDGFRA, POLD1 and POLE (sequencing only); EPCAM and GREM1 (deletion/duplication only).   11/12/2020 - 08/15/2021 Chemotherapy   Patient is on Treatment Plan : BREAST Trastuzumab  + Pertuzumab q21d x 13 cycles       Interval history- Patient is 66 year old female with above history of left breast cancer who presents to clinic for complaints of left breast/chest wall pain. Localizes to approximately 3:00, generalized discomfort. Worse at night. She has had a cold with frequent coughing and congestion, now improving. Pain occurred for week but has now resolved and no pain in past 2-3 days. She also reports urinary incontinence with coughing/sneezing. Would like to seen someone for this.   ECOG FS:1 - Symptomatic but completely ambulatory  Review of systems- Review of Systems  Constitutional:  Negative for chills, fever, malaise/fatigue and weight loss.  HENT:  Negative for hearing loss, nosebleeds, sore throat and tinnitus.   Eyes:  Negative for blurred vision and double vision.  Respiratory:  Negative for cough, hemoptysis, shortness of breath and wheezing.   Cardiovascular:  Negative for chest pain, palpitations and leg swelling.  Gastrointestinal:  Negative for abdominal pain, blood in stool, constipation, diarrhea, melena, nausea and vomiting.  Genitourinary:  Negative for dysuria and urgency.  Musculoskeletal:  Negative for back pain, falls, joint pain and myalgias.  Skin:  Negative for itching and rash.  Neurological:  Negative for dizziness, tingling, sensory change, loss of consciousness, weakness and headaches.  Endo/Heme/Allergies:  Negative  for  environmental allergies. Does not bruise/bleed easily.  Psychiatric/Behavioral:  Negative for depression. The patient is not nervous/anxious and does not have insomnia.     Current treatment- observation  No Known Allergies  Past Medical History:  Diagnosis Date   Family history of adverse reaction to anesthesia    adopted   GERD (gastroesophageal reflux disease)     Past Surgical History:  Procedure Laterality Date   BREAST BIOPSY Left 07/05/2020   [place] Bx, Q clip, [facility]   BREAST BIOPSY Left 07/05/2020   [place] Bx, [facility], benign breast tissue with increased stromal fibrosis   BREAST LUMPECTOMY     CARPAL TUNNEL RELEASE Bilateral    CHOLECYSTECTOMY     GANGLION CYST EXCISION     wrist   PART MASTECTOMY,RADIO FREQUENCY LOCALIZER,AXILLARY SENTINEL NODE BIOPSY Left 10/26/2020   Procedure: PART MASTECTOMY,RADIO FREQUENCY LOCALIZER,AXILLARY SENTINEL NODE BIOPSY;  Surgeon: Herbert Pun, MD;  Location: [facility] ORS;  Service: General;  Laterality: Left;   PORTACATH PLACEMENT Right 07/21/2020   Procedure: INSERTION PORT-A-CATH;  Surgeon: Herbert Pun, MD;  Location: [facility] ORS;  Service: General;  Laterality: Right;   right ovary removal      Social History   Socioeconomic History   Marital status: Married    Spouse name: Not on file   Number of children: Not on file   Years of education: Not on file   Highest education level: Not on file  Occupational History   Not on file  Tobacco Use   Smoking status: Former    Types: Cigarettes    Start date: 2012   Smokeless tobacco: Never  Vaping Use   Vaping Use: Never used  Substance and Sexual Activity   Alcohol use: Yes    Comment: occassional    Drug use: Never   Sexual activity: Not on file  Other Topics Concern   Not on file  Social History Narrative   Not on file   Social Determinants of Health   Financial Resource Strain: Not on file  Food Insecurity: Not on file  Transportation Needs: Not on file  Physical  Activity: Not on file  Stress: Not on file  Social Connections: Not on file  Intimate Partner Violence: Not on file    Family History  Adopted: Yes  Problem Relation Age of Onset   Breast cancer Neg Hx      Current Outpatient Medications:    cyclobenzaprine (FLEXERIL) 5 MG tablet, Take 1-2 tablets (5-10 mg total) by mouth 3 (three) times daily as needed for muscle spasms., Disp: 30 tablet, Rfl: 0   desonide (DESOWEN) 0.05 % cream, Apply topically 2 (two) times daily., Disp: 30 g, Rfl: 0   Multiple Vitamins-Minerals (EYE-VITE PLUS LUTEIN PO), Take 1 capsule by mouth 2 (two) times daily. AREDS2, Disp: , Rfl:    Multiple Vitamins-Minerals (MULTIVITAMIN WITH MINERALS) tablet, Take 1 tablet by mouth daily., Disp: , Rfl:    omeprazole (PRILOSEC) 40 MG capsule, Take 1 capsule (40 mg total) by mouth daily., Disp: 30 capsule, Rfl: 0   vitamin B-12 (CYANOCOBALAMIN) 1000 MCG tablet, Take 1,000 mcg by mouth daily., Disp: , Rfl:    vitamin C (ASCORBIC ACID) 500 MG tablet, Take 500 mg by mouth daily., Disp: , Rfl:    vitamin E 180 MG (400 UNITS) capsule, Take 400 Units by mouth daily., Disp: , Rfl:    azelastine (ASTELIN) 0.1 % nasal spray, Place 2 sprays into both nostrils 2 (two) times daily. Use in each nostril as  directed (Patient not taking: Reported on 07/04/2021), Disp: 30 mL, Rfl: 2   benzonatate (TESSALON) 100 MG capsule, Take 100 mg by mouth daily as needed for cough. (Patient not taking: Reported on 07/04/2021), Disp: , Rfl:    Lifitegrast (XIIDRA) 5 % SOLN, Apply to eye 2 (two) times daily., Disp: , Rfl:    loratadine-pseudoephedrine (CLARITIN-D 24 HOUR) 10-240 MG 24 hr tablet, Take 1 tablet by mouth daily. (Patient not taking: Reported on 10/29/2020), Disp: 30 tablet, Rfl: 0   ondansetron (ZOFRAN-ODT) 4 MG disintegrating tablet, Take 4 mg by mouth daily as needed for nausea/vomiting. (Patient not taking: Reported on 10/29/2020), Disp: , Rfl:    Polyethylene Glycol 400 (BLINK TEARS) 0.25 % SOLN,  Apply 1 drop to eye 5 (five) times daily as needed. Every 2 hrs daily, Disp: , Rfl:    triamcinolone ointment (KENALOG) 0.5 %, Apply 1 application topically 2 (two) times daily. (Patient not taking: Reported on 10/29/2020), Disp: 30 g, Rfl: 0 No current facility-administered medications for this visit.  Facility-Administered Medications Ordered in Other Visits:    acetaminophen (TYLENOL) 325 MG tablet, , , ,    diphenhydrAMINE (BENADRYL) 25 mg capsule, , , ,   Physical exam:  Vitals:   01/30/22 1014  BP: 118/73  Pulse: 78  Temp: 98.3 F (36.8 C)  TempSrc: Tympanic  Weight: 190 lb (86.2 kg)   Physical Exam Constitutional:      Appearance: She is not ill-appearing.  Cardiovascular:     Rate and Rhythm: Normal rate and regular rhythm.  Pulmonary:     Effort: No respiratory distress.  Chest:     Chest wall: No mass, swelling or tenderness.  Breasts:    Left: No swelling, mass, skin change or tenderness.  Abdominal:     General: There is no distension.  Lymphadenopathy:     Cervical: No cervical adenopathy.     Upper Body:     Left upper body: No supraclavicular, axillary or pectoral adenopathy.  Neurological:     Mental Status: She is alert.      No results found.  Assessment and plan- Patient is a 66 y.o. female diagnosed with left breast cancer s/p 4 cycles of neoadjuvant TCHP chemotherapy followed by surgery and adjuvant herceptin and perjeta. Declined endocrine therapy based on weak ER positivity and PR negativity. April 2023 mammogram was negative.   She has new left breast tenderness which has resolved. I question if symptoms are inflammatory related to recent/frequent coughing d/t URI. She will monitor her symptoms and if recurrent, consider chest imaging vs diagnostic mammogram. No palpable abnormalities on exam which is reassuring.   Urinary incontinence- referral to urogyn  Follow up as scheduled or if symptoms persist or worsen   Visit Diagnosis No diagnosis  found.  Patient expressed understanding and was in agreement with this plan. She also understands that She can call clinic at any time with any questions, concerns, or complaints.   Thank you for allowing me to participate in the care of this very pleasant patient.   Beckey Rutter, DNP, AGNP-C [place] [facility] [place]  CC:

## 2022-02-08 DIAGNOSIS — H18892 Other specified disorders of cornea, left eye: Secondary | ICD-10-CM | POA: Diagnosis not present

## 2022-02-08 DIAGNOSIS — H16232 Neurotrophic keratoconjunctivitis, left eye: Secondary | ICD-10-CM | POA: Diagnosis not present

## 2022-02-13 DIAGNOSIS — H16233 Neurotrophic keratoconjunctivitis, bilateral: Secondary | ICD-10-CM | POA: Diagnosis not present

## 2022-02-13 DIAGNOSIS — H18891 Other specified disorders of cornea, right eye: Secondary | ICD-10-CM | POA: Diagnosis not present

## 2022-02-17 DIAGNOSIS — H16233 Neurotrophic keratoconjunctivitis, bilateral: Secondary | ICD-10-CM | POA: Diagnosis not present

## 2022-03-16 ENCOUNTER — Ambulatory Visit
Admission: RE | Admit: 2022-03-16 | Discharge: 2022-03-16 | Disposition: A | Discharge status: Home / Self Care | Payer: BC Managed Care – PPO | Source: Ambulatory Visit | Attending: Oncology | Admitting: Oncology

## 2022-03-16 DIAGNOSIS — K219 Gastro-esophageal reflux disease without esophagitis: Secondary | ICD-10-CM | POA: Diagnosis not present

## 2022-03-16 DIAGNOSIS — Z08 Encounter for follow-up examination after completed treatment for malignant neoplasm: Secondary | ICD-10-CM | POA: Diagnosis not present

## 2022-03-16 DIAGNOSIS — Z0189 Encounter for other specified special examinations: Secondary | ICD-10-CM

## 2022-03-16 DIAGNOSIS — Z853 Personal history of malignant neoplasm of breast: Secondary | ICD-10-CM | POA: Insufficient documentation

## 2022-03-16 LAB — ECHOCARDIOGRAM COMPLETE
AR max vel: 2.47 cm2
AV Area VTI: 2.18 cm2
AV Area mean vel: 2.15 cm2
AV Mean grad: 4 mmHg
AV Peak grad: 6.9 mmHg
Ao pk vel: 1.31 m/s
Area-P 1/2: 4.39 cm2
S' Lateral: 2.7 cm

## 2022-03-16 NOTE — Progress Notes (Signed)
*  PRELIMINARY RESULTS* Echocardiogram 2D Echocardiogram has been performed.  Wendy Caldwell 03/16/2022, 9:55 AM

## 2022-03-21 ENCOUNTER — Encounter: Payer: Self-pay | Admitting: Oncology

## 2022-03-21 ENCOUNTER — Inpatient Hospital Stay: Payer: BC Managed Care – PPO | Attending: Oncology | Admitting: Oncology

## 2022-03-21 VITALS — BP 124/65 | HR 73 | Temp 97.0°F | Resp 16 | Ht 64.0 in | Wt 193.0 lb

## 2022-03-21 DIAGNOSIS — Z08 Encounter for follow-up examination after completed treatment for malignant neoplasm: Secondary | ICD-10-CM

## 2022-03-21 DIAGNOSIS — C50412 Malignant neoplasm of upper-outer quadrant of left female breast: Secondary | ICD-10-CM | POA: Diagnosis not present

## 2022-03-21 DIAGNOSIS — Z87891 Personal history of nicotine dependence: Secondary | ICD-10-CM | POA: Diagnosis not present

## 2022-03-21 DIAGNOSIS — Z853 Personal history of malignant neoplasm of breast: Secondary | ICD-10-CM

## 2022-03-21 DIAGNOSIS — Z170 Estrogen receptor positive status [ER+]: Secondary | ICD-10-CM | POA: Diagnosis not present

## 2022-03-21 NOTE — Progress Notes (Signed)
Hematology/Oncology Consult note [facility]  Telephone:([phone] [phone]  Patient Care Team: Kirk Ruths, MD as PCP - General (Internal Medicine) Rico Junker, RN as Oncology Nurse Navigator Noreene Filbert, MD as Consulting Physician (Radiation Oncology) Sindy Guadeloupe, MD as Consulting Physician (Oncology) Herbert Pun, MD as Consulting Physician (General Surgery)   Name of the patient: Wendy Caldwell  [id number]  Dec 19, 1955   Date of visit: 03/21/22  Diagnosis-  left breast cancer clinical prognostic stage IIA cT2 N0 M0 ER weakly positive, 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  She self palpated a left breast mass which prompted a bilateral diagnostic mammogram on 06/25/2020.  That showed an irregular hypoechoic mass 2.5 x 1.7 x 1.8 cm at 1 o'clock position 1 cm from the nipple.  3 cm from the nipple were benign cysts.  1 mildly enlarged left axillary lymph node with cortical thickening 4 mm.  Both the breast mass and the lymph node were biopsied.  Left breast biopsy was consistent with invasive mammary carcinoma grade 3 ER weakly +1 to 10%, PR negative and HER2 positive by IHC.   Patient completed 4 cycles of neoadjuvant TCHP chemotherapy which was complicated by worsening skin rash and fatigue.  Patient did not wish to proceed with 2 further cycles of chemotherapy and proceeded with lumpectomy and sentinel lymph node biopsy.  Final pathology showed complete pathological response on 10/26/2020.  2 sentinel lymph nodes negative for malignancy.   Patient is completed adjuvant radiation treatment and is currently on adjuvant Herceptin and Perjeta.  She does not wish to take endocrine therapy for weakly ER positive tumor  Interval  history-patient has been seeing ophthalmology for her dry eyes and has gone through multiple treatments including stem cells which has helped her but problems are not completely resolved.  She continues to get on and off shooting pains in her left breast where she had a lumpectomy.  ECOG PS- 1 Pain scale- 0   Review of systems- Review of Systems  Constitutional:  Negative for chills, fever, malaise/fatigue and weight loss.  HENT:  Negative for congestion, ear discharge and nosebleeds.        Dry eyes  Eyes:  Negative for blurred vision.  Respiratory:  Negative for cough, hemoptysis, sputum production, shortness of breath and wheezing.   Cardiovascular:  Negative for chest pain, palpitations, orthopnea and claudication.  Gastrointestinal:  Negative for abdominal pain, blood in stool, constipation, diarrhea, heartburn, melena, nausea and vomiting.  Genitourinary:  Negative for dysuria, flank pain, frequency, hematuria and urgency.  Musculoskeletal:  Negative for back pain, joint pain and myalgias.  Skin:  Negative for rash.  Neurological:  Negative for dizziness, tingling, focal weakness, seizures, weakness and headaches.  Endo/Heme/Allergies:  Does not bruise/bleed easily.  Psychiatric/Behavioral:  Negative for depression and suicidal ideas. The patient does not have insomnia.       No Known Allergies   Past Medical History:  Diagnosis Date   Family history of adverse reaction to anesthesia    adopted   GERD (gastroesophageal reflux disease)      Past Surgical History:  Procedure Laterality Date   BREAST BIOPSY Left 07/05/2020   [place] Bx, Q clip, [facility]   BREAST BIOPSY Left 07/05/2020   [place] Bx, Vision, benign breast tissue  with increased stromal fibrosis   BREAST LUMPECTOMY     CARPAL TUNNEL RELEASE Bilateral    CHOLECYSTECTOMY     GANGLION CYST EXCISION     wrist   PART MASTECTOMY,RADIO FREQUENCY LOCALIZER,AXILLARY SENTINEL NODE BIOPSY Left 10/26/2020   Procedure: PART  MASTECTOMY,RADIO FREQUENCY LOCALIZER,AXILLARY SENTINEL NODE BIOPSY;  Surgeon: Herbert Pun, MD;  Location: [facility] ORS;  Service: General;  Laterality: Left;   PORTACATH PLACEMENT Right 07/21/2020   Procedure: INSERTION PORT-A-CATH;  Surgeon: Herbert Pun, MD;  Location: [facility] ORS;  Service: General;  Laterality: Right;   right ovary removal      Social History   Socioeconomic History   Marital status: Married    Spouse name: Not on file   Number of children: Not on file   Years of education: Not on file   Highest education level: Not on file  Occupational History   Not on file  Tobacco Use   Smoking status: Former    Types: Cigarettes    Start date: 2012   Smokeless tobacco: Never  Vaping Use   Vaping Use: Never used  Substance and Sexual Activity   Alcohol use: Yes    Comment: occassional    Drug use: Never   Sexual activity: Not on file  Other Topics Concern   Not on file  Social History Narrative   Not on file   Social Determinants of Health   Financial Resource Strain: Not on file  Food Insecurity: Not on file  Transportation Needs: Not on file  Physical Activity: Not on file  Stress: Not on file  Social Connections: Not on file  Intimate Partner Violence: Not on file    Family History  Adopted: Yes  Problem Relation Age of Onset   Breast cancer Neg Hx      Current Outpatient Medications:    Multiple Vitamins-Minerals (EYE-VITE PLUS LUTEIN PO), Take 1 capsule by mouth 2 (two) times daily. AREDS2, Disp: , Rfl:    Multiple Vitamins-Minerals (MULTIVITAMIN WITH MINERALS) tablet, Take 1 tablet by mouth daily., Disp: , Rfl:    omeprazole (PRILOSEC) 40 MG capsule, Take 1 capsule (40 mg total) by mouth daily., Disp: 30 capsule, Rfl: 0   Perfluorohexyloctane (MIEBO OP), Apply to eye., Disp: , Rfl:    vitamin B-12 (CYANOCOBALAMIN) 1000 MCG tablet, Take 1,000 mcg by mouth daily., Disp: , Rfl:    vitamin C (ASCORBIC ACID) 500 MG tablet, Take 500 mg by  mouth daily., Disp: , Rfl:    vitamin E 180 MG (400 UNITS) capsule, Take 400 Units by mouth daily., Disp: , Rfl:    azelastine (ASTELIN) 0.1 % nasal spray, Place 2 sprays into both nostrils 2 (two) times daily. Use in each nostril as directed (Patient not taking: Reported on 07/04/2021), Disp: 30 mL, Rfl: 2   benzonatate (TESSALON) 100 MG capsule, Take 100 mg by mouth daily as needed for cough. (Patient not taking: Reported on 07/04/2021), Disp: , Rfl:    cyclobenzaprine (FLEXERIL) 5 MG tablet, Take 1-2 tablets (5-10 mg total) by mouth 3 (three) times daily as needed for muscle spasms. (Patient not taking: Reported on 03/21/2022), Disp: 30 tablet, Rfl: 0   desonide (DESOWEN) 0.05 % cream, Apply topically 2 (two) times daily. (Patient not taking: Reported on 03/21/2022), Disp: 30 g, Rfl: 0   ondansetron (ZOFRAN-ODT) 4 MG disintegrating tablet, Take 4 mg by mouth daily as needed for nausea/vomiting. (Patient not taking: Reported on 10/29/2020), Disp: , Rfl:    triamcinolone ointment (KENALOG) 0.5 %,  Apply 1 application topically 2 (two) times daily. (Patient not taking: Reported on 10/29/2020), Disp: 30 g, Rfl: 0 No current facility-administered medications for this visit.  Facility-Administered Medications Ordered in Other Visits:    acetaminophen (TYLENOL) 325 MG tablet, , , ,    diphenhydrAMINE (BENADRYL) 25 mg capsule, , , ,   Physical exam:  Vitals:   03/21/22 1101  BP: 124/65  Pulse: 73  Resp: 16  Temp: (!) 97 F (36.1 C)  TempSrc: Tympanic  SpO2: 99%  Weight: 193 lb (87.5 kg)  Height: '5\' 4"'$  (1.626 m)   Physical Exam Cardiovascular:     Rate and Rhythm: Normal rate and regular rhythm.     Heart sounds: Normal heart sounds.  Pulmonary:     Effort: Pulmonary effort is normal.     Breath sounds: Normal breath sounds.  Skin:    General: Skin is warm and dry.  Neurological:     Mental Status: She is alert and oriented to person, place, and time.    Breast exam was performed in seated  and lying down position. Patient is status post left lumpectomy with a well-healed surgical scar. No evidence of any palpable masses. No evidence of axillary adenopathy. No evidence of any palpable masses or lumps in the right breast. No evidence of right axillary adenopathy      Latest Ref Rng & Units 11/15/2021   10:04 AM  CMP  Glucose 70 - 99 mg/dL 106   BUN 8 - 23 mg/dL 20   Creatinine 0.44 - 1.00 mg/dL 0.87   Sodium 135 - 145 mmol/L 139   Potassium 3.5 - 5.1 mmol/L 4.0   Chloride 98 - 111 mmol/L 105   CO2 22 - 32 mmol/L 25   Calcium 8.9 - 10.3 mg/dL 9.1   Total Protein 6.5 - 8.1 g/dL 7.4   Total Bilirubin 0.3 - 1.2 mg/dL 0.7   Alkaline Phos 38 - 126 U/L 74   AST 15 - 41 U/L 21   ALT 0 - 44 U/L 20       Latest Ref Rng & Units 11/15/2021   10:04 AM  CBC  WBC 4.0 - 10.5 K/uL 7.6   Hemoglobin 12.0 - 15.0 g/dL 12.4   Hematocrit 36.0 - 46.0 % 39.5   Platelets 150 - 400 K/uL 293     No images are attached to the encounter.  ECHOCARDIOGRAM COMPLETE  Result Date: 03/16/2022    ECHOCARDIOGRAM REPORT   Patient Name:   Wendy Caldwell [facility] Date of Exam: 03/16/2022 Medical Rec #:  [id number]          Height:       64.0 in Accession #:    [id number]         Weight:       190.0 lb Date of Birth:  August 29, 1955         BSA:          1.914 m Patient Age:    44 years           BP:           118/73 mmHg Patient Gender: F                  HR:           78 bpm. Exam Location:  [facility] Procedure: 2D Echo, Cardiac Doppler, Color Doppler and Strain Analysis Indications:     Encounter for follow-up surveillance of breast cancer Z08,  Z85.3  History:         Patient has prior history of Echocardiogram examinations, most                  recent 06/23/2021. GERD.  Sonographer:     Sherrie Sport Referring Phys:  [id number] Weston Anna Yuniel Blaney Diagnosing Phys: Ida Rogue MD  Sonographer Comments: Global longitudinal strain was attempted. IMPRESSIONS  1. Left ventricular ejection fraction, by estimation, is 60 to  65%. The left ventricle has normal function. The left ventricle has no regional wall motion abnormalities. Left ventricular diastolic parameters were normal. The average left ventricular global longitudinal strain is -19.4 %. The global longitudinal strain is normal.  2. Right ventricular systolic function is normal. The right ventricular size is normal. There is normal pulmonary artery systolic pressure. The estimated right ventricular systolic pressure is 11.9 mmHg.  3. The mitral valve is normal in structure. Mild mitral valve regurgitation. No evidence of mitral stenosis.  4. The aortic valve is normal in structure. Aortic valve regurgitation is not visualized. No aortic stenosis is present.  5. The inferior vena cava is normal in size with greater than 50% respiratory variability, suggesting right atrial pressure of 3 mmHg. FINDINGS  Left Ventricle: Left ventricular ejection fraction, by estimation, is 60 to 65%. The left ventricle has normal function. The left ventricle has no regional wall motion abnormalities. The average left ventricular global longitudinal strain is -19.4 %. The global longitudinal strain is normal. The left ventricular internal cavity size was normal in size. There is no left ventricular hypertrophy. Left ventricular diastolic parameters were normal. Right Ventricle: The right ventricular size is normal. No increase in right ventricular wall thickness. Right ventricular systolic function is normal. There is normal pulmonary artery systolic pressure. The tricuspid regurgitant velocity is 2.87 m/s, and  with an assumed right atrial pressure of 3 mmHg, the estimated right ventricular systolic pressure is 41.7 mmHg. Left Atrium: Left atrial size was normal in size. Right Atrium: Right atrial size was normal in size. Pericardium: There is no evidence of pericardial effusion. Mitral Valve: The mitral valve is normal in structure. Mild mitral valve regurgitation. No evidence of mitral valve  stenosis. Tricuspid Valve: The tricuspid valve is normal in structure. Tricuspid valve regurgitation is mild . No evidence of tricuspid stenosis. Aortic Valve: The aortic valve is normal in structure. Aortic valve regurgitation is not visualized. No aortic stenosis is present. Aortic valve mean gradient measures 4.0 mmHg. Aortic valve peak gradient measures 6.9 mmHg. Aortic valve area, by VTI measures 2.18 cm. Pulmonic Valve: The pulmonic valve was normal in structure. Pulmonic valve regurgitation is not visualized. No evidence of pulmonic stenosis. Aorta: The aortic root is normal in size and structure. Venous: The inferior vena cava is normal in size with greater than 50% respiratory variability, suggesting right atrial pressure of 3 mmHg. IAS/Shunts: No atrial level shunt detected by color flow Doppler.  LEFT VENTRICLE PLAX 2D LVIDd:         4.30 cm   Diastology LVIDs:         2.70 cm   LV e' medial:    9.57 cm/s LV PW:         0.90 cm   LV E/e' medial:  8.9 LV IVS:        0.90 cm   LV e' lateral:   9.57 cm/s LVOT diam:     2.00 cm   LV E/e' lateral: 8.9 LV SV:  21 LV SV Index:   33        2D Longitudinal Strain LVOT Area:     3.14 cm  2D Strain GLS Avg:     -19.4 %                           3D Volume EF:                          3D EF:        53 %                          LV EDV:       103 ml                          LV ESV:       48 ml                          LV SV:        55 ml RIGHT VENTRICLE RV Basal diam:  3.40 cm RV Mid diam:    2.90 cm RV S prime:     17.20 cm/s TAPSE (M-mode): 3.2 cm LEFT ATRIUM             Index        RIGHT ATRIUM           Index LA diam:        3.50 cm 1.83 cm/m   RA Area:     14.40 cm LA Vol (A2C):   38.4 ml 20.06 ml/m  RA Volume:   36.60 ml  19.12 ml/m LA Vol (A4C):   30.3 ml 15.83 ml/m LA Biplane Vol: 37.7 ml 19.70 ml/m  AORTIC VALVE AV Area (Vmax):    2.47 cm AV Area (Vmean):   2.15 cm AV Area (VTI):     2.18 cm AV Vmax:           131.00 cm/s AV Vmean:           92.700 cm/s AV VTI:            0.293 m AV Peak Grad:      6.9 mmHg AV Mean Grad:      4.0 mmHg LVOT Vmax:         103.00 cm/s LVOT Vmean:        63.400 cm/s LVOT VTI:          0.203 m LVOT/AV VTI ratio: 0.69  AORTA Ao Root diam: 2.90 cm MITRAL VALVE               TRICUSPID VALVE MV Area (PHT): 4.39 cm    TR Peak grad:   32.9 mmHg MV Decel Time: 173 msec    TR Vmax:        287.00 cm/s MV E velocity: 85.40 cm/s MV A velocity: 67.10 cm/s  SHUNTS MV E/A ratio:  1.27        Systemic VTI:  0.20 m                            Systemic Diam: 2.00 cm Ida Rogue MD Electronically signed by Ida Rogue MD Signature Date/Time: 03/16/2022/3:55:20 PM    Final  Assessment and plan- Patient is a 67 y.o. female with clinical prognostic stage IIa T2 N0 M0 grade 3 invasive mammary carcinoma ER weakly positive, PR negative and HER2 positive.  She received 4 cycles of neoadjuvant TCHP chemotherapy with complete pathological response.  She is here for routine follow-up visit  Clinically patient is doing well with no signs and symptoms of recurrence based on today's exam.  Her mammogram from April 2023 was unremarkable.  I will see her back in 4 months with no labs.1  also reviewed the results of her recent echocardiogram which was done 6 months after completing Herceptin and Perjeta which shows a normal EF with no regional wall motion abnormalities.  Patient had a weakly ER +1 to 10% and PR negative disease and therefore she chose not to take endocrine therapy.   Visit Diagnosis 1. Encounter for follow-up surveillance of breast cancer      Dr. Randa Evens, MD, MPH [facility] [phone] 03/21/2022 12:42 PM

## 2022-03-28 DIAGNOSIS — H16233 Neurotrophic keratoconjunctivitis, bilateral: Secondary | ICD-10-CM | POA: Diagnosis not present

## 2022-03-28 DIAGNOSIS — H18893 Other specified disorders of cornea, bilateral: Secondary | ICD-10-CM | POA: Diagnosis not present

## 2022-03-28 DIAGNOSIS — H2513 Age-related nuclear cataract, bilateral: Secondary | ICD-10-CM | POA: Diagnosis not present

## 2022-04-05 ENCOUNTER — Encounter: Payer: Self-pay | Admitting: Obstetrics and Gynecology

## 2022-04-05 ENCOUNTER — Ambulatory Visit: Payer: BC Managed Care – PPO | Admitting: Obstetrics and Gynecology

## 2022-04-05 VITALS — BP 130/64 | HR 64 | Ht 61.0 in | Wt 192.0 lb

## 2022-04-05 DIAGNOSIS — R350 Frequency of micturition: Secondary | ICD-10-CM | POA: Diagnosis not present

## 2022-04-05 DIAGNOSIS — R351 Nocturia: Secondary | ICD-10-CM

## 2022-04-05 DIAGNOSIS — K5904 Chronic idiopathic constipation: Secondary | ICD-10-CM

## 2022-04-05 DIAGNOSIS — N393 Stress incontinence (female) (male): Secondary | ICD-10-CM | POA: Diagnosis not present

## 2022-04-05 LAB — POCT URINALYSIS DIPSTICK
Bilirubin, UA: NEGATIVE
Blood, UA: NEGATIVE
Glucose, UA: NEGATIVE
Ketones, UA: NEGATIVE
Leukocytes, UA: NEGATIVE
Nitrite, UA: NEGATIVE
Protein, UA: NEGATIVE
Spec Grav, UA: >=1.03 — AB (ref 1.010–1.025)
Urobilinogen, UA: 0.2 E.U./dL
pH, UA: 5.5 (ref 5.0–8.0)

## 2022-04-05 NOTE — Progress Notes (Signed)
[facility] New Patient Evaluation and Consultation  Referring Provider: Verlon Au, NP PCP: Kirk Ruths, MD Date of Service: 04/05/2022  SUBJECTIVE Chief Complaint: New Patient (Initial Visit) Wendy Caldwell is a 67 y.o. female here for a consult for Stress Incontinence for the past 5 years. Pt said her stress incontinence has been works since she completed chemo.Marland Kitchen)  History of Present Illness: Wendy Caldwell is a 67 y.o. White or Caucasian female seen in consultation at the request of NP Beckey Rutter for evaluation of incontinence.    Review of records significant for: Hx of breast cancer. Has incontinence with coughing and sneezing.   Urinary Symptoms: Leaks urine with cough/ sneeze and laughing Noticed more frequently during cancer treatment due to post nasal drip/ allergy attacks, which leads to coughing and sneezing.  Does not leak every day.  Pad use: none She is bothered by her UI symptoms.  Day time voids 4+ (does have some urgency but does not leak).  Nocturia: 2-4 times per night to void. Drinks tea before bed in the evening.  Voiding dysfunction: she does not empty her bladder well.  does not use a catheter to empty bladder.  When urinating, she feels dribbling after finishing, the need to urinate multiple times in a row, and to push on her belly or vagina to empty bladder  UTIs:  0  UTI's in the last year.   Denies history of blood in urine and kidney or bladder stones  Pelvic Organ Prolapse Symptoms:                  She Denies a feeling of a bulge the vaginal area.   Bowel Symptom: Bowel movements: every third day Stool consistency: hard Straining: yes.  Splinting: no.  Incomplete evacuation: no.  She Denies accidental bowel leakage / fecal incontinence Bowel regimen: none   Sexual Function Sexually active: yes.  Sexual orientation:  heterosexual Pain with sex: No  Pelvic Pain Denies pelvic pain   Past Medical  History:  Past Medical History:  Diagnosis Date   Breast Cancer    Family history of adverse reaction to anesthesia    adopted   GERD (gastroesophageal reflux disease)      Past Surgical History:   Past Surgical History:  Procedure Laterality Date   BREAST BIOPSY Left 07/05/2020   [place] Bx, Q clip, [facility]   BREAST BIOPSY Left 07/05/2020   [place] Bx, Vision, benign breast tissue with increased stromal fibrosis   BREAST LUMPECTOMY     CARPAL TUNNEL RELEASE Bilateral    CHOLECYSTECTOMY     GANGLION CYST EXCISION     wrist   PART MASTECTOMY,RADIO FREQUENCY LOCALIZER,AXILLARY SENTINEL NODE BIOPSY Left 10/26/2020   Procedure: PART MASTECTOMY,RADIO FREQUENCY LOCALIZER,AXILLARY SENTINEL NODE BIOPSY;  Surgeon: Herbert Pun, MD;  Location: [facility] ORS;  Service: General;  Laterality: Left;   PORTACATH PLACEMENT Right 07/21/2020   Procedure: INSERTION PORT-A-CATH;  Surgeon: Herbert Pun, MD;  Location: [facility] ORS;  Service: General;  Laterality: Right;   right ovary removal       Past OB/GYN History: OB History  Gravida Para Term Preterm AB Living  '4 2 2   2 2  '$ SAB IAB Ectopic Multiple Live Births          2    # Outcome Date GA Lbr Len/2nd Weight Sex Delivery Anes PTL Lv  4 Term      Vag-Spont     3 Term  Vag-Spont     2 AB           1 AB            Menopausal: Yes, Denies vaginal bleeding since menopause Last pap smear was 2012- does not want to get another pap smear.  Any history of abnormal pap smears: no.   Medications: She has a current medication list which includes the following prescription(s): multiple vitamins-minerals, multivitamin with minerals, omeprazole, cyanocobalamin, ascorbic acid, vitamin e, and [DISCONTINUED] prochlorperazine, and the following Facility-Administered Medications: acetaminophen and diphenhydramine.   Allergies: Patient has No Known Allergies.   Social History:  Social History   Tobacco Use   Smoking status: Former    Types:  Cigarettes    Start date: 2012   Smokeless tobacco: Never  Vaping Use   Vaping Use: Never used  Substance Use Topics   Alcohol use: Yes    Comment: occassional    Drug use: Never    Relationship status: married She lives with her spouse.   She is not employed. Regular exercise: No History of abuse: No  Family History:   Family History  Adopted: Yes  Problem Relation Age of Onset   Breast cancer Neg Hx      Review of Systems: Review of Systems  Constitutional:  Negative for fever, malaise/fatigue and weight loss.       + weight gain  Respiratory:  Negative for cough, shortness of breath and wheezing.   Cardiovascular:  Negative for chest pain, palpitations and leg swelling.  Gastrointestinal:  Negative for abdominal pain and blood in stool.  Genitourinary:  Negative for dysuria.  Musculoskeletal:  Negative for myalgias.  Skin:  Negative for rash.  Neurological:  Negative for dizziness and headaches.  Endo/Heme/Allergies:  Does not bruise/bleed easily.  Psychiatric/Behavioral:  Negative for depression. The patient is not nervous/anxious.      OBJECTIVE Physical Exam: Vitals:   04/05/22 0812  BP: 130/64  Pulse: 64  Weight: 192 lb (87.1 kg)  Height: '5\' 1"'$  (1.549 m)    Physical Exam Constitutional:      General: She is not in acute distress. Pulmonary:     Effort: Pulmonary effort is normal.  Abdominal:     General: There is no distension.     Palpations: Abdomen is soft.     Tenderness: There is no abdominal tenderness. There is no rebound.  Musculoskeletal:        General: No swelling. Normal range of motion.  Skin:    General: Skin is warm and dry.     Findings: No rash.  Neurological:     Mental Status: She is alert and oriented to person, place, and time.  Psychiatric:        Mood and Affect: Mood normal.        Behavior: Behavior normal.      GU / Detailed Urogynecologic Evaluation:  Pelvic Exam: Normal external female genitalia; Bartholin's  and Skene's glands normal in appearance; urethral meatus normal in appearance, no urethral masses or discharge.   CST: negative  Speculum exam reveals normal vaginal mucosa with atrophy. Cervix normal appearance. Uterus normal single, nontender. Adnexa no mass, fullness, tenderness.     Pelvic floor strength I/V  Pelvic floor musculature: Right levator non-tender, Right obturator non-tender, Left levator tender, Left obturator non-tender  POP-Q:   POP-Q  -2  Aa   -2                                           Ba  -7                                              C   2                                            Gh  2.5                                            Pb  8                                            tvl   -2                                            Ap  -2                                            Bp  -8                                              D      Rectal Exam:  Normal external rectum  Post-Void Residual (PVR) by Bladder Scan: In order to evaluate bladder emptying, we discussed obtaining a postvoid residual and she agreed to this procedure.  Procedure: The ultrasound unit was placed on the patient's abdomen in the suprapubic region after the patient had voided. A PVR of 24 ml was obtained by bladder scan.  Laboratory Results: POC urine: negative   ASSESSMENT AND PLAN Ms. Wildes is a 67 y.o. with:  1. SUI (stress urinary incontinence, female)   2. Urinary frequency   3. Nocturia   4. Chronic idiopathic constipation    SUI - For treatment of stress urinary incontinence,  non-surgical options include expectant management, weight loss, physical therapy, as well as a pessary.  Surgical options include a midurethral sling, Burch urethropexy, and transurethral injection of a bulking agent. - She is interested in pelvic PT, referral placed to [facility] rehab  2. Nocturia - recommended decreasing liquids 2-3 hours  prior to bedtime.   3. Constipation - For constipation, we reviewed the importance of a better bowel regimen as this can worsen pelvic floor symptoms.  We discussed initiating therapy with increasing fluid intake, fiber supplementation, stool softeners, and laxatives such as miralax.    Return as needed   Jaquita Folds,  MD  \

## 2022-04-05 NOTE — Patient Instructions (Addendum)
Constipation: Our goal is to achieve formed bowel movements daily or every-other-day.  You may need to try different combinations of the following options to find what works best for you - everybody's body works differently so feel free to adjust the dosages as needed.  Some options to help maintain bowel health include:  Dietary changes (more leafy greens, vegetables and fruits; less processed foods) Fiber supplementation (Benefiber, FiberCon, Metamucil or Psyllium). Start slow and increase gradually to full dose. Over-the-counter agents such as: stool softeners (Docusate or Colace) and/or laxatives (Miralax, milk of magnesia)  "Power Pudding" is a natural mixture that may help your constipation.  To make blend 1 cup applesauce, 1 cup wheat bran, and 3/4 cup prune juice, refrigerate and then take 1 tablespoon daily with a large glass of water as needed.  For treatment of stress urinary incontinence, which is leakage with physical activity/movement/strainging/coughing, we discussed expectant management versus nonsurgical options versus surgery. Nonsurgical options include weight loss, physical therapy, as well as a pessary.  Surgical options include a midurethral sling, which is a synthetic mesh sling that acts like a hammock under the urethra to prevent leakage of urine, and transurethral injection of a bulking agent.

## 2022-04-10 ENCOUNTER — Ambulatory Visit: Payer: BC Managed Care – PPO | Admitting: Family Medicine

## 2022-04-10 ENCOUNTER — Encounter: Payer: Self-pay | Admitting: Family Medicine

## 2022-04-10 DIAGNOSIS — K219 Gastro-esophageal reflux disease without esophagitis: Secondary | ICD-10-CM | POA: Diagnosis not present

## 2022-04-10 DIAGNOSIS — H04123 Dry eye syndrome of bilateral lacrimal glands: Secondary | ICD-10-CM

## 2022-04-10 DIAGNOSIS — M159 Polyosteoarthritis, unspecified: Secondary | ICD-10-CM

## 2022-04-10 DIAGNOSIS — Z7689 Persons encountering health services in other specified circumstances: Secondary | ICD-10-CM | POA: Diagnosis not present

## 2022-04-10 DIAGNOSIS — Z853 Personal history of malignant neoplasm of breast: Secondary | ICD-10-CM | POA: Diagnosis not present

## 2022-04-10 DIAGNOSIS — N393 Stress incontinence (female) (male): Secondary | ICD-10-CM

## 2022-04-10 NOTE — Patient Instructions (Addendum)
Thank you for coming to the office today.  ---------- Call insurance find cost and coverage of the following - check the following: - Drug Tier, Preferred List, On Formulary - All will require a "Prior Authorization" from [place] first, before you can find out the cost - Find out if there is "Step Therapy" (other medicines required before you can try these)  Once you pick the one you want to try, let me know - we can get a sample ready IF we have it in stock. Then try it - and before running out of medicine, contact me back to order your Rx so we have time to get it processed.  For Weight Loss / Obesity only  Wegovy (same as Ozempic) weekly injection - start 0.'25mg'$  weekly, 1 dose per pen, single use, auto-injector  Still not available, on back order.  2. Saxenda - DAILY injection - start 0.'6mg'$  injection DAILY, you can increase the dose by 1 notch or 0.6 mg per week, if you don't tolerate a dose, can reduce it the next day. XXXXXX  3. Zepbound (Tirzepatide - this is the new version of "Mounjaro = diabetes") - weekly injectable, least side  4. Contrave - oral medication, appetite suppression has wellbutrin/bupropion and naltrexone in it and it can also help with appetite, it is ordered through a speciality pharmacy. - $99 per month   Future make sure your insurance has weight loss coverage  WEIGHT MANAGEMENT  Dr Dennard Nip  [facility] [place], [place] [address] Ph: [phone]  ---------------------------------   Please schedule a Follow-up Appointment to: Return in about 3 months (around 07/10/2022) for 3 month follow-up Weight Management.  If you have any other questions or concerns, please feel free to call the office or send a message through [facility]. You may also schedule an earlier appointment if necessary.  Additionally, you may be receiving a survey about your experience at our office within a few days to 1 week by e-mail or  mail. We value your feedback.  Nobie Putnam, DO [place]

## 2022-04-10 NOTE — Progress Notes (Signed)
Subjective:    Patient ID: Wendy Caldwell, female    DOB: 10-11-1955, 67 y.o.   MRN: [id number]  Wendy Caldwell is a 67 y.o. female presenting on 04/10/2022 for Establish Care  Establish care with new PC  HPI  History of Left Breast Cancer S/p lumpectomy, chemotherapy, radiation  Background history  March 2022 married after 20 years previously single, then April 2022 diagnosed with breast cancer Summer 2022 with surgery and treatment, s/p lumpectomy + chemotherapy + radiation August 2023 diagnosed "Cancer Free" from breast cancer  Several complications following chemo / radiation  Upcoming Mammogram through Dr Windell Moment Upcoming surveillance w/ Dr Janese Banks in 4 months, May 2024  She has had severe dry - she had various glasses rx and severe symptoms, she said tear film would evaporate instantly. Dobson, stem cell disc put in the eye, and keep eye closed for period of time, has had both eyes treated now. She is now in mild to moderate category now, has another treatment upcoming. Vision has improved. Miebo eye drops with notable relief.  Abnormal Weight Gain Post chemotherapy Previous baseline 152-167 lbs is average weight. She has been sedentary with work, worked virtually remotely and just retired 11/2021. She admits inactivity component with work. But she is not sedentary on day to day. She is member to [employer] but has not resumed this yet. Has gained weight up to 190+ lbs within past 1 year. Difficulty slowing this down. Previous doctor has ordered GLP1 therapy. She tried [place] initial dose and had nausea vomiting after 2 injections and had vomiting for 3 days. Drinks a glass of wine few times a week Enjoys carbs / starches. She does eat a moderate diet.  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  Osteoarthritis, multiple joints Admits joint pain knee arthritis Left thumb CMC Right elbow She did rodeo 40+ years ago had prior joint injuries Not interested in therapy at this time  Psoriasis, plaque psoriasis She was on Humira for 18 years, she had stopped this prior to chemotherapy.  GERD On Omeprazole '40mg'$  daily before breakfast. Not due for re order yet.  She has retired from Teaching laboratory technician work recently.   Seasonal Environmental Allergies  Childhood Asthma / Allergies Mostly resolved  Health Maintenance: Update at next visit.      04/10/2022   12:51 PM  Depression screen PHQ 2/9  Decreased Interest 0  Down, Depressed, Hopeless 0  PHQ - 2 Score 0    Past Medical History:  Diagnosis Date   Breast Cancer    Family history of adverse reaction to anesthesia    adopted   GERD (gastroesophageal reflux disease)    Past Surgical History:  Procedure Laterality Date   BREAST BIOPSY Left 07/05/2020   [place] Bx, Q clip, [facility]   BREAST BIOPSY Left 07/05/2020   [place] Bx, [facility], benign breast tissue with increased stromal fibrosis   BREAST LUMPECTOMY     CARPAL TUNNEL RELEASE Bilateral    CHOLECYSTECTOMY     GANGLION CYST EXCISION     wrist   PART MASTECTOMY,RADIO FREQUENCY LOCALIZER,AXILLARY SENTINEL NODE BIOPSY Left 10/26/2020   Procedure: PART MASTECTOMY,RADIO FREQUENCY LOCALIZER,AXILLARY SENTINEL NODE BIOPSY;  Surgeon: Herbert Pun, MD;  Location: [facility] ORS;  Service: General;  Laterality: Left;   PORTACATH PLACEMENT Right 07/21/2020  Procedure: INSERTION PORT-A-CATH;  Surgeon: Herbert Pun, MD;  Location: [facility] ORS;  Service: General;  Laterality: Right;   right ovary removal     Social History   Socioeconomic History   Marital status: Married    Spouse name: Not on file   Number of children: Not on file   Years of education: Not on file   Highest education level: Not on file  Occupational History   Not on file   Tobacco Use   Smoking status: Former    Types: Cigarettes    Start date: 2012   Smokeless tobacco: Never  Vaping Use   Vaping Use: Never used  Substance and Sexual Activity   Alcohol use: Yes    Comment: occassional    Drug use: Never   Sexual activity: Yes  Other Topics Concern   Not on file  Social History Narrative   Not on file   Social Determinants of Health   Financial Resource Strain: Not on file  Food Insecurity: Not on file  Transportation Needs: Not on file  Physical Activity: Not on file  Stress: Not on file  Social Connections: Not on file  Intimate Partner Violence: Not on file   Family History  Adopted: Yes  Problem Relation Age of Onset   Breast cancer Neg Hx    Current Outpatient Medications on File Prior to Visit  Medication Sig   Multiple Vitamins-Minerals (EYE-VITE PLUS LUTEIN PO) Take 1 capsule by mouth 2 (two) times daily. AREDS2   Multiple Vitamins-Minerals (MULTIVITAMIN WITH MINERALS) tablet Take 1 tablet by mouth daily.   omeprazole (PRILOSEC) 40 MG capsule Take 1 capsule (40 mg total) by mouth daily.   vitamin B-12 (CYANOCOBALAMIN) 1000 MCG tablet Take 1,000 mcg by mouth daily.   vitamin C (ASCORBIC ACID) 500 MG tablet Take 500 mg by mouth daily.   vitamin E 180 MG (400 UNITS) capsule Take 400 Units by mouth daily.   [DISCONTINUED] prochlorperazine (COMPAZINE) 10 MG tablet Take 1 tablet (10 mg total) by mouth every 6 (six) hours as needed (Nausea or vomiting). (Patient not taking: Reported on 10/20/2020)   No current facility-administered medications on file prior to visit.    Review of Systems Per HPI unless specifically indicated above     Objective:    BP 124/82   Pulse 76   Ht '5\' 1"'$  (1.549 m)   Wt 190 lb (86.2 kg)   SpO2 100%   BMI 35.90 kg/m   Wt Readings from Last 3 Encounters:  04/10/22 190 lb (86.2 kg)  04/05/22 192 lb (87.1 kg)  03/21/22 193 lb (87.5 kg)    Physical Exam Vitals and nursing note reviewed.   Constitutional:      General: She is not in acute distress.    Appearance: Normal appearance. She is well-developed. She is obese. She is not diaphoretic.     Comments: Well-appearing, comfortable, cooperative  HENT:     Head: Normocephalic and atraumatic.  Eyes:     General:        Right eye: No discharge.        Left eye: No discharge.     Conjunctiva/sclera: Conjunctivae normal.  Cardiovascular:     Rate and Rhythm: Normal rate.  Pulmonary:     Effort: Pulmonary effort is normal.  Skin:    General: Skin is warm and dry.     Findings: No erythema or rash.  Neurological:     Mental Status: She is alert and oriented to person,  place, and time.  Psychiatric:        Mood and Affect: Mood normal.        Behavior: Behavior normal.        Thought Content: Thought content normal.     Comments: Well groomed, good eye contact, normal speech and thoughts      Results for orders placed or performed in visit on 04/05/22  POCT Urinalysis Dipstick  Result Value Ref Range   Color, UA Yellow    Clarity, UA Clear    Glucose, UA Negative Negative   Bilirubin, UA Negative    Ketones, UA Negative    Spec Grav, UA >=1.030 (A) 1.010 - 1.025   Blood, UA Negative    pH, UA 5.5 5.0 - 8.0   Protein, UA Negative Negative   Urobilinogen, UA 0.2 0.2 or 1.0 E.U./dL   Nitrite, UA Negative    Leukocytes, UA Negative Negative   Appearance     Odor        Assessment & Plan:   Problem List Items Addressed This Visit     Chronic dryness of both eyes   GERD (gastroesophageal reflux disease)   History of cancer of left breast   Primary osteoarthritis involving multiple joints   Severe obesity (BMI 35.0-39.9) with comorbidity ([place]) - Primary   Urinary, incontinence, stress female   Other Visit Diagnoses     Encounter to establish care with new doctor           Establish care with new PCP  Reviewed past records and background history  #L Breast Cancer, history S/p lumpectomy,  chemotherapy, radiation Followed by [facility] and [facility] for surveillance and management  #Obesity BMI >40 Complication after therapy chemo/radiation, has had difficulty with weight management Significant wt gain in past 1 year, difficulty losing weight Failed Saxenda due to nausea vomiting Consider Wegovy vs Zepbound GLP/GIP therapy Consider Contrave Notify me on mychart when reviewed insurance coverage etc.  #GERD Controlled on PPI therapy, Omeprazole '40mg'$  Continue - notify when ready for refills  #Stress Urinary Incontinence Followed now recently by Dr Tandy Gaw [place] Upcoming Pelvic Floor PT   No orders of the defined types were placed in this encounter.     Follow up plan: Return in about 3 months (around 07/10/2022) for 3 month follow-up Weight Management.  Wendy Caldwell, [facility] 04/10/2022, 10:27 AM

## 2022-04-12 MED ORDER — ZEPBOUND 2.5 MG/0.5ML ~~LOC~~ SOAJ: 2.5000 mg | SUBCUTANEOUS | 1 refills | Status: DC

## 2022-04-13 NOTE — Telephone Encounter (Signed)
I have submitted the PA.

## 2022-04-19 DIAGNOSIS — H16233 Neurotrophic keratoconjunctivitis, bilateral: Secondary | ICD-10-CM | POA: Diagnosis not present

## 2022-04-19 DIAGNOSIS — H18891 Other specified disorders of cornea, right eye: Secondary | ICD-10-CM | POA: Diagnosis not present

## 2022-04-24 DIAGNOSIS — H16233 Neurotrophic keratoconjunctivitis, bilateral: Secondary | ICD-10-CM | POA: Diagnosis not present

## 2022-04-24 DIAGNOSIS — H18892 Other specified disorders of cornea, left eye: Secondary | ICD-10-CM | POA: Diagnosis not present

## 2022-05-01 ENCOUNTER — Encounter: Payer: Self-pay | Admitting: *Deleted

## 2022-05-03 MED ORDER — OMEPRAZOLE 40 MG PO CPDR: 40.0000 mg | DELAYED_RELEASE_CAPSULE | Freq: Every day | ORAL | 3 refills | Status: DC

## 2022-05-03 MED ORDER — ZEPBOUND 2.5 MG/0.5ML ~~LOC~~ SOAJ: 2.5000 mg | SUBCUTANEOUS | 1 refills | Status: DC

## 2022-05-03 NOTE — Addendum Note (Signed)
Addended by: Olin Hauser on: 05/03/2022 03:44 PM   Modules accepted: Orders

## 2022-05-03 NOTE — Addendum Note (Signed)
Addended by: Olin Hauser on: 05/03/2022 12:54 PM   Modules accepted: Orders

## 2022-05-15 ENCOUNTER — Other Ambulatory Visit: Payer: Self-pay | Admitting: General Surgery

## 2022-05-15 DIAGNOSIS — Z853 Personal history of malignant neoplasm of breast: Secondary | ICD-10-CM

## 2022-05-23 DIAGNOSIS — H2513 Age-related nuclear cataract, bilateral: Secondary | ICD-10-CM | POA: Diagnosis not present

## 2022-05-23 DIAGNOSIS — H353132 Nonexudative age-related macular degeneration, bilateral, intermediate dry stage: Secondary | ICD-10-CM | POA: Diagnosis not present

## 2022-05-23 DIAGNOSIS — H16233 Neurotrophic keratoconjunctivitis, bilateral: Secondary | ICD-10-CM | POA: Diagnosis not present

## 2022-06-19 ENCOUNTER — Ambulatory Visit: Payer: BC Managed Care – PPO | Admitting: Family Medicine

## 2022-06-30 ENCOUNTER — Ambulatory Visit
Admission: RE | Admit: 2022-06-30 | Discharge: 2022-06-30 | Disposition: A | Discharge status: Home / Self Care | Payer: BC Managed Care – PPO | Source: Ambulatory Visit | Attending: General Surgery | Admitting: General Surgery

## 2022-06-30 DIAGNOSIS — Z853 Personal history of malignant neoplasm of breast: Secondary | ICD-10-CM

## 2022-06-30 DIAGNOSIS — R92333 Mammographic heterogeneous density, bilateral breasts: Secondary | ICD-10-CM | POA: Diagnosis not present

## 2022-07-07 DIAGNOSIS — C50412 Malignant neoplasm of upper-outer quadrant of left female breast: Secondary | ICD-10-CM | POA: Diagnosis not present

## 2022-07-25 ENCOUNTER — Inpatient Hospital Stay: Payer: BC Managed Care – PPO | Attending: Oncology | Admitting: Oncology

## 2022-07-25 ENCOUNTER — Encounter: Payer: Self-pay | Admitting: Oncology

## 2022-07-25 VITALS — BP 113/72 | HR 71 | Temp 98.4°F | Resp 18 | Ht 61.0 in | Wt 175.6 lb

## 2022-07-25 DIAGNOSIS — Z853 Personal history of malignant neoplasm of breast: Secondary | ICD-10-CM

## 2022-07-25 DIAGNOSIS — Z08 Encounter for follow-up examination after completed treatment for malignant neoplasm: Secondary | ICD-10-CM | POA: Diagnosis not present

## 2022-07-25 DIAGNOSIS — D563 Thalassemia minor: Secondary | ICD-10-CM | POA: Insufficient documentation

## 2022-07-25 NOTE — Progress Notes (Signed)
Hematology/Oncology Consult note [facility]  [phone] [phone]  Patient Care Team: Smitty Cords, DO as PCP - General (Family Medicine) Jim Like, RN as Oncology Nurse Navigator Carmina Miller, MD as Consulting Physician (Radiation Oncology) Creig Hines, MD as Consulting Physician (Oncology) Carolan Shiver, MD as Consulting Physician (General Surgery)   Name of the patient: Wendy Caldwell  [id number]  [birth year]   Date of visit: 07/25/22  Diagnosis-  left breast cancer clinical prognostic stage IIA cT2 N0 M0 ER weakly positive, 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  She self palpated a left breast mass which prompted a bilateral diagnostic mammogram on 06/25/2020.  That showed an irregular hypoechoic mass 2.5 x 1.7 x 1.8 cm at 1 o'clock position 1 cm from the nipple.  3 cm from the nipple were benign cysts.  1 mildly enlarged left axillary lymph node with cortical thickening 4 mm.  Both the breast mass and the lymph node were biopsied.  Left breast biopsy was consistent with invasive mammary carcinoma grade 3 ER weakly +1 to 10%, PR negative and HER2 positive by IHC.   Patient completed 4 cycles of neoadjuvant TCHP chemotherapy which was complicated by worsening skin rash and fatigue.  Patient did not wish to proceed with 2 further cycles of chemotherapy and proceeded with lumpectomy and sentinel lymph node biopsy.  Final pathology showed complete pathological response on 10/26/2020.  2 sentinel lymph nodes negative for malignancy.   Patient is completed adjuvant radiation treatment and is currently on adjuvant Herceptin and Perjeta.  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  Denies any breast concerns.  Appetite is good and she is intentionally trying to lose weight.  ECOG PS- 0 Pain scale- 0 Opioid associated constipation- no  Review of systems- Review of Systems  Constitutional:  Negative for chills, fever, malaise/fatigue and weight loss.  HENT:  Negative for congestion, ear discharge and nosebleeds.   Eyes:  Negative for blurred vision.  Respiratory:  Negative for cough, hemoptysis, sputum production, shortness of breath and wheezing.   Cardiovascular:  Negative for chest pain, palpitations, orthopnea and claudication.  Gastrointestinal:  Negative for abdominal pain, blood in stool, constipation, diarrhea, heartburn, melena, nausea and vomiting.  Genitourinary:  Negative for dysuria, flank pain, frequency, hematuria and urgency.  Musculoskeletal:  Negative for back pain, joint pain and myalgias.  Skin:  Negative for rash.  Neurological:  Negative for dizziness, tingling, focal weakness, seizures, weakness and headaches.  Endo/Heme/Allergies:  Does not bruise/bleed easily.  Psychiatric/Behavioral:  Negative for depression and suicidal ideas. The patient does not have insomnia.       No Known Allergies   Past Medical History:  Diagnosis Date   Breast Cancer    Family history of adverse reaction to anesthesia    adopted   GERD (gastroesophageal reflux disease)      Past Surgical History:  Procedure Laterality Date   BREAST BIOPSY Left 07/05/2020   [place] Bx, Q clip, [facility]   BREAST BIOPSY Left 07/05/2020   [place] Bx, [facility], benign breast tissue with increased stromal  fibrosis   BREAST LUMPECTOMY Left 10/26/2020   CARPAL TUNNEL RELEASE Bilateral    CHOLECYSTECTOMY     GANGLION CYST EXCISION     wrist   PART MASTECTOMY,RADIO FREQUENCY LOCALIZER,AXILLARY SENTINEL NODE BIOPSY Left  10/26/2020   Procedure: PART MASTECTOMY,RADIO FREQUENCY LOCALIZER,AXILLARY SENTINEL NODE BIOPSY;  Surgeon: Carolan Shiver, MD;  Location: [facility] ORS;  Service: General;  Laterality: Left;   PORTACATH PLACEMENT Right 07/21/2020   Procedure: INSERTION PORT-A-CATH;  Surgeon: Carolan Shiver, MD;  Location: [facility] ORS;  Service: General;  Laterality: Right;   right ovary removal      Social History   Socioeconomic History   Marital status: Married    Spouse name: Not on file   Number of children: Not on file   Years of education: Not on file   Highest education level: Not on file  Occupational History   Not on file  Tobacco Use   Smoking status: Former    Types: Cigarettes    Start date: 2012   Smokeless tobacco: Never  Vaping Use   Vaping Use: Never used  Substance and Sexual Activity   Alcohol use: Yes    Comment: occassional    Drug use: Never   Sexual activity: Yes  Other Topics Concern   Not on file  Social History Narrative   Not on file   Social Determinants of Health   Financial Resource Strain: Not on file  Food Insecurity: Not on file  Transportation Needs: Not on file  Physical Activity: Not on file  Stress: Not on file  Social Connections: Not on file  Intimate Partner Violence: Not on file    Family History  Adopted: Yes  Problem Relation Age of Onset   Breast cancer Neg Hx      Current Outpatient Medications:    Adalimumab 40 MG/0.8ML PSKT, Inject into the skin., Disp: , Rfl:    loratadine-pseudoephedrine (CLARITIN-D 24-HOUR) 10-240 MG 24 hr tablet, Take 1 tablet by mouth daily., Disp: , Rfl:    MIEBO 1.338 GM/ML SOLN, Apply to eye., Disp: , Rfl:    Multiple Vitamins-Minerals (EYE-VITE PLUS LUTEIN PO), Take 1 capsule by mouth 2 (two) times daily. AREDS2, Disp: , Rfl:    Multiple Vitamins-Minerals (MULTIVITAMIN WITH MINERALS) tablet, Take 1 tablet by mouth daily., Disp: , Rfl:    omeprazole (PRILOSEC) 40 MG capsule, Take 1 capsule (40 mg  total) by mouth daily before breakfast., Disp: 90 capsule, Rfl: 3   vitamin B-12 (CYANOCOBALAMIN) 1000 MCG tablet, Take 1,000 mcg by mouth daily., Disp: , Rfl:    vitamin C (ASCORBIC ACID) 500 MG tablet, Take 500 mg by mouth daily., Disp: , Rfl:    vitamin E 180 MG (400 UNITS) capsule, Take 400 Units by mouth daily., Disp: , Rfl:    ZEPBOUND 2.5 MG/0.5ML Pen, Inject 2.5 mg into the skin once a week., Disp: 6 mL, Rfl: 1  Physical exam:  Vitals:   07/25/22 1040  BP: 113/72  Pulse: 71  Resp: 18  Temp: 98.4 F (36.9 C)  TempSrc: Tympanic  SpO2: 100%  Weight: 175 lb 9.6 oz (79.7 kg)  Height: 5\' 1"  (1.549 m)   Physical Exam Cardiovascular:     Rate and Rhythm: Normal rate and regular rhythm.     Heart sounds: Normal heart sounds.  Pulmonary:     Effort: Pulmonary effort is normal.     Breath sounds: Normal breath sounds.  Abdominal:     General: Bowel sounds are normal.  Palpations: Abdomen is soft.  Skin:    General: Skin is warm and dry.  Neurological:     Mental Status: She is alert and oriented to person, place, and time.    Breast exam was performed in seated and lying down position. Patient is status post left lumpectomy with a well-healed surgical scar. No evidence of any palpable masses.  Left nipple is surgically absent.  No evidence of axillary adenopathy. No evidence of any palpable masses or lumps in the right breast. No evidence of right axillary adenopathy      Latest Ref Rng & Units 11/15/2021   10:04 AM  CMP  Glucose 70 - 99 mg/dL 161   BUN 8 - 23 mg/dL 20   Creatinine 0.96 - 1.00 mg/dL 0.45   Sodium 409 - 811 mmol/L 139   Potassium 3.5 - 5.1 mmol/L 4.0   Chloride 98 - 111 mmol/L 105   CO2 22 - 32 mmol/L 25   Calcium 8.9 - 10.3 mg/dL 9.1   Total Protein 6.5 - 8.1 g/dL 7.4   Total Bilirubin 0.3 - 1.2 mg/dL 0.7   Alkaline Phos 38 - 126 U/L 74   AST 15 - 41 U/L 21   ALT 0 - 44 U/L 20       Latest Ref Rng & Units 11/15/2021   10:04 AM  CBC  WBC 4.0 -  10.5 K/uL 7.6   Hemoglobin 12.0 - 15.0 g/dL 91.4   Hematocrit 78.2 - 46.0 % 39.5   Platelets 150 - 400 K/uL 293     No images are attached to the encounter.  MM DIAG BREAST TOMO BILATERAL  Result Date: 06/30/2022 CLINICAL DATA:  67 year old female presenting for routine annual surveillance status post left breast lumpectomy in August of 2022. EXAM: DIGITAL DIAGNOSTIC BILATERAL MAMMOGRAM WITH TOMOSYNTHESIS TECHNIQUE: Bilateral digital diagnostic mammography and breast tomosynthesis was performed. COMPARISON:  Previous exam(s). ACR Breast Density Category c: The breasts are heterogeneously dense, which may obscure small masses. FINDINGS: The left breast lumpectomy site is stable. No suspicious calcifications, masses or areas of distortion are seen in the bilateral breasts. IMPRESSION: Stable left breast lumpectomy site. No mammographic evidence of malignancy in the bilateral breasts. RECOMMENDATION: Diagnostic mammogram is suggested in 1 year. (Code:DM-B-01Y) I have discussed the findings and recommendations with the patient. If applicable, a reminder letter will be sent to the patient regarding the next appointment. BI-RADS CATEGORY  2: Benign. Electronically Signed   By: Frederico Hamman M.D.   On: 06/30/2022 13:22     Assessment and plan- Patient is a 67 y.o. female with clinical prognostic stage IIa T2 N0 M0 grade 3 invasive mammary carcinoma ER weakly positive, PR negative and HER2 positive.  She received 4 cycles of neoadjuvant TCHP chemotherapy with complete pathological response.  She is here for a routine follow-up visit  Clinically patient is doing well with no concerning signs and symptoms of recurrence based on Today's exam.  She had a mammogram in April 2024 which was unremarkable.  She is not on any endocrine therapy due to routine ER positive disease.  I will see her back in 6 months no labs.   Visit Diagnosis 1. Encounter for follow-up surveillance of breast cancer      Dr.  Owens Shark, MD, MPH [facility] at [facility] [phone] 07/25/2022 12:48 PM

## 2022-08-31 ENCOUNTER — Encounter: Payer: Self-pay | Admitting: Nurse Practitioner

## 2022-09-01 ENCOUNTER — Encounter: Payer: Self-pay | Admitting: Family Medicine

## 2022-09-18 ENCOUNTER — Encounter: Payer: Self-pay | Admitting: Family Medicine

## 2022-09-18 ENCOUNTER — Ambulatory Visit: Payer: BC Managed Care – PPO | Admitting: Family Medicine

## 2022-09-18 VITALS — BP 104/70 | HR 80 | Temp 98.0°F | Resp 18 | Ht 61.0 in | Wt 164.0 lb

## 2022-09-18 DIAGNOSIS — J324 Chronic pansinusitis: Secondary | ICD-10-CM

## 2022-09-18 NOTE — Patient Instructions (Addendum)
Thank you for coming to the office today.  Try the Afrin or generic oxymetazoline, follow bottle instructions few sprays short term spot treatment as needed in the evening preferred, avoid repeat usage, no 2nd dose after right away, can do 24-48 hours or more.  Keep up the good work with the Zepbound, finish out the current course, we can pause, let me know if refill in future.  Referral to ENT - hold off on imaging until they do their consultation and guide you further.  [facility] ENT [facility] [address] [place], [place] [address] Phone: [phone]  [facility] [address]  [place], [place] [address] Ph: [phone]  Please schedule a Follow-up Appointment to: Return if symptoms worsen or fail to improve.  If you have any other questions or concerns, please feel free to call the office or send a message through MyChart. You may also schedule an earlier appointment if necessary.  Additionally, you may be receiving a survey about your experience at our office within a few days to 1 week by e-mail or mail. We value your feedback.  Saralyn Pilar, DO [facility], [place]

## 2022-09-18 NOTE — Progress Notes (Signed)
Subjective:    Patient ID: Wendy Caldwell, female    DOB: June 29, 1955, 67 y.o.   MRN: [id number]  Alder Tubb is a 67 y.o. female presenting on 09/18/2022 for Sinus Problem (Pt complains of intermittent maxillary and frontal sinus pressure with difficulty breathing. The symptom started post chemotherapy treatment. The last time the symptoms occurred was 09/01/2022 and last few days.  ) and Weight Check (Pt currently taking Zepbound and also on a anti-inflammatory diet.)   HPI  Sinusitis, chronic Chronic problem with sinus swelling, not as much drainage or congestion, no actual mucus or drainage, mostly pressure. If lays down can relieve enough to breathe and sleep. Seems to be quick onset.  She has tried Flonase, Azelastine She has tried nasal saline and netty pot  Treatments anti histamines seem to be ineffective  Last chemo treatment 09/01/22, lasted 2-3 days and then resolved.  Obesity BMI >30 Weight loss 26-30 lbs in past 6 months Improved anti inflammatory diet, low carb, and on Zepbound. Goal is avg 150 lbs. She has 6 pens of the Zepbound 2.5mg  dose left, and she plans to maintain her weight naturally without Zepbound. She is on Omeprazole every other day.      04/10/2022   12:51 PM  Depression screen PHQ 2/9  Decreased Interest 0  Down, Depressed, Hopeless 0  PHQ - 2 Score 0    Social History   Tobacco Use   Smoking status: Former    Types: Cigarettes    Start date: 2012   Smokeless tobacco: Never  Vaping Use   Vaping Use: Never used  Substance Use Topics   Alcohol use: Yes    Comment: occassional    Drug use: Never    Review of Systems Per HPI unless specifically indicated above     Objective:    BP 104/70 (BP Location: Right Arm)   Pulse 80   Temp 98 F (36.7 C) (Oral)   Resp 18   Ht 5\' 1"  (1.549 m)   Wt 164 lb (74.4 kg)   SpO2 98%   BMI 30.99 kg/m   Wt Readings from Last 3 Encounters:  09/18/22 164 lb (74.4 kg)  07/25/22 175 lb  9.6 oz (79.7 kg)  04/10/22 190 lb (86.2 kg)    Physical Exam Vitals and nursing note reviewed.  Constitutional:      General: She is not in acute distress.    Appearance: Normal appearance. She is well-developed. She is not diaphoretic.     Comments: Well-appearing, comfortable, cooperative  HENT:     Head: Normocephalic and atraumatic.     Nose: No congestion.  Eyes:     General:        Right eye: No discharge.        Left eye: No discharge.     Conjunctiva/sclera: Conjunctivae normal.  Cardiovascular:     Rate and Rhythm: Normal rate.  Pulmonary:     Effort: Pulmonary effort is normal.  Skin:    General: Skin is warm and dry.     Findings: No erythema or rash.  Neurological:     Mental Status: She is alert and oriented to person, place, and time.  Psychiatric:        Mood and Affect: Mood normal.        Behavior: Behavior normal.        Thought Content: Thought content normal.     Comments: Well groomed, good eye contact, normal speech and thoughts  Results for orders placed or performed in visit on 04/05/22  POCT Urinalysis Dipstick  Result Value Ref Range   Color, UA Yellow    Clarity, UA Clear    Glucose, UA Negative Negative   Bilirubin, UA Negative    Ketones, UA Negative    Spec Grav, UA >=1.030 (A) 1.010 - 1.025   Blood, UA Negative    pH, UA 5.5 5.0 - 8.0   Protein, UA Negative Negative   Urobilinogen, UA 0.2 0.2 or 1.0 E.U./dL   Nitrite, UA Negative    Leukocytes, UA Negative Negative   Appearance     Odor        Assessment & Plan:   Problem List Items Addressed This Visit     Severe obesity (BMI 35.0-39.9) with comorbidity (HCC)   Other Visit Diagnoses     Chronic pansinusitis    -  Primary   Relevant Orders   Ambulatory referral to ENT       Chronic Pansinusitis Failed conservative therapy options previously Flonase, Azelastine.  Does not appear to be bacterial sinusitis  Try the Afrin or generic oxymetazoline, follow bottle  instructions few sprays short term spot treatment as needed in the evening preferred, avoid repeat usage, no 2nd dose after right away, can do 24-48 hours or more.  Referral to ENT for further eval, consider CT imaging sinus if indicated.  Obesity BMI >30 Keep up the good work with the Zepbound, finish out the current course 6 more doses, we can pause, let me know if refill in future.    Orders Placed This Encounter  Procedures   Ambulatory referral to ENT    Referral Priority:   Routine    Referral Type:   Consultation    Referral Reason:   Specialty Services Required    Requested Specialty:   Otolaryngology    Number of Visits Requested:   1     No orders of the defined types were placed in this encounter.     Follow up plan: Return if symptoms worsen or fail to improve.   Saralyn Pilar, DO [facility] 09/18/2022, 10:19 AM

## 2022-11-24 ENCOUNTER — Other Ambulatory Visit: Payer: Self-pay | Admitting: Family Medicine

## 2022-11-24 ENCOUNTER — Encounter: Payer: Self-pay | Admitting: Family Medicine

## 2022-11-24 MED ORDER — ZEPBOUND 2.5 MG/0.5ML ~~LOC~~ SOAJ
2.5000 mg | SUBCUTANEOUS | 0 refills | Status: DC
Start: 2022-11-24 — End: 2022-12-04

## 2022-11-24 NOTE — Addendum Note (Signed)
Addended by: Lorre Munroe on: 11/24/2022 01:37 PM   Modules accepted: Orders

## 2022-11-27 DIAGNOSIS — H16233 Neurotrophic keratoconjunctivitis, bilateral: Secondary | ICD-10-CM | POA: Diagnosis not present

## 2022-11-27 DIAGNOSIS — H2513 Age-related nuclear cataract, bilateral: Secondary | ICD-10-CM | POA: Diagnosis not present

## 2022-11-27 DIAGNOSIS — H353132 Nonexudative age-related macular degeneration, bilateral, intermediate dry stage: Secondary | ICD-10-CM | POA: Diagnosis not present

## 2022-12-04 MED ORDER — ZEPBOUND 2.5 MG/0.5ML ~~LOC~~ SOAJ
2.5000 mg | SUBCUTANEOUS | 0 refills | Status: DC
Start: 2022-12-04 — End: 2023-07-31

## 2022-12-04 NOTE — Addendum Note (Signed)
Addended by: Kavin Leech E on: 12/04/2022 02:04 PM   Modules accepted: Orders

## 2022-12-14 NOTE — Telephone Encounter (Signed)
Good afternoon Ms. Sitts,   I have submitted a prior authorization to you insurance.  I will let you know the decision once it comes back.   Thank you,   -Vernona Rieger

## 2023-01-22 ENCOUNTER — Encounter: Payer: Self-pay | Admitting: Oncology

## 2023-01-23 ENCOUNTER — Inpatient Hospital Stay: Payer: Medicare Other | Attending: Oncology | Admitting: Oncology

## 2023-01-23 ENCOUNTER — Inpatient Hospital Stay: Payer: Medicare Other

## 2023-01-23 ENCOUNTER — Encounter: Payer: Self-pay | Admitting: Oncology

## 2023-01-23 VITALS — BP 115/67 | HR 72 | Temp 94.9°F | Resp 18 | Ht 63.0 in | Wt 155.0 lb

## 2023-01-23 DIAGNOSIS — Z299 Encounter for prophylactic measures, unspecified: Secondary | ICD-10-CM

## 2023-01-23 DIAGNOSIS — Z08 Encounter for follow-up examination after completed treatment for malignant neoplasm: Secondary | ICD-10-CM

## 2023-01-23 DIAGNOSIS — Z23 Encounter for immunization: Secondary | ICD-10-CM

## 2023-01-23 DIAGNOSIS — Z853 Personal history of malignant neoplasm of breast: Secondary | ICD-10-CM | POA: Insufficient documentation

## 2023-01-23 MED ORDER — INFLUENZA VAC A&B SURF ANT ADJ 0.5 ML IM SUSY
0.5000 mL | PREFILLED_SYRINGE | Freq: Once | INTRAMUSCULAR | Status: AC
  Administered 2023-01-23: 0.5 mL via INTRAMUSCULAR
  Filled 2023-01-23: qty 0.5

## 2023-01-23 MED ORDER — INFLUENZA VAC A&B SURF ANT ADJ 0.5 ML IM SUSY: 0.5000 mL | PREFILLED_SYRINGE | Freq: Once | INTRAMUSCULAR | Status: DC

## 2023-01-23 NOTE — Progress Notes (Signed)
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  She self palpated a left breast mass which prompted a bilateral diagnostic mammogram on 06/25/2020.  That showed an irregular hypoechoic mass 2.5 x 1.7 x 1.8 cm at 1 o'clock position 1 cm from the nipple.  3 cm from the nipple were benign cysts.  1 mildly enlarged left axillary lymph node with cortical thickening 4 mm.  Both the breast mass and the lymph node were biopsied.  Left breast biopsy was consistent with invasive mammary carcinoma grade 3 ER weakly +1 to 10%, PR negative and HER2 positive by IHC.   Patient completed 4 cycles of neoadjuvant TCHP chemotherapy which was complicated by worsening skin rash and fatigue.  Patient did not wish to proceed with 2 further cycles of chemotherapy and proceeded with lumpectomy and sentinel lymph node biopsy.  Final pathology showed complete pathological response on 10/26/2020.  2 sentinel lymph nodes negative for malignancy.   Patient completed adjuvant radiation as well as adjuvant Herceptin and Perjeta for 1 year.  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  Denies any breast concerns.  No changes in her appetite or weight.  Denies any new aches and pains anywhere.  ECOG PS- 0 Pain scale- 0   Review of systems- Review of Systems  Constitutional:  Negative for chills, fever, malaise/fatigue and weight loss.  HENT:  Negative for congestion, ear discharge and nosebleeds.   Eyes:  Negative for blurred vision.  Respiratory:  Negative for cough, hemoptysis, sputum production, shortness of breath and wheezing.   Cardiovascular:  Negative for chest pain, palpitations, orthopnea and claudication.  Gastrointestinal:  Negative for abdominal pain, blood in stool, constipation, diarrhea, heartburn, melena, nausea and vomiting.  Genitourinary:  Negative for dysuria, flank pain, frequency, hematuria and urgency.  Musculoskeletal:  Negative for back pain, joint pain and myalgias.  Skin:  Negative for rash.  Neurological:  Negative for dizziness, tingling, focal weakness, seizures, weakness and headaches.  Endo/Heme/Allergies:  Does not bruise/bleed easily.  Psychiatric/Behavioral:  Negative for depression and suicidal ideas. The patient does not have insomnia.       No Known Allergies   Past Medical History:  Diagnosis Date   Breast Cancer    Family history of adverse reaction to anesthesia    adopted   GERD (gastroesophageal reflux disease)      Past Surgical History:  Procedure Laterality Date   BREAST BIOPSY Left 07/05/2020   [place] Bx, Q clip, [facility]   BREAST BIOPSY Left 07/05/2020   [place] Bx, [facility], benign breast tissue with increased stromal fibrosis   BREAST LUMPECTOMY  Left 10/26/2020   CARPAL TUNNEL RELEASE Bilateral    CHOLECYSTECTOMY     GANGLION CYST EXCISION     wrist   PART MASTECTOMY,RADIO FREQUENCY LOCALIZER,AXILLARY SENTINEL NODE BIOPSY Left 10/26/2020   Procedure: PART MASTECTOMY,RADIO FREQUENCY  LOCALIZER,AXILLARY SENTINEL NODE BIOPSY;  Surgeon: Carolan Shiver, MD;  Location: [facility] ORS;  Service: General;  Laterality: Left;   PORTACATH PLACEMENT Right 07/21/2020   Procedure: INSERTION PORT-A-CATH;  Surgeon: Carolan Shiver, MD;  Location: [facility] ORS;  Service: General;  Laterality: Right;   right ovary removal      Social History   Socioeconomic History   Marital status: Married    Spouse name: Not on file   Number of children: Not on file   Years of education: Not on file   Highest education level: Master's degree (e.g., MA, MS, MEng, MEd, MSW, MBA)  Occupational History   Not on file  Tobacco Use   Smoking status: Former    Types: Cigarettes    Start date: 2012   Smokeless tobacco: Never  Vaping Use   Vaping status: Never Used  Substance and Sexual Activity   Alcohol use: Yes    Comment: occassional    Drug use: Never   Sexual activity: Yes  Other Topics Concern   Not on file  Social History Narrative   Not on file   Social Determinants of Health   Financial Resource Strain: Low Risk  (09/17/2022)   Overall Financial Resource Strain (CARDIA)    Difficulty of Paying Living Expenses: Not hard at all  Food Insecurity: No Food Insecurity (09/17/2022)   Hunger Vital Sign    Worried About Running Out of Food in the Last Year: Never true    Ran Out of Food in the Last Year: Never true  Transportation Needs: No Transportation Needs (09/17/2022)   PRAPARE - Administrator, Civil Service (Medical): No    Lack of Transportation (Non-Medical): No  Physical Activity: Sufficiently Active (09/17/2022)   Exercise Vital Sign    Days of Exercise per Week: 5 days    Minutes of Exercise per Session: 40 min  Stress: No Stress Concern Present (09/17/2022)   [employer] of Occupational Health - Occupational Stress Questionnaire    Feeling of Stress : Not at all  Social Connections: Socially Integrated (09/17/2022)   Social Connection and Isolation Panel  [NHANES]    Frequency of Communication with Friends and Family: More than three times a week    Frequency of Social Gatherings with Friends and Family: Twice a week    Attends Religious Services: More than 4 times per year    Active Member of [employer] or Organizations: Yes    Attends Engineer, structural: More than 4 times per year    Marital Status: Married  Catering manager Violence: Not on file    Family History  Adopted: Yes  Problem Relation Age of Onset   Breast cancer Neg Hx      Current Outpatient Medications:    co-enzyme Q-10 30 MG capsule, Take 30 mg by mouth daily., Disp: , Rfl:    fish oil-omega-3 fatty acids 1000 MG capsule, Take 2 g by mouth daily., Disp: , Rfl:    MIEBO 1.338 GM/ML SOLN, Apply to eye., Disp: , Rfl:    Multiple Vitamins-Minerals (EYE-VITE PLUS LUTEIN PO), Take 1 capsule by mouth 2 (two) times daily. AREDS2, Disp: , Rfl:    Multiple Vitamins-Minerals (MULTIVITAMIN WITH MINERALS) tablet, Take 1 tablet by mouth daily.,  Disp: , Rfl:    omeprazole (PRILOSEC) 40 MG capsule, Take 1 capsule (40 mg total) by mouth daily before breakfast., Disp: 90 capsule, Rfl: 3   vitamin B-12 (CYANOCOBALAMIN) 1000 MCG tablet, Take 1,000 mcg by mouth daily., Disp: , Rfl:    vitamin C (ASCORBIC ACID) 500 MG tablet, Take 500 mg by mouth daily., Disp: , Rfl:    vitamin E 180 MG (400 UNITS) capsule, Take 400 Units by mouth daily., Disp: , Rfl:    Adalimumab 40 MG/0.8ML PSKT, Inject into the skin. (Patient not taking: Reported on 01/23/2023), Disp: , Rfl:    ZEPBOUND 2.5 MG/0.5ML Pen, Inject 2.5 mg into the skin once a week. (Patient not taking: Reported on 01/23/2023), Disp: 6 mL, Rfl: 0  Physical exam:  Vitals:   01/23/23 0952  BP: 115/67  Pulse: 72  Resp: 18  Temp: (!) 94.9 F (34.9 C)  TempSrc: Tympanic  SpO2: 98%  Weight: 155 lb (70.3 kg)  Height: 5\' 3"  (1.6 m)   Physical Exam Cardiovascular:     Rate and Rhythm: Normal rate and regular rhythm.     Heart  sounds: Normal heart sounds.  Pulmonary:     Effort: Pulmonary effort is normal.     Breath sounds: Normal breath sounds.  Skin:    General: Skin is warm and dry.  Neurological:     Mental Status: She is alert and oriented to person, place, and time.    Breast exam was performed in seated and lying down position. Patient is status post left lumpectomy with a well-healed surgical scar. No evidence of any palpable masses. No evidence of axillary adenopathy. No evidence of any palpable masses or lumps in the right breast. No evidence of right axillary adenopathy      Latest Ref Rng & Units 11/15/2021   10:04 AM  CMP  Glucose 70 - 99 mg/dL 161   BUN 8 - 23 mg/dL 20   Creatinine 0.96 - 1.00 mg/dL 0.45   Sodium 409 - 811 mmol/L 139   Potassium 3.5 - 5.1 mmol/L 4.0   Chloride 98 - 111 mmol/L 105   CO2 22 - 32 mmol/L 25   Calcium 8.9 - 10.3 mg/dL 9.1   Total Protein 6.5 - 8.1 g/dL 7.4   Total Bilirubin 0.3 - 1.2 mg/dL 0.7   Alkaline Phos 38 - 126 U/L 74   AST 15 - 41 U/L 21   ALT 0 - 44 U/L 20       Latest Ref Rng & Units 11/15/2021   10:04 AM  CBC  WBC 4.0 - 10.5 K/uL 7.6   Hemoglobin 12.0 - 15.0 g/dL 91.4   Hematocrit 78.2 - 46.0 % 39.5   Platelets 150 - 400 K/uL 293     No images are attached to the encounter.  No results found.   Assessment and plan- Patient is a 67 y.o. female with history of stage IIa invasive mammary carcinoma of the left breast ER weakly +1 to 10%, PR negative HER2 positive status post neoadjuvant chemotherapy followed by definitive lumpectomy and adjuvant radiation therapy.  This is a routine follow-up visit  This is your 3 of surveillance and clinically patient is doing well with no concerning signs and symptoms of recurrence based on today's exam.  She would be due for a mammogram in April 2025 which will be coordinated by Dr. Maia Plan.  I will see her back in 6 months with CBC with differential CMP.  Patient had a  weakly 1 to 10% PR positive tumor and  therefore chose not to proceed with endocrine therapy.  She will receive her flu shot today   Visit Diagnosis 1. Need for prophylactic measure   2. Encounter for immunization   3. Encounter for follow-up surveillance of breast cancer      Dr. Owens Shark, MD, MPH [facility] [phone] 01/23/2023 12:10 PM

## 2023-01-30 ENCOUNTER — Ambulatory Visit: Payer: BC Managed Care – PPO | Admitting: Oncology

## 2023-03-15 NOTE — Telephone Encounter (Signed)
 Error

## 2023-05-06 ENCOUNTER — Other Ambulatory Visit: Payer: Self-pay | Admitting: Family Medicine

## 2023-05-06 DIAGNOSIS — K219 Gastro-esophageal reflux disease without esophagitis: Secondary | ICD-10-CM

## 2023-05-08 NOTE — Telephone Encounter (Signed)
 Requested Prescriptions  Pending Prescriptions Disp Refills   omeprazole (PRILOSEC) 40 MG capsule [Pharmacy Med Name: OMEPRAZOLE 40MG  CAPSULES] 90 capsule 1    Sig: TAKE 1 CAPSULE BY MOUTH EVERY DAY BEFORE BREAKFAST     Gastroenterology: Proton Pump Inhibitors Passed - 05/08/2023  9:54 AM      Passed - Valid encounter within last 12 months    Recent Outpatient Visits           7 months ago Chronic pansinusitis   [place] [facility] [place], Netta Neat, DO   1 year ago Severe obesity (BMI 35.0-39.9) with comorbidity [facility])   [place] [facility] [place], Netta Neat, [place]

## 2023-07-23 ENCOUNTER — Encounter: Payer: Self-pay | Admitting: Oncology

## 2023-07-23 ENCOUNTER — Inpatient Hospital Stay (HOSPITAL_BASED_OUTPATIENT_CLINIC_OR_DEPARTMENT_OTHER): Payer: Medicare Other | Admitting: Oncology

## 2023-07-23 ENCOUNTER — Inpatient Hospital Stay: Payer: Medicare Other | Attending: Oncology

## 2023-07-23 VITALS — BP 109/73 | HR 77 | Temp 96.7°F | Resp 18 | Ht 63.0 in | Wt 167.6 lb

## 2023-07-23 DIAGNOSIS — Z853 Personal history of malignant neoplasm of breast: Secondary | ICD-10-CM | POA: Diagnosis not present

## 2023-07-23 DIAGNOSIS — R7989 Other specified abnormal findings of blood chemistry: Secondary | ICD-10-CM | POA: Diagnosis present

## 2023-07-23 DIAGNOSIS — Z08 Encounter for follow-up examination after completed treatment for malignant neoplasm: Secondary | ICD-10-CM

## 2023-07-23 DIAGNOSIS — D563 Thalassemia minor: Secondary | ICD-10-CM | POA: Insufficient documentation

## 2023-07-23 LAB — COMPREHENSIVE METABOLIC PANEL WITH GFR
ALT: 21 U/L (ref 0–44)
AST: 20 U/L (ref 15–41)
Albumin: 4 g/dL (ref 3.5–5.0)
Alkaline Phosphatase: 78 U/L (ref 38–126)
Anion gap: 9 (ref 5–15)
BUN: 18 mg/dL (ref 8–23)
CO2: 25 mmol/L (ref 22–32)
Calcium: 9.4 mg/dL (ref 8.9–10.3)
Chloride: 103 mmol/L (ref 98–111)
Creatinine, Ser: 0.88 mg/dL (ref 0.44–1.00)
GFR, Estimated: >60 mL/min (ref >60)
Glucose, Bld: 91 mg/dL (ref 70–99)
Potassium: 4.2 mmol/L (ref 3.5–5.1)
Sodium: 137 mmol/L (ref 135–145)
Total Bilirubin: 1 mg/dL (ref 0.0–1.2)
Total Protein: 7.4 g/dL (ref 6.5–8.1)

## 2023-07-23 LAB — CBC WITH DIFFERENTIAL/PLATELET
Abs Immature Granulocytes: 0.03 10*3/uL (ref 0.00–0.07)
Basophils Absolute: 0.1 10*3/uL (ref 0.0–0.1)
Basophils Relative: 1 %
Eosinophils Absolute: 0.3 10*3/uL (ref 0.0–0.5)
Eosinophils Relative: 4 %
HCT: 39.6 % (ref 36.0–46.0)
Hemoglobin: 12.6 g/dL (ref 12.0–15.0)
Immature Granulocytes: 1 %
Lymphocytes Relative: 27 %
Lymphs Abs: 1.8 10*3/uL (ref 0.7–4.0)
MCH: 20.2 pg — ABNORMAL LOW (ref 26.0–34.0)
MCHC: 31.8 g/dL (ref 30.0–36.0)
MCV: 63.6 fL — ABNORMAL LOW (ref 80.0–100.0)
Monocytes Absolute: 0.7 10*3/uL (ref 0.1–1.0)
Monocytes Relative: 11 %
Neutro Abs: 3.7 10*3/uL (ref 1.7–7.7)
Neutrophils Relative %: 56 %
Platelets: 314 10*3/uL (ref 150–400)
RBC: 6.23 MIL/uL — ABNORMAL HIGH (ref 3.87–5.11)
RDW: 15.5 % (ref 11.5–15.5)
Smear Review: NORMAL
WBC: 6.5 10*3/uL (ref 4.0–10.5)
nRBC: 0 % (ref 0.0–0.2)

## 2023-07-23 NOTE — Progress Notes (Addendum)
 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  She self palpated a left breast mass which prompted a bilateral diagnostic mammogram on 06/25/2020.  That showed an irregular hypoechoic mass 2.5 x 1.7 x 1.8 cm at 1 o'clock position 1 cm from the nipple.  3 cm from the nipple were benign cysts.  1 mildly enlarged left axillary lymph node with cortical thickening 4 mm.  Both the breast mass and the lymph node were biopsied.  Left breast biopsy was consistent with invasive mammary carcinoma grade 3 ER weakly +1 to 10%, PR negative and HER2 positive by IHC.   Patient completed 4 cycles of neoadjuvant TCHP chemotherapy which was complicated by worsening skin rash and fatigue.  Patient did not wish to proceed with 2 further cycles of chemotherapy and proceeded with lumpectomy and sentinel lymph node biopsy.  Final pathology showed complete pathological response on 10/26/2020.  2 sentinel lymph nodes negative for malignancy.   Patient completed adjuvant radiation as well as adjuvant Herceptin  and Perjeta  for 1 year.  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  Denies any changes in her appetite or weight.  She does not wish to get screening mammograms at this point  ECOG PS- 0 Pain scale- 0   Review of systems- Review of Systems  Constitutional:  Negative for chills, fever, malaise/fatigue and weight loss.  HENT:  Negative for congestion, ear discharge and nosebleeds.   Eyes:  Negative for blurred vision.  Respiratory:  Negative for cough, hemoptysis, sputum production, shortness of breath and wheezing.   Cardiovascular:  Negative for chest pain, palpitations, orthopnea and claudication.  Gastrointestinal:  Negative for abdominal pain, blood in stool, constipation, diarrhea, heartburn, melena, nausea and vomiting.  Genitourinary:  Negative for dysuria, flank pain, frequency, hematuria and urgency.  Musculoskeletal:  Negative for back pain, joint pain and myalgias.  Skin:  Negative for rash.  Neurological:  Negative for dizziness, tingling, focal weakness, seizures, weakness and headaches.  Endo/Heme/Allergies:  Does not bruise/bleed easily.  Psychiatric/Behavioral:  Negative for depression and suicidal ideas. The patient does not have insomnia.       No Known Allergies   Past Medical History:  Diagnosis Date   Breast Cancer    Family history of adverse reaction to anesthesia    adopted   GERD (gastroesophageal reflux disease)      Past Surgical History:  Procedure Laterality Date   BREAST BIOPSY Left 07/05/2020   US  Bx, Q clip, [facility]   BREAST BIOPSY Left 07/05/2020   US  Bx, [facility], benign breast  tissue with increased stromal fibrosis   BREAST LUMPECTOMY Left 10/26/2020   CARPAL TUNNEL RELEASE Bilateral    CHOLECYSTECTOMY     GANGLION CYST EXCISION     wrist   PART MASTECTOMY,RADIO FREQUENCY LOCALIZER,AXILLARY SENTINEL NODE BIOPSY Left 10/26/2020    Procedure: PART MASTECTOMY,RADIO FREQUENCY LOCALIZER,AXILLARY SENTINEL NODE BIOPSY;  Surgeon: Eldred Grego, MD;  Location: [facility] ORS;  Service: General;  Laterality: Left;   PORTACATH PLACEMENT Right 07/21/2020   Procedure: INSERTION PORT-A-CATH;  Surgeon: Eldred Grego, MD;  Location: [facility] ORS;  Service: General;  Laterality: Right;   right ovary removal      Social History   Socioeconomic History   Marital status: Married    Spouse name: Not on file   Number of children: Not on file   Years of education: Not on file   Highest education level: Master's degree (e.g., MA, MS, MEng, MEd, MSW, MBA)  Occupational History   Not on file  Tobacco Use   Smoking status: Former    Types: Cigarettes    Start date: 2012   Smokeless tobacco: Never  Vaping Use   Vaping status: Never Used  Substance and Sexual Activity   Alcohol use: Yes    Comment: occassional    Drug use: Never   Sexual activity: Yes  Other Topics Concern   Not on file  Social History Narrative   Not on file   Social Drivers of Health   Financial Resource Strain: Low Risk  (09/17/2022)   Overall Financial Resource Strain (CARDIA)    Difficulty of Paying Living Expenses: Not hard at all  Food Insecurity: No Food Insecurity (09/17/2022)   Hunger Vital Sign    Worried About Running Out of Food in the Last Year: Never true    Ran Out of Food in the Last Year: Never true  Transportation Needs: No Transportation Needs (09/17/2022)   PRAPARE - Administrator, Civil Service (Medical): No    Lack of Transportation (Non-Medical): No  Physical Activity: Sufficiently Active (09/17/2022)   Exercise Vital Sign    Days of Exercise per Week: 5 days    Minutes of Exercise per Session: 40 min  Stress: No Stress Concern Present (09/17/2022)   [employer] of Occupational Health - Occupational Stress Questionnaire    Feeling of Stress : Not at all  Social Connections: Socially Integrated (09/17/2022)   Social  Connection and Isolation Panel [NHANES]    Frequency of Communication with Friends and Family: More than three times a week    Frequency of Social Gatherings with Friends and Family: Twice a week    Attends Religious Services: More than 4 times per year    Active Member of [employer] or Organizations: Yes    Attends Engineer, structural: More than 4 times per year    Marital Status: Married  Catering manager Violence: Not on file    Family History  Adopted: Yes  Problem Relation Age of Onset   Breast cancer Neg Hx      Current Outpatient Medications:    Adalimumab 40 MG/0.8ML PSKT, Inject into the skin. (Patient not taking: Reported on 01/23/2023), Disp: , Rfl:    co-enzyme Q-10 30 MG capsule, Take 30 mg by mouth daily., Disp: , Rfl:    fish oil-omega-3 fatty acids 1000 MG capsule, Take 2 g by mouth daily., Disp: , Rfl:    MIEBO 1.338 GM/ML SOLN, Apply to eye., Disp: , Rfl:    Multiple Vitamins-Minerals (EYE-VITE PLUS LUTEIN  PO), Take 1 capsule by mouth 2 (two) times daily. AREDS2, Disp: , Rfl:    Multiple Vitamins-Minerals (MULTIVITAMIN WITH MINERALS) tablet, Take 1 tablet by mouth daily., Disp: , Rfl:    omeprazole  (PRILOSEC) 40 MG capsule, TAKE 1 CAPSULE BY MOUTH EVERY DAY BEFORE BREAKFAST, Disp: 90 capsule, Rfl: 1   vitamin B-12 (CYANOCOBALAMIN) 1000 MCG tablet, Take 1,000 mcg by mouth daily., Disp: , Rfl:    vitamin C (ASCORBIC ACID) 500 MG tablet, Take 500 mg by mouth daily., Disp: , Rfl:    vitamin E 180 MG (400 UNITS) capsule, Take 400 Units by mouth daily., Disp: , Rfl:    ZEPBOUND  2.5 MG/0.5ML Pen, Inject 2.5 mg into the skin once a week. (Patient not taking: Reported on 01/23/2023), Disp: 6 mL, Rfl: 0  Physical exam:  Vitals:   07/23/23 1039  BP: 109/73  Pulse: 77  Resp: 18  Temp: (!) 96.7 F (35.9 C)  TempSrc: Tympanic  SpO2: 99%  Weight: 167 lb 9.6 oz (76 kg)  Height: 5\' 3"  (1.6 m)   Physical Exam Cardiovascular:     Rate and Rhythm: Normal rate and  regular rhythm.     Heart sounds: Normal heart sounds.  Pulmonary:     Effort: Pulmonary effort is normal.     Breath sounds: Normal breath sounds.  Skin:    General: Skin is warm and dry.  Neurological:     Mental Status: She is alert and oriented to person, place, and time.    Breast exam was performed in seated and lying down position. Patient is status post left lumpectomy with a well-healed surgical scar. No evidence of any palpable masses.  Left nipple is chronically inverted.  No evidence of axillary adenopathy. No evidence of any palpable masses or lumps in the right breast. No evidence of right axillary adenopathy   I have personally reviewed labs listed below:    Latest Ref Rng & Units 07/23/2023   10:25 AM  CMP  Glucose 70 - 99 mg/dL 91   BUN 8 - 23 mg/dL 18   Creatinine 1.61 - 1.00 mg/dL 0.96   Sodium 045 - 409 mmol/L 137   Potassium 3.5 - 5.1 mmol/L 4.2   Chloride 98 - 111 mmol/L 103   CO2 22 - 32 mmol/L 25   Calcium 8.9 - 10.3 mg/dL 9.4   Total Protein 6.5 - 8.1 g/dL 7.4   Total Bilirubin 0.0 - 1.2 mg/dL 1.0   Alkaline Phos 38 - 126 U/L 78   AST 15 - 41 U/L 20   ALT 0 - 44 U/L 21       Latest Ref Rng & Units 07/23/2023   10:25 AM  CBC  WBC 4.0 - 10.5 K/uL 6.5   Hemoglobin 12.0 - 15.0 g/dL 81.1   Hematocrit 91.4 - 46.0 % 39.6   Platelets 150 - 400 K/uL 314     Assessment and plan- Patient is a 68 y.o. female with history of stage II aT2 N0 M0 ER weakly positive PR negative HER2 positive left breast cancer s/p neoadjuvant chemotherapy followed by left lumpectomy and adjuvant radiation therapy with complete pathological response.  She is here for routine follow-up visit  Clinically patient is doing well with no concerning signs and symptoms of recurrence based on today's exam.  She is not on endocrine therapy for weakly ER positive tumor.  She was supposed to get a mammogram last month but has decided to hold off for now.  I  will see her back in 6 months no labs.   She has baseline microcytosis without overt anemia secondary to beta thalassemia minor   Visit Diagnosis 1. Encounter for follow-up surveillance of breast cancer      Dr. Seretha Dance, MD, MPH [facility] at [facility] [phone] 07/23/2023 3:53 PM

## 2023-07-28 ENCOUNTER — Encounter: Payer: Self-pay | Admitting: Family Medicine

## 2023-07-30 ENCOUNTER — Other Ambulatory Visit: Payer: Self-pay | Admitting: Oncology

## 2023-07-30 DIAGNOSIS — Z853 Personal history of malignant neoplasm of breast: Secondary | ICD-10-CM

## 2023-07-31 ENCOUNTER — Other Ambulatory Visit: Payer: Self-pay | Admitting: Family Medicine

## 2023-07-31 ENCOUNTER — Ambulatory Visit (INDEPENDENT_AMBULATORY_CARE_PROVIDER_SITE_OTHER): Admitting: Family Medicine

## 2023-07-31 ENCOUNTER — Encounter: Payer: Self-pay | Admitting: Family Medicine

## 2023-07-31 VITALS — BP 122/70 | HR 81 | Ht 63.0 in | Wt 168.2 lb

## 2023-07-31 DIAGNOSIS — Z0000 Encounter for general adult medical examination without abnormal findings: Secondary | ICD-10-CM

## 2023-07-31 DIAGNOSIS — Z780 Asymptomatic menopausal state: Secondary | ICD-10-CM

## 2023-07-31 NOTE — Patient Instructions (Addendum)
 Thank you for coming to the office today.   These are the goals we discussed:  Goals   None     This is a list of the screening recommended for you and due dates:  Health Maintenance  Topic Date Due   Pneumonia Vaccine (1 of 2 - PCV) Never done   Pap with HPV screening  Never done   Cologuard (Stool DNA test)  Never done   Zoster (Shingles) Vaccine (2 of 2) 02/07/2019   DEXA scan (bone density measurement)  Never done   COVID-19 Vaccine (4 - 2024-25 season) 08/16/2023*   HIV Screening  07/30/2024*   Flu Shot  10/12/2023   Mammogram  06/29/2024   Medicare Annual Wellness Visit  07/30/2024   DTaP/Tdap/Td vaccine (2 - Td or Tdap) 12/12/2028   Hepatitis C Screening  Completed   HPV Vaccine  Aged Out   Meningitis B Vaccine  Aged Out  *Topic was postponed. The date shown is not the original due date.      Please schedule a Follow-up Appointment to: Return in about 29 days (around 08/29/2023) for 6/18 annual physical.  If you have any other questions or concerns, please feel free to call the office or send a message through MyChart. You may also schedule an earlier appointment if necessary.  Additionally, you may be receiving a survey about your experience at our office within a few days to 1 week by e-mail or mail. We value your feedback.  Domingo Friend, DO [facility], [place]

## 2023-07-31 NOTE — Progress Notes (Signed)
 Annual Wellness Visit     Patient: Wendy Caldwell, Female    DOB: 08-13-1955, 68 y.o.   MRN: [id number]  Subjective  No chief complaint on file.  HPI  Reviewed past history on chart. No acute problems discussed today She has declined EKG  Medications: Outpatient Medications Prior to Visit  Medication Sig   b complex vitamins capsule Take 1 capsule by mouth daily.   co-enzyme Q-10 30 MG capsule Take 30 mg by mouth daily.   fish oil-omega-3 fatty acids 1000 MG capsule Take 2 g by mouth daily.   MIEBO 1.338 GM/ML SOLN Apply to eye.   Multiple Vitamins-Minerals (EYE-VITE PLUS LUTEIN PO) Take 1 capsule by mouth 2 (two) times daily. AREDS2   Multiple Vitamins-Minerals (MULTIVITAMIN WITH MINERALS) tablet Take 1 tablet by mouth daily.   omeprazole  (PRILOSEC) 40 MG capsule TAKE 1 CAPSULE BY MOUTH EVERY DAY BEFORE BREAKFAST   vitamin B-12 (CYANOCOBALAMIN) 1000 MCG tablet Take 1,000 mcg by mouth daily.   vitamin C (ASCORBIC ACID) 500 MG tablet Take 500 mg by mouth daily.   vitamin E 180 MG (400 UNITS) capsule Take 400 Units by mouth daily.   [DISCONTINUED] Adalimumab 40 MG/0.8ML PSKT Inject into the skin. (Patient not taking: Reported on 01/23/2023)   [DISCONTINUED] ZEPBOUND  2.5 MG/0.5ML Pen Inject 2.5 mg into the skin once a week. (Patient not taking: Reported on 01/23/2023)   No facility-administered medications prior to visit.    No Known Allergies  Patient Care Team: Raina Bunting, DO as PCP - General (Family Medicine) Burnie Cartwright, RN as Oncology Nurse Navigator Glenis Langdon, MD as Consulting Physician (Radiation Oncology) Avonne Boettcher, MD as Consulting Physician (Oncology) Eldred Grego, MD as Consulting Physician (General Surgery)  ROS      Objective  BP 122/70 (BP Location: Left Arm, Patient Position: Sitting, Cuff Size: Normal)   Pulse 81   Ht 5\' 3"  (1.6 m)   Wt 168 lb 4 oz (76.3 kg)   SpO2 99%   BMI 29.80 kg/m     Physical  Exam  Most recent functional status assessment:    07/30/2023    3:34 PM  In your present state of health, do you have any difficulty performing the following activities:  Hearing? 0  Vision? 0  Difficulty concentrating or making decisions? 0  Walking or climbing stairs? 0  Dressing or bathing? 0  Doing errands, shopping? 0  Preparing Food and eating ? N  Using the Toilet? N  In the past six months, have you accidently leaked urine? N  Do you have problems with loss of bowel control? N  Managing your Medications? N  Managing your Finances? N  Housekeeping or managing your Housekeeping? N   Most recent fall risk assessment:    07/30/2023    3:34 PM  Fall Risk   Falls in the past year? 0  Number falls in past yr: 0  Injury with Fall? 0    Most recent depression screenings:    04/10/2022   12:51 PM  PHQ 2/9 Scores  PHQ - 2 Score 0   Most recent cognitive screening:    07/31/2023    6:13 PM  6CIT Screen  What Year? 0 points  What month? 0 points  What time? 0 points  Count back from 20 0 points  Months in reverse 0 points  Repeat phrase 0 points  Total Score 0 points   Most recent Audit-C alcohol use screening    07/30/2023  3:38 PM  Alcohol Use Disorder Test (AUDIT)  1. How often do you have a drink containing alcohol? 2  2. How many drinks containing alcohol do you have on a typical day when you are drinking? 0  3. How often do you have six or more drinks on one occasion? 0  AUDIT-C Score 2      Patient-reported    Vision/Hearing Screen: No results found.    No results found for any visits on 07/31/23.    Assessment & Plan   Annual wellness visit done today including the all of the following: Reviewed patient's Family Medical History Reviewed and updated list of patient's medical providers Assessment of cognitive impairment was done Assessed patient's functional ability Established a written schedule for health screening services Health Risk  Assessent Completed and Reviewed  Advanced Directives. Her husband is HPCOA and has Living Will  Exercise Activities and Dietary recommendations  Goals   None     Immunization History  Administered Date(s) Administered   Fluad Trivalent(High Dose 65+) 01/23/2023   Influenza Split 01/18/2017, 12/13/2018   Influenza, Quadrivalent, Recombinant, Inj, Pf 11/30/2017   Influenza, Seasonal, Injecte, Preservative Fre 01/18/2017, 12/13/2018   Influenza,inj,Quad PF,6+ Mos 01/20/2016, 11/30/2017, 12/11/2017, 01/14/2021   Influenza,inj,quad, With Preservative 12/11/2017   Influenza-Unspecified 01/20/2016, 12/13/2019   Moderna Sars-Covid-2 Vaccination 05/29/2019, 06/26/2019, 01/14/2020   Tdap 12/13/2018   Zoster Recombinant(Shingrix) 12/13/2018    Health Maintenance  Topic Date Due   Pneumonia Vaccine 68+ Years old (1 of 2 - PCV) Never done   Cervical Cancer Screening (HPV/Pap Cotest)  Never done   Fecal DNA (Cologuard)  Never done   Zoster Vaccines- Shingrix (2 of 2) 02/07/2019   DEXA SCAN  Never done   COVID-19 Vaccine (4 - 2024-25 season) 08/16/2023 (Originally 11/12/2022)   HIV Screening  07/30/2024 (Originally 01/22/1971)   INFLUENZA VACCINE  10/12/2023   MAMMOGRAM  06/29/2024   Medicare Annual Wellness (AWV)  07/30/2024   DTaP/Tdap/Td (2 - Td or Tdap) 12/12/2028   Hepatitis C Screening  Completed   HPV VACCINES  Aged Out   Meningococcal B Vaccine  Aged Out     Discussed health benefits of physical activity, and encouraged her to engage in regular exercise appropriate for her age and condition.    Problem List Items Addressed This Visit   None Visit Diagnoses       Medicare annual wellness visit, initial    -  Primary       Return in about 29 days (around 08/29/2023) for 6/18 annual physical.     Domingo Friend, DO

## 2023-08-21 ENCOUNTER — Other Ambulatory Visit: Payer: Self-pay | Admitting: Family Medicine

## 2023-08-21 DIAGNOSIS — Z0000 Encounter for general adult medical examination without abnormal findings: Secondary | ICD-10-CM

## 2023-08-21 DIAGNOSIS — R7309 Other abnormal glucose: Secondary | ICD-10-CM

## 2023-08-21 DIAGNOSIS — N393 Stress incontinence (female) (male): Secondary | ICD-10-CM

## 2023-08-21 DIAGNOSIS — M150 Primary generalized (osteo)arthritis: Secondary | ICD-10-CM

## 2023-08-21 DIAGNOSIS — Z853 Personal history of malignant neoplasm of breast: Secondary | ICD-10-CM

## 2023-08-21 DIAGNOSIS — E785 Hyperlipidemia, unspecified: Secondary | ICD-10-CM

## 2023-08-22 ENCOUNTER — Other Ambulatory Visit

## 2023-08-23 LAB — COMPREHENSIVE METABOLIC PANEL WITH GFR
AG Ratio: 1.5 (calc) (ref 1.0–2.5)
ALT: 16 U/L (ref 6–29)
AST: 15 U/L (ref 10–35)
Albumin: 4.2 g/dL (ref 3.6–5.1)
Alkaline phosphatase (APISO): 71 U/L (ref 37–153)
BUN: 20 mg/dL (ref 7–25)
CO2: 27 mmol/L (ref 20–32)
Calcium: 9.5 mg/dL (ref 8.6–10.4)
Chloride: 104 mmol/L (ref 98–110)
Creat: 0.79 mg/dL (ref 0.50–1.05)
Globulin: 2.8 g/dL (ref 1.9–3.7)
Glucose, Bld: 90 mg/dL (ref 65–99)
Potassium: 4.3 mmol/L (ref 3.5–5.3)
Sodium: 140 mmol/L (ref 135–146)
Total Bilirubin: 0.7 mg/dL (ref 0.2–1.2)
Total Protein: 7 g/dL (ref 6.1–8.1)
eGFR: 83 mL/min/{1.73_m2} (ref >=60)

## 2023-08-23 LAB — LIPID PANEL
Cholesterol: 221 mg/dL — ABNORMAL HIGH (ref <200)
HDL: 64 mg/dL (ref >=50)
LDL Cholesterol (Calc): 139 mg/dL — ABNORMAL HIGH
Non-HDL Cholesterol (Calc): 157 mg/dL — ABNORMAL HIGH (ref <130)
Total CHOL/HDL Ratio: 3.5 (calc) (ref <5.0)
Triglycerides: 82 mg/dL (ref <150)

## 2023-08-23 LAB — HEMOGLOBIN A1C
Hgb A1c MFr Bld: 5.2 % (ref <5.7)
Mean Plasma Glucose: 103 mg/dL
eAG (mmol/L): 5.7 mmol/L

## 2023-08-23 LAB — CBC WITH DIFFERENTIAL/PLATELET
Absolute Lymphocytes: 1557 {cells}/uL (ref 850–3900)
Absolute Monocytes: 521 {cells}/uL (ref 200–950)
Basophils Absolute: 50 {cells}/uL (ref 0–200)
Basophils Relative: 0.9 %
Eosinophils Absolute: 241 {cells}/uL (ref 15–500)
Eosinophils Relative: 4.3 %
HCT: 42.7 % (ref 35.0–45.0)
Hemoglobin: 12.2 g/dL (ref 11.7–15.5)
MCH: 19.7 pg — ABNORMAL LOW (ref 27.0–33.0)
MCHC: 28.6 g/dL — ABNORMAL LOW (ref 32.0–36.0)
MCV: 68.9 fL — ABNORMAL LOW (ref 80.0–100.0)
MPV: 10.2 fL (ref 7.5–12.5)
Monocytes Relative: 9.3 %
Neutro Abs: 3231 {cells}/uL (ref 1500–7800)
Neutrophils Relative %: 57.7 %
Platelets: 322 10*3/uL (ref 140–400)
RBC: 6.2 10*6/uL — ABNORMAL HIGH (ref 3.80–5.10)
RDW: 14.8 % (ref 11.0–15.0)
Total Lymphocyte: 27.8 %
WBC: 5.6 10*3/uL (ref 3.8–10.8)

## 2023-08-23 LAB — URINALYSIS, ROUTINE W REFLEX MICROSCOPIC
Bacteria, UA: NONE SEEN /HPF
Bilirubin Urine: NEGATIVE
Glucose, UA: NEGATIVE
Hgb urine dipstick: NEGATIVE
Hyaline Cast: NONE SEEN /LPF
Nitrite: NEGATIVE
Protein, ur: NEGATIVE
RBC / HPF: NONE SEEN /HPF (ref 0–2)
Specific Gravity, Urine: 1.023 (ref 1.001–1.035)
WBC, UA: NONE SEEN /HPF (ref 0–5)
pH: <=5 — AB (ref 5.0–8.0)

## 2023-08-23 LAB — MICROSCOPIC MESSAGE

## 2023-08-23 LAB — TSH: TSH: 1 m[IU]/L (ref 0.40–4.50)

## 2023-08-29 ENCOUNTER — Encounter: Payer: Self-pay | Admitting: Family Medicine

## 2023-08-29 ENCOUNTER — Ambulatory Visit (INDEPENDENT_AMBULATORY_CARE_PROVIDER_SITE_OTHER): Admitting: Family Medicine

## 2023-08-29 VITALS — BP 118/70 | HR 90 | Ht 63.0 in | Wt 170.0 lb

## 2023-08-29 DIAGNOSIS — E7800 Pure hypercholesterolemia, unspecified: Secondary | ICD-10-CM | POA: Diagnosis not present

## 2023-08-29 DIAGNOSIS — M150 Primary generalized (osteo)arthritis: Secondary | ICD-10-CM

## 2023-08-29 DIAGNOSIS — K449 Diaphragmatic hernia without obstruction or gangrene: Secondary | ICD-10-CM

## 2023-08-29 DIAGNOSIS — E66811 Obesity, class 1: Secondary | ICD-10-CM

## 2023-08-29 DIAGNOSIS — Z1211 Encounter for screening for malignant neoplasm of colon: Secondary | ICD-10-CM | POA: Diagnosis not present

## 2023-08-29 DIAGNOSIS — Z0000 Encounter for general adult medical examination without abnormal findings: Secondary | ICD-10-CM | POA: Diagnosis not present

## 2023-08-29 DIAGNOSIS — Z23 Encounter for immunization: Secondary | ICD-10-CM | POA: Diagnosis not present

## 2023-08-29 DIAGNOSIS — K219 Gastro-esophageal reflux disease without esophagitis: Secondary | ICD-10-CM

## 2023-08-29 DIAGNOSIS — Z8719 Personal history of other diseases of the digestive system: Secondary | ICD-10-CM

## 2023-08-29 MED ORDER — CONTRAVE 8-90 MG PO TB12: ORAL_TABLET | ORAL | Status: DC

## 2023-08-29 NOTE — Patient Instructions (Addendum)
 Thank you for coming to the office today.  Prevnar-20 vaccine today  After CT - we can talk about GI Referral - likely [facility] location.  Try 1 week sample Contrave - Week 1: 1 pill daily with meal - Week 2: 1 pill twice a day with meal - Week 3: 2 pill with meal in AM and 1 pill with PM meal - Week 4: 2 pill twice a day with meal   Please notify me after 1 week, and if doing well on Contrave, I can order to [facility] $99 per month plan with this med.  ---------------------------------  You have been referred for a Coronary Calcium Score Cardiac CT Scan. This is a screening test for patients aged 66-66+ with cardiovascular risk factors or who are healthy but would be interested in Cardiovascular Screening for heart disease. Even if there is a family history of heart disease, this imaging can be useful. Typically it can be done every 5+ years or at a different timeline we agree on  The scan will look at the chest and mainly focus on the heart and identify early signs of calcium build up or blockages within the heart arteries. It is not 100% accurate for identifying blockages or heart disease, but it is useful to help us  predict who may have some early changes or be at risk in the future for a heart attack or cardiovascular problem.  The results are reviewed by a Cardiologist and they will document the results. It should become available on MyChart. Typically the results are divided into percentiles based on other patients of the same demographic and age. So it will compare your risk to others similar to you. If you have a higher score >99 or higher percentile >75%tile, it is recommended to consider Statin cholesterol therapy and or referral to Cardiologist. I will try to help explain your results and if we have questions we can contact the Cardiologist.  You will be contacted for scheduling. Usually it is done at any imaging facility through [facility], [facility] or  [facility].  The cost is $99 flat fee total and it does not go through insurance, so no authorization is required.    Colon Cancer Screening: Ordered the Cologuard (home kit) test for colon cancer screening. Stay tuned for further updates.  It will be shipped to you directly. If not received in 2-4 weeks, call us  or the company.   If you send it back and no results are received in 2-4 weeks, call us  or the company as well!   Colon Cancer Screening: - For all adults age 66+ routine colon cancer screening is highly recommended.     - Recent guidelines from American Cancer Society recommend starting age of 12 - Early detection of colon cancer is important, because often there are no warning signs or symptoms, also if found early usually it can be cured. Late stage is hard to treat.   - If Cologuard is NEGATIVE, then it is good for 3 years before next due - If Cologuard is POSITIVE, then it is strongly advised to get a Colonoscopy, which allows the GI doctor to locate the source of the cancer or polyp (even very early stage) and treat it by removing it. ------------------------- Follow instructions to collect sample, you may call the company for any help or questions, 24/7 telephone support at [phone].    Please schedule a Follow-up Appointment to: Return in about 6 months (around 02/28/2024), or  if symptoms worsen or fail to improve, for 6 month follow-up PRN.  If you have any other questions or concerns, please feel free to call the office or send a message through MyChart. You may also schedule an earlier appointment if necessary.  Additionally, you may be receiving a survey about your experience at our office within a few days to 1 week by e-mail or mail. We value your feedback.  Domingo Friend, DO [facility], [place]

## 2023-08-29 NOTE — Progress Notes (Signed)
 Subjective:    Patient ID: Wendy Caldwell, female    DOB: 1956-01-20, 68 y.o.   MRN: [id number]  Wendy Caldwell is a 68 y.o. female presenting on 08/29/2023 for Annual Exam   HPI  Discussed the use of AI scribe software for clinical note transcription with the patient, who gave verbal consent to proceed.  History of Present Illness   Wendy Caldwell is a 68 year old female who presents for a routine follow-up and vaccination update.     Dysphagia / Esophageal Stricture She has a history of acid reflux and esophageal stricture, diagnosed in 2018 with Endoscopy EGD per [facility], causing difficulty swallowing, especially with dry foods and large pills, sometimes leading to pain under the breastbone. No recent endoscopy since her breast cancer diagnosis in 2022, which interrupted her scheduled procedures.  Obesity BMI >30 Abnormal Weight Gain Post chemotherapy Failed Saxenda in past due to nausea vomiting  Did well on Zepbound . She has gained 17 pounds recently, attributed to stopping Zepbound , a medication she was taking for weight management. No longer covered Interested in other options Continues lifestyle modifications  HYPERLIPIDEMIA: She has a history of high cholesterol, with LDL levels ranging from 108 to 139 over the past few years. She takes omega-3 supplements and other over-the-counter medications. Her recent lab work showed a cholesterol level of 139.  History of Left Breast Cancer S/p lumpectomy, chemotherapy, radiation   Background history   March 2022 married after 20 years previously single, then April 2022 diagnosed with breast cancer Summer 2022 with surgery and treatment, s/p lumpectomy + chemotherapy + radiation August 2023 diagnosed Cancer Free from breast cancer   Several complications following chemo / radiation   Upcoming Mammogram through Dr Dortha Gauss Upcoming surveillance w/ Dr Randy Buttery   She has had severe dry - she had various  glasses rx and severe symptoms, she said tear film would evaporate instantly. [employer], Jenna Roney Optimetry - Prokera, stem cell disc put in the eye, and keep eye closed for period of time, has had both eyes treated now. She is now in mild to moderate category now, has another treatment upcoming. Vision has improved. Miebo eye drops with notable relief.   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   Osteoarthritis, multiple joints Admits joint pain knee arthritis Left thumb CMC Right elbow She did rodeo 40+ years ago had prior joint injuries She experiences arthritis-related symptoms, including shoulder and hand pain, but these are not severe enough to require immediate intervention.    Psoriasis, plaque psoriasis She was on Humira for 18 years, she had stopped this prior to chemotherapy.   GERD On Omeprazole  40mg  AS NEEDED   Health Maintenance: Update at next visit.  She prefers Cologuard testing over a colonoscopy. Her last colonoscopy was performed years ago when she lived in [place], prior to 2021. She has not had a Pap smear in over 20 years and is not interested in pursuing one unless indicated by other tests. She continues to undergo mammograms but plans to pause them after five years.  Cologuard ordered today, previous Colonoscopy >5 years ago.,  She has previously received the Shingrix vaccine, with the first dose administered in 2020 or 2021, but is uncertain if she received the second dose. She plans to verify this with her pharmacy records.  08/29/2023    2:02 PM 04/10/2022   12:51 PM  Depression screen PHQ 2/9  Decreased Interest 0 0  Down, Depressed, Hopeless 0 0  PHQ - 2 Score 0 0  Altered sleeping 0   Tired, decreased energy 0   Change in appetite 0   Feeling bad or failure about  yourself  0   Trouble concentrating 0   Moving slowly or fidgety/restless 0   Suicidal thoughts 0   PHQ-9 Score 0   Difficult doing work/chores Not difficult at all        08/29/2023    2:02 PM  GAD 7 : Generalized Anxiety Score  Nervous, Anxious, on Edge 0  Control/stop worrying 0  Worry too much - different things 0  Trouble relaxing 0  Restless 0  Easily annoyed or irritable 0  Afraid - awful might happen 0  Total GAD 7 Score 0  Anxiety Difficulty Not difficult at all     Past Medical History:  Diagnosis Date   Breast Cancer    Family history of adverse reaction to anesthesia    adopted   GERD (gastroesophageal reflux disease)    Past Surgical History:  Procedure Laterality Date   BREAST BIOPSY Left 07/05/2020   US  Bx, Q clip, [facility]   BREAST BIOPSY Left 07/05/2020   US  Bx, [facility], benign breast tissue with increased stromal fibrosis   BREAST LUMPECTOMY Left 10/26/2020   CARPAL TUNNEL RELEASE Bilateral    CHOLECYSTECTOMY     GANGLION CYST EXCISION     wrist   PART MASTECTOMY,RADIO FREQUENCY LOCALIZER,AXILLARY SENTINEL NODE BIOPSY Left 10/26/2020   Procedure: PART MASTECTOMY,RADIO FREQUENCY LOCALIZER,AXILLARY SENTINEL NODE BIOPSY;  Surgeon: Eldred Grego, MD;  Location: [facility] ORS;  Service: General;  Laterality: Left;   PORTACATH PLACEMENT Right 07/21/2020   Procedure: INSERTION PORT-A-CATH;  Surgeon: Eldred Grego, MD;  Location: [facility] ORS;  Service: General;  Laterality: Right;   right ovary removal     Social History   Socioeconomic History   Marital status: Married    Spouse name: Not on file   Number of children: Not on file   Years of education: Not on file   Highest education level: Master's degree (e.g., MA, MS, MEng, MEd, MSW, MBA)  Occupational History   Not on file  Tobacco Use   Smoking status: Former    Types: Cigarettes    Start date: 2012   Smokeless tobacco: Never  Vaping Use   Vaping status: Never Used  Substance and Sexual  Activity   Alcohol use: Yes    Comment: occassional    Drug use: Never   Sexual activity: Yes  Other Topics Concern   Not on file  Social History Narrative   Not on file   Social Drivers of Health   Financial Resource Strain: Low Risk  (07/30/2023)   Overall Financial Resource Strain (CARDIA)    Difficulty of Paying Living Expenses: Not hard at all  Food Insecurity: No Food Insecurity (07/30/2023)   Hunger Vital Sign    Worried About Running Out of Food in the Last Year: Never true    Ran Out of Food in the Last Year: Never true  Transportation Needs: No Transportation Needs (07/30/2023)   PRAPARE - Administrator, Civil Service (Medical): No    Lack of Transportation (Non-Medical): No  Physical Activity: Sufficiently Active (07/30/2023)   Exercise Vital Sign    Days of Exercise per Week: 7 days    Minutes of  Exercise per Session: 70 min  Stress: No Stress Concern Present (07/30/2023)   [employer] of Occupational Health - Occupational Stress Questionnaire    Feeling of Stress : Not at all  Social Connections: Socially Integrated (07/30/2023)   Social Connection and Isolation Panel    Frequency of Communication with Friends and Family: More than three times a week    Frequency of Social Gatherings with Friends and Family: More than three times a week    Attends Religious Services: More than 4 times per year    Active Member of [employer] or Organizations: Yes    Attends Engineer, structural: More than 4 times per year    Marital Status: Married  Catering manager Violence: Not on file   Family History  Adopted: Yes  Problem Relation Age of Onset   Breast cancer Neg Hx    Current Outpatient Medications on File Prior to Visit  Medication Sig   b complex vitamins capsule Take 1 capsule by mouth daily.   co-enzyme Q-10 30 MG capsule Take 30 mg by mouth daily.   fish oil-omega-3 fatty acids 1000 MG capsule Take 2 g by mouth daily.   MIEBO 1.338 GM/ML SOLN  Apply to eye.   Multiple Vitamins-Minerals (EYE-VITE PLUS LUTEIN PO) Take 1 capsule by mouth 2 (two) times daily. AREDS2   Multiple Vitamins-Minerals (MULTIVITAMIN WITH MINERALS) tablet Take 1 tablet by mouth daily.   omeprazole  (PRILOSEC) 40 MG capsule TAKE 1 CAPSULE BY MOUTH EVERY DAY BEFORE BREAKFAST   vitamin B-12 (CYANOCOBALAMIN) 1000 MCG tablet Take 1,000 mcg by mouth daily.   vitamin C (ASCORBIC ACID) 500 MG tablet Take 500 mg by mouth daily.   vitamin E 180 MG (400 UNITS) capsule Take 400 Units by mouth daily.   [DISCONTINUED] prochlorperazine  (COMPAZINE ) 10 MG tablet Take 1 tablet (10 mg total) by mouth every 6 (six) hours as needed (Nausea or vomiting). (Patient not taking: Reported on 10/20/2020)   No current facility-administered medications on file prior to visit.    Review of Systems Per HPI unless specifically indicated above     Objective:    BP 118/70 (BP Location: Right Arm, Patient Position: Sitting, Cuff Size: Normal)   Pulse 90   Ht 5' 3 (1.6 m)   Wt 170 lb (77.1 kg)   SpO2 98%   BMI 30.11 kg/m   Wt Readings from Last 3 Encounters:  08/29/23 170 lb (77.1 kg)  07/31/23 168 lb 4 oz (76.3 kg)  07/23/23 167 lb 9.6 oz (76 kg)    Physical Exam Vitals and nursing note reviewed.  Constitutional:      General: She is not in acute distress.    Appearance: She is well-developed. She is not diaphoretic.     Comments: Well-appearing, comfortable, cooperative  HENT:     Head: Normocephalic and atraumatic.   Eyes:     General:        Right eye: No discharge.        Left eye: No discharge.     Conjunctiva/sclera: Conjunctivae normal.     Pupils: Pupils are equal, round, and reactive to light.   Neck:     Thyroid : No thyromegaly.     Vascular: No carotid bruit.   Cardiovascular:     Rate and Rhythm: Normal rate and regular rhythm.     Pulses: Normal pulses.     Heart sounds: Normal heart sounds. No murmur heard. Pulmonary:     Effort: Pulmonary effort is  normal.  No respiratory distress.     Breath sounds: Normal breath sounds. No wheezing or rales.  Abdominal:     General: Bowel sounds are normal. There is no distension.     Palpations: Abdomen is soft. There is no mass.     Tenderness: There is no abdominal tenderness.   Musculoskeletal:        General: No tenderness. Normal range of motion.     Cervical back: Normal range of motion and neck supple.     Right lower leg: No edema.     Left lower leg: No edema.     Comments: Upper / Lower Extremities: - Normal muscle tone, strength bilateral upper extremities 5/5, lower extremities 5/5  Lymphadenopathy:     Cervical: No cervical adenopathy.   Skin:    General: Skin is warm and dry.     Findings: No erythema or rash.   Neurological:     Mental Status: She is alert and oriented to person, place, and time.     Comments: Distal sensation intact to light touch all extremities  Psychiatric:        Mood and Affect: Mood normal.        Behavior: Behavior normal.        Thought Content: Thought content normal.     Comments: Well groomed, good eye contact, normal speech and thoughts     Results for orders placed or performed in visit on 08/21/23  MICROSCOPIC MESSAGE   Collection Time: 08/22/23  8:25 AM  Result Value Ref Range   Note    TSH   Collection Time: 08/22/23  8:25 AM  Result Value Ref Range   TSH 1.00 0.40 - 4.50 mIU/L  Lipid panel   Collection Time: 08/22/23  8:25 AM  Result Value Ref Range   Cholesterol 221 (H) <200 mg/dL   HDL 64 > OR = 50 mg/dL   Triglycerides 82 <161 mg/dL   LDL Cholesterol (Calc) 139 (H) mg/dL (calc)   Total CHOL/HDL Ratio 3.5 <5.0 (calc)   Non-HDL Cholesterol (Calc) 157 (H) <130 mg/dL (calc)  Hemoglobin W9U   Collection Time: 08/22/23  8:25 AM  Result Value Ref Range   Hgb A1c MFr Bld 5.2 <5.7 %   Mean Plasma Glucose 103 mg/dL   eAG (mmol/L) 5.7 mmol/L  CBC with Differential/Platelet   Collection Time: 08/22/23  8:25 AM  Result Value Ref  Range   WBC 5.6 3.8 - 10.8 Thousand/uL   RBC 6.20 (H) 3.80 - 5.10 Million/uL   Hemoglobin 12.2 11.7 - 15.5 g/dL   HCT 04.5 40.9 - 81.1 %   MCV 68.9 (L) 80.0 - 100.0 fL   MCH 19.7 (L) 27.0 - 33.0 pg   MCHC 28.6 (L) 32.0 - 36.0 g/dL   RDW 91.4 78.2 - 95.6 %   Platelets 322 140 - 400 Thousand/uL   MPV 10.2 7.5 - 12.5 fL   Neutro Abs 3,231 1,500 - 7,800 cells/uL   Absolute Lymphocytes 1,557 850 - 3,900 cells/uL   Absolute Monocytes 521 200 - 950 cells/uL   Eosinophils Absolute 241 15 - 500 cells/uL   Basophils Absolute 50 0 - 200 cells/uL   Neutrophils Relative % 57.7 %   Total Lymphocyte 27.8 %   Monocytes Relative 9.3 %   Eosinophils Relative 4.3 %   Basophils Relative 0.9 %  Comprehensive metabolic panel with GFR   Collection Time: 08/22/23  8:25 AM  Result Value Ref Range   Glucose, Bld 90 65 -  99 mg/dL   BUN 20 7 - 25 mg/dL   Creat 6.29 5.28 - 4.13 mg/dL   eGFR 83 > OR = 60 KG/MWN/0.27O5   BUN/Creatinine Ratio SEE NOTE: 6 - 22 (calc)   Sodium 140 135 - 146 mmol/L   Potassium 4.3 3.5 - 5.3 mmol/L   Chloride 104 98 - 110 mmol/L   CO2 27 20 - 32 mmol/L   Calcium 9.5 8.6 - 10.4 mg/dL   Total Protein 7.0 6.1 - 8.1 g/dL   Albumin 4.2 3.6 - 5.1 g/dL   Globulin 2.8 1.9 - 3.7 g/dL (calc)   AG Ratio 1.5 1.0 - 2.5 (calc)   Total Bilirubin 0.7 0.2 - 1.2 mg/dL   Alkaline phosphatase (APISO) 71 37 - 153 U/L   AST 15 10 - 35 U/L   ALT 16 6 - 29 U/L  Urinalysis, Routine w reflex microscopic   Collection Time: 08/22/23  8:25 AM  Result Value Ref Range   Color, Urine DARK YELLOW YELLOW   APPearance CLEAR CLEAR   Specific Gravity, Urine 1.023 1.001 - 1.035   pH < OR = 5.0 (A) 5.0 - 8.0   Glucose, UA NEGATIVE NEGATIVE   Bilirubin Urine NEGATIVE NEGATIVE   Ketones, ur TRACE (A) NEGATIVE   Hgb urine dipstick NEGATIVE NEGATIVE   Protein, ur NEGATIVE NEGATIVE   Nitrite NEGATIVE NEGATIVE   Leukocytes,Ua 1+ (A) NEGATIVE   WBC, UA NONE SEEN 0 - 5 /HPF   RBC / HPF NONE SEEN 0 - 2 /HPF    Squamous Epithelial / HPF 0-5 < OR = 5 /HPF   Bacteria, UA NONE SEEN NONE SEEN /HPF   Hyaline Cast NONE SEEN NONE SEEN /LPF      Assessment & Plan:   Problem List Items Addressed This Visit     GERD (gastroesophageal reflux disease)   History of esophageal stricture   Obesity (BMI 30.0-34.9)   Relevant Medications   CONTRAVE 8-90 MG TB12   Primary osteoarthritis involving multiple joints   Other Visit Diagnoses       Annual physical exam    -  Primary     Need for Streptococcus pneumoniae vaccination       Relevant Orders   Pneumococcal conjugate vaccine 20-valent (Completed)     Screening for colon cancer       Relevant Orders   Cologuard     Pure hypercholesterolemia       Relevant Orders   CT CARDIAC SCORING (SELF PAY ONLY)     Hiatal hernia            Updated Health Maintenance information Reviewed recent lab results with patient Encouraged improvement to lifestyle with diet and exercise Goal of weight loss  Dysphagia / Possible Esophageal Stricture (history of) vs Hiatal Hernia Difficulty swallowing, especially with dry foods and large pills, causing substernal pain. Possible esophageal stricture, GERD, and hiatal hernia - previously diagnosed on EGD 2018. Further evaluation needed. - Order CT coronary artery scan - for cardiovascular screening but this may show hiatal hernia or other abnormality as well. - Consider referral to gastroenterology for endoscopy post-CT scan results. Advised her that I do recommend GI regardless, but prefer to wait for imaging results first then place referral. She prefers [facility] location. Prior Colonoscopy was not in [facility]  Obesity BMI >30 / Weight Gain 17-pound weight gain after discontinuing Zepbound . Previous Wegovy use intolerable. BMI 30.1, qualifying for weight loss medication. Discussed Contrave, cost $99/month,  not covered by Medicare. - Provide 7-day sample of Contrave. - Discuss starting Contrave  after upcoming event. She can contact us  for order request if interested in med. - Monitor weight and response to Contrave.  Psoriasis In remission after discontinuing Humira during chemotherapy.  Osteoarthritis Shoulder and hand pain, likely arthritis. No intervention needed currently. - Monitor symptoms and provide advice as needed.  General Health Maintenance Due for pneumonia vaccine. Prefers Cologuard for colorectal cancer screening. Declines Pap smear, continues mammograms. Lab results normal except mildly elevated cholesterol. CT coronary artery scan recommended for cardiovascular risk assessment. - Administer Prevnar vaccine. - Order Cologuard test. - Order CT coronary artery scan. - Monitor cholesterol levels. - Continue mammograms until five-year mark.  Follow-up Plans to monitor ongoing health issues and response to new treatments. - Follow up within six months. - Communicate via MyChart for updates and further instructions.         Orders Placed This Encounter  Procedures   CT CARDIAC SCORING (SELF PAY ONLY)    Standing Status:   Future    Expiration Date:   08/28/2024    Preferred imaging location?:   [facility]   Pneumococcal conjugate vaccine 20-valent   Cologuard    Meds ordered this encounter  Medications   CONTRAVE 8-90 MG TB12    Sig: Week 1: Take 1 tab daily with breakfast. Week 2: Take 1 tab twice daily with meal. Week 3: Take 2 tab with AM meal and 1 tab with PM meal. Week 4+: Take 2 tabs twice daily with meals - continue for weight loss     Follow up plan: Return in about 6 months (around 02/28/2024), or if symptoms worsen or fail to improve, for 6 month follow-up PRN.  Domingo Friend, DO [facility] 08/29/2023, 2:10 PM

## 2023-09-05 ENCOUNTER — Encounter

## 2023-09-05 ENCOUNTER — Ambulatory Visit
Admission: RE | Admit: 2023-09-05 | Discharge: 2023-09-05 | Disposition: A | Discharge status: Home / Self Care | Payer: Self-pay | Source: Ambulatory Visit | Attending: Family Medicine | Admitting: Family Medicine

## 2023-09-05 DIAGNOSIS — E7800 Pure hypercholesterolemia, unspecified: Secondary | ICD-10-CM | POA: Insufficient documentation

## 2023-09-06 ENCOUNTER — Ambulatory Visit: Payer: Self-pay | Admitting: Family Medicine

## 2023-09-06 LAB — COLOGUARD

## 2023-09-19 ENCOUNTER — Ambulatory Visit
Admission: RE | Admit: 2023-09-19 | Discharge: 2023-09-19 | Disposition: A | Discharge status: Home / Self Care | Source: Ambulatory Visit | Attending: Family Medicine | Admitting: Family Medicine

## 2023-09-19 ENCOUNTER — Encounter: Payer: Self-pay | Admitting: Family Medicine

## 2023-09-19 ENCOUNTER — Ambulatory Visit: Payer: Self-pay | Admitting: Family Medicine

## 2023-09-19 ENCOUNTER — Ambulatory Visit
Admission: RE | Admit: 2023-09-19 | Discharge: 2023-09-19 | Disposition: A | Discharge status: Home / Self Care | Source: Ambulatory Visit | Attending: Oncology | Admitting: Oncology

## 2023-09-19 DIAGNOSIS — E66811 Obesity, class 1: Secondary | ICD-10-CM

## 2023-09-19 DIAGNOSIS — Z853 Personal history of malignant neoplasm of breast: Secondary | ICD-10-CM

## 2023-09-19 DIAGNOSIS — Z780 Asymptomatic menopausal state: Secondary | ICD-10-CM | POA: Insufficient documentation

## 2023-09-19 MED ORDER — CONTRAVE 8-90 MG PO TB12: ORAL_TABLET | ORAL | 0 refills | Status: DC

## 2023-09-26 LAB — COLOGUARD: COLOGUARD: NEGATIVE

## 2023-10-01 ENCOUNTER — Other Ambulatory Visit: Payer: Self-pay | Admitting: Family Medicine

## 2023-10-01 DIAGNOSIS — E66811 Obesity, class 1: Secondary | ICD-10-CM

## 2023-10-03 MED ORDER — CONTRAVE 8-90 MG PO TB12: ORAL_TABLET | ORAL | 0 refills | Status: DC

## 2023-10-03 NOTE — Telephone Encounter (Signed)
 Requested medication (s) are due for refill today -yes  Requested medication (s) are on the active medication list -yes  Future visit scheduled -yes  Last refill: -09/19/23 #120  Notes to clinic: Request for continuation Rx- sent for new/updated Rx  Requested Prescriptions  Pending Prescriptions Disp Refills   CONTRAVE  8-90 MG TB12 [Pharmacy Med Name: CONTRAVE  8-90mg  TAB ER] 120 tablet 0    Sig: TAKE 1 TABLET BY MOUTH ONCE DAILY IN THE MORNING FOR 1 WEEK, THEN 1 TABLET TWICE DAILY FOR 1 WEEK, THEN TWO TABS IN THE A.M. AND 1 TABLET IN THE P.M. FOR 1 WEEK, THEN TWO TABS TWICE DAILY THEREAFTER.     Off-Protocol Failed - 10/03/2023  3:48 PM      Failed - Medication not assigned to a protocol, review manually.      Passed - Valid encounter within last 12 months    Recent Outpatient Visits           1 month ago Annual physical exam   [facility], Marsa PARAS, DO   2 months ago Medicare annual wellness visit, initial   [facility] [place], Marsa PARAS, DO                 Requested Prescriptions  Pending Prescriptions Disp Refills   CONTRAVE  8-90 MG TB12 [Pharmacy Med Name: CONTRAVE  8-90mg  TAB ER] 120 tablet 0    Sig: TAKE 1 TABLET BY MOUTH ONCE DAILY IN THE MORNING FOR 1 WEEK, THEN 1 TABLET TWICE DAILY FOR 1 WEEK, THEN TWO TABS IN THE A.M. AND 1 TABLET IN THE P.M. FOR 1 WEEK, THEN TWO TABS TWICE DAILY THEREAFTER.     Off-Protocol Failed - 10/03/2023  3:48 PM      Failed - Medication not assigned to a protocol, review manually.      Passed - Valid encounter within last 12 months    Recent Outpatient Visits           1 month ago Annual physical exam   [place] [facility] [facility] [place], Marsa PARAS, DO   2 months ago Medicare annual wellness visit, initial   [facility], Marsa PARAS, [place]

## 2023-10-22 ENCOUNTER — Ambulatory Visit (INDEPENDENT_AMBULATORY_CARE_PROVIDER_SITE_OTHER): Admitting: Family Medicine

## 2023-10-22 ENCOUNTER — Encounter: Payer: Self-pay | Admitting: Family Medicine

## 2023-10-22 VITALS — BP 124/78 | HR 73 | Ht 63.0 in | Wt 167.1 lb

## 2023-10-22 DIAGNOSIS — R232 Flushing: Secondary | ICD-10-CM | POA: Diagnosis not present

## 2023-10-22 DIAGNOSIS — M810 Age-related osteoporosis without current pathological fracture: Secondary | ICD-10-CM | POA: Insufficient documentation

## 2023-10-22 DIAGNOSIS — M816 Localized osteoporosis [Lequesne]: Secondary | ICD-10-CM | POA: Diagnosis not present

## 2023-10-22 NOTE — Patient Instructions (Addendum)
 Thank you for coming to the office today.  Referral to [facility] to discuss the osteoporosis treatment further options. Most likely we will be pursuing Prolia / Infusion therapy options or similar.  Endocrinology [facility] [address] [place], [place]  [address] Phone: [phone]  Karry Plants supplements Black Cohosh Not hormonal supplement  Please schedule a Follow-up Appointment to: Return if symptoms worsen or fail to improve.  If you have any other questions or concerns, please feel free to call the office or send a message through MyChart. You may also schedule an earlier appointment if necessary.  Additionally, you may be receiving a survey about your experience at our office within a few days to 1 week by e-mail or mail. We value your feedback.  Marsa Officer, DO [facility], [place]

## 2023-10-22 NOTE — Progress Notes (Signed)
 Subjective:    Patient ID: Wendy Caldwell, female    DOB: 11-10-1955, 68 y.o.   MRN: [id number]  Wendy Caldwell is a 68 y.o. female presenting on 10/22/2023 for Medical Management of Chronic Issues   HPI  Discussed the use of AI scribe software for clinical note transcription with the patient, who gave verbal consent to proceed.  History of Present Illness    Wendy Caldwell is a 68 year old female with osteoporosis who presents for a discussion on osteoporosis management. She is accompanied by her husband.  Osteoporosis - Diagnosed with osteoporosis - Currently taking calcium and vitamin D3 supplements - Expresses concern regarding the effectiveness and time frame of osteoporosis medications, with expected improvement over 2-3 years - no prior medications before. Prefers to not take oral bisphosphonate if not as effective - Interested in the potential for reversing osteoporosis and the implications of different treatment options - T-scores: spine -3.3, left hip -2.4, right hip -2.5 - Considering advanced treatment options, such as infusions every 6-12 months - Concerned about potential side effects and long-term commitment of infusion therapies  Functional status and fall risk - Maintains an active lifestyle, does not lead a sedentary lifestyle - Occasional clumsiness - Concerned about maintaining activity, including traveling and walking on cobblestone streets, without fear of fractures  Vasomotor symptoms - Experiences hot flashes with slight exertion or sometimes without any trigger - Hot flashes do not resemble previous menopausal symptoms - Did not take hormone replacement therapy post-chemotherapy     She has started - Calcium up to 1000mg  daily, Vitamin D3 2,000 unit daily.   Recommend strengthening exercises with weight bearing to help improve bone strength.        08/29/2023    2:02 PM 04/10/2022   12:51 PM  Depression screen PHQ 2/9   Decreased Interest 0 0  Down, Depressed, Hopeless 0 0  PHQ - 2 Score 0 0  Altered sleeping 0   Tired, decreased energy 0   Change in appetite 0   Feeling bad or failure about yourself  0   Trouble concentrating 0   Moving slowly or fidgety/restless 0   Suicidal thoughts 0   PHQ-9 Score 0   Difficult doing work/chores Not difficult at all        08/29/2023    2:02 PM  GAD 7 : Generalized Anxiety Score  Nervous, Anxious, on Edge 0  Control/stop worrying 0  Worry too much - different things 0  Trouble relaxing 0  Restless 0  Easily annoyed or irritable 0  Afraid - awful might happen 0  Total GAD 7 Score 0  Anxiety Difficulty Not difficult at all    Social History   Tobacco Use   Smoking status: Former    Types: Cigarettes    Start date: 2012   Smokeless tobacco: Never  Vaping Use   Vaping status: Never Used  Substance Use Topics   Alcohol use: Yes    Comment: occassional    Drug use: Never    Review of Systems Per HPI unless specifically indicated above     Objective:    BP 124/78 (BP Location: Right Arm, Patient Position: Sitting, Cuff Size: Normal)   Pulse 73   Ht 5' 3 (1.6 m)   Wt 167 lb 2 oz (75.8 kg)   SpO2 90%   BMI 29.60 kg/m   Wt Readings from Last 3 Encounters:  10/22/23 167 lb 2 oz (75.8 kg)  08/29/23  170 lb (77.1 kg)  07/31/23 168 lb 4 oz (76.3 kg)    Physical Exam Vitals and nursing note reviewed.  Constitutional:      General: She is not in acute distress.    Appearance: Normal appearance. She is well-developed. She is not diaphoretic.     Comments: Well-appearing, comfortable, cooperative  HENT:     Head: Normocephalic and atraumatic.  Eyes:     General:        Right eye: No discharge.        Left eye: No discharge.     Conjunctiva/sclera: Conjunctivae normal.  Cardiovascular:     Rate and Rhythm: Normal rate.  Pulmonary:     Effort: Pulmonary effort is normal.  Skin:    General: Skin is warm and dry.     Findings: No  erythema or rash.  Neurological:     Mental Status: She is alert and oriented to person, place, and time.  Psychiatric:        Mood and Affect: Mood normal.        Behavior: Behavior normal.        Thought Content: Thought content normal.     Comments: Well groomed, good eye contact, normal speech and thoughts       Narrative & Impression  EXAM: DUAL X-RAY ABSORPTIOMETRY (DXA) FOR BONE MINERAL DENSITY 09/19/2023 9:19 am   CLINICAL DATA:  68 year old Female Postmenopausal. Osteoporosis screening, postmenopausal estrogen deficiency initial screen   History of fragility fracture.   TECHNIQUE: An axial (e.g., hips, spine) and/or appendicular (e.g., radius) exam was performed, as appropriate, using GE Secretary/administrator at [facility]. Images are obtained for bone mineral density measurement and are not obtained for diagnostic purposes. [id number]   Exclusions: None.   COMPARISON:  None.   FINDINGS: Scan quality: Good.   LUMBAR SPINE (L1-L4):   BMD (in g/cm2): 0.787   T-score: -3.3   Z-score: -1.7   LEFT FEMORAL NECK:   BMD (in g/cm2): 0.710   T-score: -2.4   Z-score: -0.9   LEFT TOTAL HIP:   BMD (in g/cm2): 0.713   T-score: -2.3   Z-score: -1.1   RIGHT FEMORAL NECK:   BMD (in g/cm2): 0.693   T-score: -2.5   Z-score: -1.0   RIGHT TOTAL HIP:   BMD (in g/cm2): 0.697   T-score: -2.5   Z-score: -1.2   LEFT FOREARM (RADIUS 33%):   BMD (in g/cm2): 0.543   T-score: -3.8   Z-score: -2.4   FRAX 10-YEAR PROBABILITY OF FRACTURE:   FRAX not reported as the lowest BMD is not in the osteopenia range.   IMPRESSION: Osteoporosis based on BMD.   Fracture risk is increased. Increased risk is based on low BMD, and history of fragility fracture.   RECOMMENDATIONS: 1. All patients should optimize calcium and vitamin D intake.   2. Consider FDA-approved medical therapies in postmenopausal women and men aged 22 years and  older, based on the following:   - A hip or vertebral (clinical or morphometric) fracture   - T-score less than or equal to -2.5 and secondary causes have been excluded.   - Low bone mass (T-score between -1.0 and -2.5) and a 10-year probability of a hip fracture greater than or equal to 3% or a 10-year probability of a major osteoporosis-related fracture greater than or equal to 20% based on the [place] -adapted WHO algorithm.   - Clinician judgment and/or patient preferences may indicate treatment for people  with 10-year fracture probabilities above or below these levels   3. Patients with diagnosis of osteoporosis or at high risk for fracture should have regular bone mineral density tests. For patients eligible for Medicare, routine testing is allowed once every 2 years. The testing frequency can be increased to one year for patients who have rapidly progressing disease, those who are receiving or discontinuing medical therapy to restore bone mass, or have additional risk factors.     Electronically Signed   By: Dina  Arceo M.D.   On: 09/19/2023 11:19    Results for orders placed or performed in visit on 08/29/23  Cologuard   Collection Time: 09/04/23  9:04 AM  Result Value Ref Range   COLOGUARD Sample Could Not Be Processed 2 N/A  Cologuard   Collection Time: 09/20/23 11:06 AM  Result Value Ref Range   COLOGUARD Negative Negative      Assessment & Plan:   Problem List Items Addressed This Visit     Hot flashes   Relevant Orders   Ambulatory referral to Endocrinology   Localized osteoporosis without current pathological fracture - Primary   Relevant Orders   Ambulatory referral to Endocrinology     Osteoporosis Severe osteoporosis with significant bone density loss. High fracture risk. Concerns about treatment efficacy and risks. Initial DEXA reviewed see above results. - Continue calcium and vitamin D supplementation. - Refer to endocrinologist at [facility] for further evaluation and management. Consider advanced options including Prolia, instead of oral bisphosphonate - Discuss with Endocrine for available eligible options next  Hot flashes Intermittent hot flashes possibly due to hormonal changes post-chemotherapy. No hormone replacement therapy due to cancer history. Etiology unclear. - Include hot flash symptoms in the referral to the endocrinologist. - Consider over-the-counter herbal supplements like Estroven or black cohosh for symptom management.      Orders Placed This Encounter  Procedures   Ambulatory referral to Endocrinology    Referral Priority:   Routine    Referral Type:   Consultation    Referral Reason:   Specialty Services Required    Number of Visits Requested:   1    No orders of the defined types were placed in this encounter.   Follow up plan: Return if symptoms worsen or fail to improve.  Marsa Officer, DO [facility] 10/22/2023, 11:08 AM

## 2023-11-13 ENCOUNTER — Encounter: Payer: Self-pay | Admitting: Family Medicine

## 2023-11-13 DIAGNOSIS — E66811 Obesity, class 1: Secondary | ICD-10-CM

## 2023-11-13 MED ORDER — CONTRAVE 8-90 MG PO TB12: 2.0000 | ORAL_TABLET | Freq: Two times a day (BID) | ORAL | 2 refills | Status: DC

## 2024-01-23 ENCOUNTER — Ambulatory Visit: Admitting: Oncology

## 2024-01-28 NOTE — Progress Notes (Unsigned)
 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  She self palpated a left breast mass which prompted a bilateral diagnostic mammogram on 06/25/2020.  That showed an irregular hypoechoic mass 2.5 x 1.7 x 1.8 cm at 1 o'clock position 1 cm from the nipple.  3 cm from the nipple were benign cysts.  1 mildly enlarged left axillary lymph node with cortical thickening 4 mm.  Both the breast mass and the lymph node were biopsied.  Left breast biopsy was consistent with invasive mammary carcinoma grade 3 ER weakly +1 to 10%, PR negative and HER2 positive by IHC.   Patient completed 4 cycles of neoadjuvant TCHP chemotherapy which was complicated by worsening skin rash and fatigue.  Patient did not wish to proceed with 2 further cycles of chemotherapy and proceeded with lumpectomy and sentinel lymph node biopsy.  Final pathology showed complete pathological response on 10/26/2020.  2 sentinel lymph nodes negative for malignancy.   Patient completed adjuvant radiation as well as adjuvant Herceptin  and Perjeta  for 1 year.  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  Denies any changes in her appetite or weight.  She does not wish to get screening mammograms at this point  ECOG PS- 0 Pain scale- 0   Review of systems- Review of Systems  Constitutional:  Negative for chills, fever, malaise/fatigue and weight loss.  HENT:  Negative for congestion, ear discharge and nosebleeds.   Eyes:  Negative for blurred vision.  Respiratory:  Negative for cough, hemoptysis, sputum production, shortness of breath and wheezing.   Cardiovascular:  Negative for chest pain, palpitations, orthopnea and claudication.  Gastrointestinal:  Negative for abdominal pain, blood in stool, constipation, diarrhea, heartburn, melena, nausea and vomiting.  Genitourinary:  Negative for dysuria, flank pain, frequency, hematuria and urgency.  Musculoskeletal:  Negative for back pain, joint pain and myalgias.  Skin:  Negative for rash.  Neurological:  Negative for dizziness, tingling, focal weakness, seizures, weakness and headaches.  Endo/Heme/Allergies:  Does not bruise/bleed easily.  Psychiatric/Behavioral:  Negative for depression and suicidal ideas. The patient does not have insomnia.       No Known Allergies   Past Medical History:  Diagnosis Date   Breast Cancer    Family history of adverse reaction to anesthesia    adopted   GERD (gastroesophageal reflux disease)      Past Surgical History:  Procedure Laterality Date   BREAST BIOPSY Left 07/05/2020   US  Bx, Q clip, [facility]   BREAST BIOPSY Left 07/05/2020   US  Bx, [facility], benign breast  tissue with increased stromal fibrosis   BREAST BIOPSY Right    2022 benign   BREAST LUMPECTOMY Left 10/26/2020   CARPAL TUNNEL RELEASE Bilateral    CHOLECYSTECTOMY     GANGLION CYST EXCISION     wrist   PART MASTECTOMY,RADIO FREQUENCY LOCALIZER,AXILLARY  SENTINEL NODE BIOPSY Left 10/26/2020   Procedure: PART MASTECTOMY,RADIO FREQUENCY LOCALIZER,AXILLARY SENTINEL NODE BIOPSY;  Surgeon: Rodolph Romano, MD;  Location: [facility] ORS;  Service: General;  Laterality: Left;   PORTACATH PLACEMENT Right 07/21/2020   Procedure: INSERTION PORT-A-CATH;  Surgeon: Rodolph Romano, MD;  Location: [facility] ORS;  Service: General;  Laterality: Right;   right ovary removal      Social History   Socioeconomic History   Marital status: Married    Spouse name: Not on file   Number of children: Not on file   Years of education: Not on file   Highest education level: Master's degree (e.g., MA, MS, MEng, MEd, MSW, MBA)  Occupational History   Not on file  Tobacco Use   Smoking status: Former    Types: Cigarettes    Start date: 2012   Smokeless tobacco: Never  Vaping Use   Vaping status: Never Used  Substance and Sexual Activity   Alcohol use: Yes    Comment: occassional    Drug use: Never   Sexual activity: Yes  Other Topics Concern   Not on file  Social History Narrative   Not on file   Social Drivers of Health   Financial Resource Strain: Low Risk  (07/30/2023)   Overall Financial Resource Strain (CARDIA)    Difficulty of Paying Living Expenses: Not hard at all  Food Insecurity: No Food Insecurity (07/30/2023)   Hunger Vital Sign    Worried About Running Out of Food in the Last Year: Never true    Ran Out of Food in the Last Year: Never true  Transportation Needs: No Transportation Needs (07/30/2023)   PRAPARE - Administrator, Civil Service (Medical): No    Lack of Transportation (Non-Medical): No  Physical Activity: Sufficiently Active (07/30/2023)   Exercise Vital Sign    Days of Exercise per Week: 7 days    Minutes of Exercise per Session: 70 min  Stress: No Stress Concern Present (07/30/2023)   [employer] of Occupational Health - Occupational Stress Questionnaire    Feeling of Stress : Not at all  Social  Connections: Socially Integrated (07/30/2023)   Social Connection and Isolation Panel    Frequency of Communication with Friends and Family: More than three times a week    Frequency of Social Gatherings with Friends and Family: More than three times a week    Attends Religious Services: More than 4 times per year    Active Member of [employer] or Organizations: Yes    Attends Engineer, Structural: More than 4 times per year    Marital Status: Married  Catering Manager Violence: Not on file    Family History  Adopted: Yes     Current Outpatient Medications:    b complex vitamins capsule, Take 1 capsule by mouth daily., Disp: , Rfl:    co-enzyme Q-10 30 MG capsule, Take 30 mg by mouth daily., Disp: , Rfl:    CONTRAVE  8-90 MG TB12, Take 2 tablets by mouth 2 (two) times daily with a meal., Disp: 120 tablet, Rfl: 2   fish oil-omega-3 fatty acids 1000 MG capsule, Take 2 g by mouth daily., Disp: , Rfl:  MIEBO 1.338 GM/ML SOLN, Apply to eye., Disp: , Rfl:    Multiple Vitamins-Minerals (EYE-VITE PLUS LUTEIN PO), Take 1 capsule by mouth 2 (two) times daily. AREDS2, Disp: , Rfl:    Multiple Vitamins-Minerals (MULTIVITAMIN WITH MINERALS) tablet, Take 1 tablet by mouth daily., Disp: , Rfl:    omeprazole  (PRILOSEC) 40 MG capsule, TAKE 1 CAPSULE BY MOUTH EVERY DAY BEFORE BREAKFAST, Disp: 90 capsule, Rfl: 1   vitamin B-12 (CYANOCOBALAMIN) 1000 MCG tablet, Take 1,000 mcg by mouth daily., Disp: , Rfl:    vitamin C (ASCORBIC ACID) 500 MG tablet, Take 500 mg by mouth daily., Disp: , Rfl:    vitamin E 180 MG (400 UNITS) capsule, Take 400 Units by mouth daily., Disp: , Rfl:   Physical exam:  There were no vitals filed for this visit.  Physical Exam Cardiovascular:     Rate and Rhythm: Normal rate and regular rhythm.     Heart sounds: Normal heart sounds.  Pulmonary:     Effort: Pulmonary effort is normal.     Breath sounds: Normal breath sounds.  Skin:    General: Skin is warm and dry.   Neurological:     Mental Status: She is alert and oriented to person, place, and time.    Breast exam was performed in seated and lying down position. Patient is status post left lumpectomy with a well-healed surgical scar. No evidence of any palpable masses.  Left nipple is chronically inverted.  No evidence of axillary adenopathy. No evidence of any palpable masses or lumps in the right breast. No evidence of right axillary adenopathy   I have personally reviewed labs listed below:    Latest Ref Rng & Units 08/22/2023    8:25 AM  CMP  Glucose 65 - 99 mg/dL 90   BUN 7 - 25 mg/dL 20   Creatinine 9.49 - 1.05 mg/dL 9.20   Sodium 864 - 853 mmol/L 140   Potassium 3.5 - 5.3 mmol/L 4.3   Chloride 98 - 110 mmol/L 104   CO2 20 - 32 mmol/L 27   Calcium 8.6 - 10.4 mg/dL 9.5   Total Protein 6.1 - 8.1 g/dL 7.0   Total Bilirubin 0.2 - 1.2 mg/dL 0.7   AST 10 - 35 U/L 15   ALT 6 - 29 U/L 16       Latest Ref Rng & Units 08/22/2023    8:25 AM  CBC  WBC 3.8 - 10.8 Thousand/uL 5.6   Hemoglobin 11.7 - 15.5 g/dL 87.7   Hematocrit 64.9 - 45.0 % 42.7   Platelets 140 - 400 Thousand/uL 322     Assessment and plan- Patient is a 68 y.o. female with history of stage II aT2 N0 M0 ER weakly positive PR negative HER2 positive left breast cancer s/p neoadjuvant chemotherapy followed by left lumpectomy and adjuvant radiation therapy with complete pathological response.  She is here for routine follow-up visit  Clinically patient is doing well with no concerning signs and symptoms of recurrence based on today's exam.  She is not on endocrine therapy for weakly ER positive tumor.  She was supposed to get a mammogram last month but has decided to hold off for now.  I will see her back in 6 months no labs.  She has baseline microcytosis without overt anemia secondary to beta thalassemia minor   Visit Diagnosis No diagnosis found.    Dr. Annah Skene, MD, MPH [facility] [phone] 01/28/2024 10:48 PM

## 2024-01-29 ENCOUNTER — Inpatient Hospital Stay: Attending: Oncology | Admitting: Nurse Practitioner

## 2024-01-29 ENCOUNTER — Encounter: Payer: Self-pay | Admitting: Nurse Practitioner

## 2024-01-29 VITALS — BP 126/74 | HR 75 | Temp 98.4°F | Ht 63.0 in | Wt 173.8 lb

## 2024-01-29 DIAGNOSIS — Z853 Personal history of malignant neoplasm of breast: Secondary | ICD-10-CM | POA: Diagnosis not present

## 2024-01-29 DIAGNOSIS — D563 Thalassemia minor: Secondary | ICD-10-CM | POA: Insufficient documentation

## 2024-02-11 ENCOUNTER — Encounter: Payer: Self-pay | Admitting: Family Medicine

## 2024-02-13 ENCOUNTER — Ambulatory Visit

## 2024-02-13 DIAGNOSIS — Z23 Encounter for immunization: Secondary | ICD-10-CM | POA: Diagnosis not present

## 2024-03-01 ENCOUNTER — Other Ambulatory Visit: Payer: Self-pay | Admitting: Family Medicine

## 2024-03-01 DIAGNOSIS — K219 Gastro-esophageal reflux disease without esophagitis: Secondary | ICD-10-CM

## 2024-03-05 ENCOUNTER — Other Ambulatory Visit: Payer: Self-pay | Admitting: Family Medicine

## 2024-03-05 DIAGNOSIS — E66811 Obesity, class 1: Secondary | ICD-10-CM

## 2024-03-05 NOTE — Telephone Encounter (Signed)
 Requested Prescriptions  Pending Prescriptions Disp Refills   omeprazole  (PRILOSEC) 40 MG capsule [Pharmacy Med Name: OMEPRAZOLE  40MG  CAPSULES] 90 capsule 1    Sig: TAKE 1 CAPSULE BY MOUTH EVERY DAY BEFORE BREAKFAST     Gastroenterology: Proton Pump Inhibitors Passed - 03/05/2024  9:25 AM      Passed - Valid encounter within last 12 months    Recent Outpatient Visits           4 months ago Localized osteoporosis without current pathological fracture   [place] [facility] [place], Marsa PARAS, DO   6 months ago Annual physical exam    [facility] [place] Marsa PARAS, DO   7 months ago Medicare annual wellness visit, initial   [facility] [place], Marsa PARAS, [place]

## 2024-03-07 NOTE — Telephone Encounter (Signed)
 Requested medication (s) are due for refill today: yes  Requested medication (s) are on the active medication list: yes  Last refill:  11/13/23  Future visit scheduled: yes  Notes to clinic:   Medication not assigned to a protocol, review manually.      Requested Prescriptions  Pending Prescriptions Disp Refills   CONTRAVE  8-90 MG TB12 [Pharmacy Med Name: CONTRAVE  8-90mg  TAB ER] 120 tablet 1    Sig: TAKE TWO TABLETS BY MOUTH TWICE DAILY (IN THE MORNING WITH BREAKFAST AND IN THE EVENING WITH DINNER)     Off-Protocol Failed - 03/07/2024  2:41 PM      Failed - Medication not assigned to a protocol, review manually.      Passed - Valid encounter within last 12 months    Recent Outpatient Visits           4 months ago Localized osteoporosis without current pathological fracture   [place] [facility] [place], Marsa PARAS, DO   6 months ago Annual physical exam   [place] [facility] [place] Marsa PARAS, DO   7 months ago Medicare annual wellness visit, initial   [facility] [place], Marsa PARAS, [place]

## 2024-04-09 ENCOUNTER — Encounter: Payer: Self-pay | Admitting: Family Medicine

## 2024-04-17 ENCOUNTER — Telehealth: Admitting: Family Medicine

## 2024-04-17 DIAGNOSIS — E66811 Obesity, class 1: Secondary | ICD-10-CM

## 2024-04-17 DIAGNOSIS — N951 Menopausal and female climacteric states: Secondary | ICD-10-CM

## 2024-04-17 DIAGNOSIS — M810 Age-related osteoporosis without current pathological fracture: Secondary | ICD-10-CM

## 2024-04-17 DIAGNOSIS — Z853 Personal history of malignant neoplasm of breast: Secondary | ICD-10-CM

## 2024-04-17 MED ORDER — VEOZAH 45 MG PO TABS: 45.0000 mg | ORAL_TABLET | Freq: Every day | ORAL | 5 refills | Status: AC

## 2024-04-17 NOTE — Progress Notes (Signed)
 "  Subjective:    Patient ID: Wendy Caldwell, female    DOB: 10-19-1955, 69 y.o.   MRN: [id number]  Wendy Caldwell is a 69 y.o. female presenting on 04/17/2024 for Hot Flashes   Virtual / Telehealth Encounter - Video Visit via MyChart The purpose of this virtual visit is to provide medical care while limiting exposure to the novel coronavirus (COVID19) for both patient and office staff.  Consent was obtained for remote visit:  Yes.   Answered questions that patient had about telehealth interaction:  Yes.   I discussed the limitations, risks, security and privacy concerns of performing an evaluation and management service by video/telephone. I also discussed with the patient that there may be a patient responsible charge related to this service. The patient expressed understanding and agreed to proceed.  Patient Location: Home Provider Location: [facility] (Office)  Participants in virtual visit: - Patient: Lennyn Gange - CMA: Alan Fontana CMA - Provider: Dr Edman   HPI  Discussed the use of AI scribe software for clinical note transcription with the patient, who gave verbal consent to proceed.  History of Present Illness   Azlin Zilberman is a 69 year old female with a history of breast cancer who presents with hot flashes.  Vasomotor symptoms (hot flashes) - Persistent hot flashes not limited to night sweats, occurring at any time of day - Episodes last 10 to 15 minutes - No predictable pattern; may occur multiple times daily or not at all - Menopause at age 89 - No hormone replacement therapy due to breast cancer history - Estroven (OTC herbal supplement) used daily with insufficient symptom control - No prior use of non-hormonal prescription therapies for hot flashes  Osteoporosis, menopausal Followed now by [facility] Endocrinology for OP management Started Reclast Zoledronic acid infusion. Admits minor Adverse reaction to  infusion therapy - Experienced extreme coldness the first night following infusion - No recurrence of coldness since initial episode  Obesity BMI >30 Weight management and physical activity - Discontinued Contrave  this week due to lack of benefit - No regular exercise - Averages just under 5,000 steps per day       04/17/2024    5:47 PM 01/29/2024   10:35 AM 08/29/2023    2:02 PM  Depression screen PHQ 2/9  Decreased Interest 0 0 0  Down, Depressed, Hopeless 0 0 0  PHQ - 2 Score 0 0 0  Altered sleeping   0  Tired, decreased energy   0  Change in appetite   0  Feeling bad or failure about yourself    0  Trouble concentrating   0  Moving slowly or fidgety/restless   0  Suicidal thoughts   0  PHQ-9 Score   0   Difficult doing work/chores   Not difficult at all     Data saved with a previous flowsheet row definition       08/29/2023    2:02 PM  GAD 7 : Generalized Anxiety Score  Nervous, Anxious, on Edge 0   Control/stop worrying 0   Worry too much - different things 0   Trouble relaxing 0   Restless 0   Easily annoyed or irritable 0   Afraid - awful might happen 0   Total GAD 7 Score 0  Anxiety Difficulty Not difficult at all     Data saved with a previous flowsheet row definition    Social History[1]  Review of Systems Per  HPI unless specifically indicated above     Objective:    There were no vitals taken for this visit.  Wt Readings from Last 3 Encounters:  01/29/24 173 lb 12.8 oz (78.8 kg)  10/22/23 167 lb 2 oz (75.8 kg)  08/29/23 170 lb (77.1 kg)     Physical Exam  Note examination was completely remotely via video observation objective data only  Gen - well-appearing, no acute distress or apparent pain, comfortable HEENT - eyes appear clear without discharge or redness Heart/Lungs - cannot examine virtually - observed no evidence of coughing or labored breathing. Abd - cannot examine virtually  Skin - face visible today- no rash Neuro - awake,  alert, oriented Psych - not anxious appearing   Results for orders placed or performed in visit on 08/29/23  Cologuard   Collection Time: 09/04/23  9:04 AM  Result Value Ref Range   COLOGUARD Sample Could Not Be Processed 2 N/A  Cologuard   Collection Time: 09/20/23 11:06 AM  Result Value Ref Range   COLOGUARD Negative Negative      Assessment & Plan:   Problem List Items Addressed This Visit     History of cancer of left breast   Obesity (BMI 30.0-34.9)   Osteoporosis, post-menopausal   Other Visit Diagnoses       Vasomotor symptoms due to menopause    -  Primary   Relevant Medications   VEOZAH  45 MG TABS       Vasomotor symptoms of menopause Intermittent hot flashes and night sweats. Hormone replacement therapy contraindicated due to breast cancer history and age >32+ (42). Trial on oral supplement Estroven provides limited relief.  Discussed off label medications (prefers to avoid - we discussed gabapentin, SNRI, Clonidine) Discussed non hormonal brand name option w/ neurokinin B inhibitor therapy Veozah  for efficacy and minimal side effects and reviewed rare potential risk hepatotoxicity. Cost and insurance coverage are concerns.  - Continue Estroven supplement - Prescribed Veozah  45 mg once daily. - Monitor liver enzymes in three months. - Discuss cost and insurance coverage with pharmacy/insurance - Consider gabapentin or antidepressants SNRI if Veozah  is not feasible.  - Consider GYN for further discussion / evaluation and options if indicated    Obesity BMI >30 Previously on Contrave , trial for months unsuccessful weight loss, has discontinued at this time. Pausing other options for now.  Osteoporosis menopausal Followed by Endocrine  On Reclast infusion currently    No orders of the defined types were placed in this encounter.   Meds ordered this encounter  Medications   VEOZAH  45 MG TABS    Sig: Take 1 tablet (45 mg total) by mouth daily.     Dispense:  30 tablet    Refill:  5    Follow up plan: Return if symptoms worsen or fail to improve.  If successful with trial on Veozah  and we continue course, will repeat chemistry LFTs in 3 months   Patient verbalizes understanding with the above medical recommendations including the limitation of remote medical advice.  Specific follow-up and call-back criteria were given for patient to follow-up or seek medical care more urgently if needed.  Total duration of direct patient care provided via video conference: 20 minutes   Marsa Officer, DO [facility] 04/17/2024, 8:15 AM     [1]  Social History Tobacco Use   Smoking status: Former    Types: Cigarettes    Start date: 2012   Smokeless tobacco:  Never  Vaping Use   Vaping status: Never Used  Substance Use Topics   Alcohol use: Yes    Comment: occassional    Drug use: Never   "

## 2024-04-17 NOTE — Patient Instructions (Addendum)

## 2024-07-29 ENCOUNTER — Inpatient Hospital Stay: Admitting: Oncology
